# Patient Record
Sex: Male | Born: 1960 | Race: White | Hispanic: No | Marital: Married | Smoking: Never smoker
Health system: Southern US, Community
[De-identification: ages and names within clinical notes are randomized; demographics above are authoritative.]

## PROBLEM LIST (undated history)

## (undated) DIAGNOSIS — I5022 Chronic systolic (congestive) heart failure: Secondary | ICD-10-CM

## (undated) DIAGNOSIS — D649 Anemia, unspecified: Secondary | ICD-10-CM

## (undated) DIAGNOSIS — I2510 Atherosclerotic heart disease of native coronary artery without angina pectoris: Secondary | ICD-10-CM

## (undated) DIAGNOSIS — I872 Venous insufficiency (chronic) (peripheral): Secondary | ICD-10-CM

## (undated) DIAGNOSIS — I255 Ischemic cardiomyopathy: Secondary | ICD-10-CM

## (undated) DIAGNOSIS — A498 Other bacterial infections of unspecified site: Secondary | ICD-10-CM

## (undated) DIAGNOSIS — R51 Headache: Secondary | ICD-10-CM

## (undated) DIAGNOSIS — M86661 Other chronic osteomyelitis, right tibia and fibula: Secondary | ICD-10-CM

## (undated) DIAGNOSIS — IMO0001 Reserved for inherently not codable concepts without codable children: Secondary | ICD-10-CM

## (undated) DIAGNOSIS — H3520 Other non-diabetic proliferative retinopathy, unspecified eye: Secondary | ICD-10-CM

## (undated) DIAGNOSIS — K2970 Gastritis, unspecified, without bleeding: Secondary | ICD-10-CM

## (undated) DIAGNOSIS — B029 Zoster without complications: Secondary | ICD-10-CM

## (undated) DIAGNOSIS — I219 Acute myocardial infarction, unspecified: Secondary | ICD-10-CM

## (undated) DIAGNOSIS — E785 Hyperlipidemia, unspecified: Secondary | ICD-10-CM

## (undated) DIAGNOSIS — A491 Streptococcal infection, unspecified site: Secondary | ICD-10-CM

## (undated) DIAGNOSIS — R519 Headache, unspecified: Secondary | ICD-10-CM

## (undated) DIAGNOSIS — N182 Chronic kidney disease, stage 2 (mild): Secondary | ICD-10-CM

## (undated) DIAGNOSIS — I10 Essential (primary) hypertension: Secondary | ICD-10-CM

## (undated) HISTORY — PX: FRACTURE SURGERY: SHX138

## (undated) HISTORY — DX: Anemia, unspecified: D64.9

## (undated) HISTORY — DX: Other bacterial infections of unspecified site: A49.8

## (undated) HISTORY — DX: Gastritis, unspecified, without bleeding: K29.70

## (undated) HISTORY — DX: Chronic kidney disease, stage 2 (mild): N18.2

## (undated) HISTORY — DX: Essential (primary) hypertension: I10

## (undated) HISTORY — DX: Hyperlipidemia, unspecified: E78.5

## (undated) HISTORY — DX: Chronic systolic (congestive) heart failure: I50.22

## (undated) HISTORY — DX: Other chronic osteomyelitis, right tibia and fibula: M86.661

## (undated) HISTORY — DX: Ischemic cardiomyopathy: I25.5

## (undated) HISTORY — DX: Zoster without complications: B02.9

## (undated) HISTORY — DX: Venous insufficiency (chronic) (peripheral): I87.2

## (undated) HISTORY — DX: Streptococcal infection, unspecified site: A49.1

## (undated) HISTORY — PX: REFRACTIVE SURGERY: SHX103

## (undated) HISTORY — DX: Atherosclerotic heart disease of native coronary artery without angina pectoris: I25.10

---
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
## 1993-11-01 HISTORY — PX: VARICOSE VEIN SURGERY: SHX832

## 2001-11-01 DIAGNOSIS — B029 Zoster without complications: Secondary | ICD-10-CM

## 2001-11-01 HISTORY — DX: Zoster without complications: B02.9

## 2008-09-20 ENCOUNTER — Ambulatory Visit: Payer: Self-pay | Admitting: Orthopedic Surgery

## 2010-10-31 ENCOUNTER — Inpatient Hospital Stay: Payer: Self-pay | Admitting: Internal Medicine

## 2010-11-01 DIAGNOSIS — I2510 Atherosclerotic heart disease of native coronary artery without angina pectoris: Secondary | ICD-10-CM

## 2010-11-01 HISTORY — DX: Atherosclerotic heart disease of native coronary artery without angina pectoris: I25.10

## 2010-11-05 HISTORY — PX: CORONARY ARTERY BYPASS GRAFT: SHX141

## 2011-04-21 ENCOUNTER — Other Ambulatory Visit: Payer: Self-pay | Admitting: Emergency Medicine

## 2011-04-21 ENCOUNTER — Ambulatory Visit (INDEPENDENT_AMBULATORY_CARE_PROVIDER_SITE_OTHER): Payer: Private Health Insurance - Indemnity | Admitting: Internal Medicine

## 2011-04-21 ENCOUNTER — Encounter: Payer: Self-pay | Admitting: Internal Medicine

## 2011-04-21 VITALS — BP 94/58 | HR 90 | Ht 71.0 in | Wt 188.8 lb

## 2011-04-21 DIAGNOSIS — I5022 Chronic systolic (congestive) heart failure: Secondary | ICD-10-CM | POA: Insufficient documentation

## 2011-04-21 DIAGNOSIS — E785 Hyperlipidemia, unspecified: Secondary | ICD-10-CM | POA: Insufficient documentation

## 2011-04-21 DIAGNOSIS — I2510 Atherosclerotic heart disease of native coronary artery without angina pectoris: Secondary | ICD-10-CM | POA: Insufficient documentation

## 2011-04-21 DIAGNOSIS — E781 Pure hyperglyceridemia: Secondary | ICD-10-CM | POA: Insufficient documentation

## 2011-04-21 MED ORDER — SPIRONOLACTONE 25 MG PO TABS: 12.5000 mg | ORAL_TABLET | Freq: Every day | ORAL | Status: DC

## 2011-04-21 MED ORDER — SIMVASTATIN 40 MG PO TABS: 40.0000 mg | ORAL_TABLET | Freq: Every day | ORAL | Status: DC

## 2011-04-21 MED ORDER — DIGOXIN 125 MCG PO TABS: 125.0000 ug | ORAL_TABLET | Freq: Every day | ORAL | Status: DC

## 2011-04-21 NOTE — Assessment & Plan Note (Signed)
Goal LDL < 70. Continue simva.

## 2011-04-21 NOTE — Assessment & Plan Note (Addendum)
Doing well. NYHA I. Minimal volume overload. I did bedside echo myself in clinic today and EF 25-30% with mild MR. I suggested ICD but he wants to defer for now. Understands the risk of SCD. Medication titration limited by hypotension. Will add digoxin 0.125qd and also try to start spiro 12.5daily. Check BMET 1 week. See back frequently to try and titrate doses as tolerated.

## 2011-04-21 NOTE — Progress Notes (Signed)
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  Had a NSTEMI in January 2012 after presenting with bad cough. Had cath at [facility]. LAD 100% LCX 70% RCA p70%, d100%. EF 25%. Cardiac MRI: EF 15% anterior wall and inferior wall infarct with significant viability.   Had 3-v CABG on 11/05/10 with LIMA - LAD, SVG-OM2, SVG-PDA. 1/12  Since discharge doing well. 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 Did not go to cardiac rehab. Walks back and forth between 2 buildings 300 yards apart all day without problem. Can go up 32 steps to see boss without problem. Chest sore but no angina. No orthopnea or PND. No palpitations, orthopnea, PND or edema. Could not tolerate lisinopril due to symptomatic hypotension. SBP now stays in 100 range.  Takes lasix about once a week for swelling.    ROS: All other systems normal except as mentioned in HPI, past medical history and problem list.    Past Medical History  Diagnosis Date  . Coronary artery disease   . Diabetes mellitus   . Hyperlipidemia   . Ischemic cardiomyopathy   . Chronic renal insufficiency     Current Outpatient Prescriptions  Medication Sig Dispense Refill  . Acetaminophen 500 MG coapsule Take 500 mg by mouth every 4 (four) hours as needed.        Marland Kitchen aspirin 81 MG tablet Take 81 mg by mouth daily.        . furosemide (LASIX) 20 MG tablet Take 20 mg tablet as needed for increased weight gain.       Marland Kitchen glimepiride (AMARYL) 4 MG tablet 4 mg. Take two tablets by mouth daily.       . metoprolol succinate (TOPROL-XL) 25 MG 24 hr tablet Take 25 mg by mouth daily.        . pseudoephedrine (SUDAFED) 30 MG tablet 30 mg. Take one tablet by mouth twice a day as needed.       . simvastatin (ZOCOR) 40 MG tablet Take 40 mg by mouth at bedtime.          No Known Allergies  History   Social History  . Marital Status: Married   Spouse Name: N/A    Number of Children: N/A  . Years of Education: N/A   Occupational History  . Not on file.   Social History Main Topics  . Smoking status: Never Smoker   . Smokeless tobacco: Never Used  . Alcohol Use: No  . Drug Use: No  . Sexually Active: Not on file   Other Topics Concern  . Not on file   Social History Narrative  . No narrative on file    Family History  Problem Relation Age of Onset  . Hypertension Father     PHYSICAL EXAM: Filed Vitals:   04/21/11 1624  BP: 102/67  Pulse: 90   General:  Well appearing. No respiratory difficulty HEENT: normal Neck: supple. no JVD. Carotids 2+ bilat; no bruits. No lymphadenopathy or thryomegaly appreciated. Cor: PMI nondisplaced. Regular rate & rhythm. No rubs, gallops or murmurs. Lungs: clear Abdomen: soft, nontender, nondistended. No hepatosplenomegaly. No bruits or masses. Good bowel sounds. Extremities: no cyanosis, clubbing, rash, 1-2+ edema + scars from healed ulcers on right Neuro: alert & oriented x 3, cranial nerves grossly intact. moves all 4 extremities w/o difficulty. Odd affect  ECG: NSR  90. TWI I,L mild ST scooping laterally  ASSESSMENT & PLAN

## 2011-04-21 NOTE — Patient Instructions (Addendum)
Start new medications: Spironolactone 25mg  1/2 tablet once daily, Digoxin 0.125mg  1 tablet once daily. Follow-up in 1 month

## 2011-04-21 NOTE — Assessment & Plan Note (Signed)
No evidence of ischemia. Continue current regimen.   

## 2011-05-03 ENCOUNTER — Encounter: Payer: Self-pay | Admitting: Internal Medicine

## 2011-05-24 ENCOUNTER — Ambulatory Visit: Payer: Private Health Insurance - Indemnity | Admitting: Physician Assistant

## 2011-06-09 ENCOUNTER — Ambulatory Visit (INDEPENDENT_AMBULATORY_CARE_PROVIDER_SITE_OTHER): Payer: Private Health Insurance - Indemnity | Admitting: Internal Medicine

## 2011-06-09 ENCOUNTER — Encounter: Payer: Self-pay | Admitting: Internal Medicine

## 2011-06-09 DIAGNOSIS — I2510 Atherosclerotic heart disease of native coronary artery without angina pectoris: Secondary | ICD-10-CM

## 2011-06-09 DIAGNOSIS — I5022 Chronic systolic (congestive) heart failure: Secondary | ICD-10-CM

## 2011-06-09 MED ORDER — METOPROLOL SUCCINATE ER 50 MG PO TB24: 50.0000 mg | ORAL_TABLET | Freq: Every day | ORAL | Status: DC

## 2011-06-09 NOTE — Assessment & Plan Note (Signed)
No evidence of ischemia. Continue current regimen.   

## 2011-06-09 NOTE — Assessment & Plan Note (Addendum)
Doing well. NYHA I.   EF 25-30% with mild MR. I suggested ICD but hecontinues to defer for now. Understands the risk of SCD.Increase Metoprolol XL 25mg  BID. Increase See back frequently to try and titrate doses as tolerated.

## 2011-06-09 NOTE — Patient Instructions (Signed)
Increase Toprol to 50 mg every evening  Your physician has requested that you have an echocardiogram. Echocardiography is a painless test that uses sound waves to create images of your heart. It provides your doctor with information about the size and shape of your heart and how well your heart's chambers and valves are working. This procedure takes approximately one hour. There are no restrictions for this procedure.  In 6 weeks  Your physician recommends that you schedule a follow-up appointment in: 6 weeks in Heart Failure clinic

## 2011-06-09 NOTE — Progress Notes (Signed)
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  Had a NSTEMI in January 2012 after presenting with bad cough. Had cath at [facility]. LAD 100% LCX 70% RCA p70%, d100%. EF 25%. Cardiac MRI: EF 15% anterior wall and inferior wall infarct with significant viability.   Had 3-v CABG on 11/05/10 with LIMA - LAD, SVG-OM2, SVG-PDA. 1/12  I saw him in [place] office in June 2012. Quick-look echo with persistent LV dysfunction EF 25%.  He is doing well no complaints. 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 Goes back and forth between 2 buildings 300 yards apart all day without problem. Can go up 32 steps to see boss without problem. No has mild soreness from CABG. No orthopnea or PND. No palpitations, orthopnea, PND or edema. Could not tolerate lisinopril due to symptomatic hypotension. He has not required extra Lasix because his weight has been stable and he has not had any swelling. Follows low salt diet 05-03-2011  K 4.8 BUN 31Creat1.09   ROS: All other systems normal except as mentioned in HPI, past medical history and problem list.    Past Medical History  Diagnosis Date  . Coronary artery disease   . Diabetes mellitus   . Hyperlipidemia   . Ischemic cardiomyopathy   . Chronic renal insufficiency     Current Outpatient Prescriptions  Medication Sig Dispense Refill  . Acetaminophen 500 MG coapsule Take 500 mg by mouth every 4 (four) hours as needed.        Marland Kitchen aspirin 81 MG tablet Take 81 mg by mouth daily.        . digoxin (LANOXIN) 0.125 MG tablet Take 1 tablet (125 mcg total) by mouth daily.  30 tablet  6  . furosemide (LASIX) 20 MG tablet Take 20 mg tablet as needed for increased weight gain.       Marland Kitchen glimepiride (AMARYL) 4 MG tablet 4 mg. Take two tablets by mouth daily.       . metoprolol succinate (TOPROL-XL) 25 MG 24  hr tablet Take 25 mg by mouth daily.        Marland Kitchen NAPROXEN PO Take by mouth as needed.        . pseudoephedrine (SUDAFED) 30 MG tablet 30 mg. Take one tablet by mouth twice a day as needed.       . simvastatin (ZOCOR) 40 MG tablet Take 1 tablet (40 mg total) by mouth at bedtime.  30 tablet  6  . spironolactone (ALDACTONE) 25 MG tablet Take 0.5 tablets (12.5 mg total) by mouth daily.  30 tablet  6    No Known Allergies  History   Social History  . Marital Status: Married    Spouse Name: N/A    Number of Children: N/A  . Years of Education: N/A   Occupational History  . Not on file.   Social History Main Topics  . Smoking status: Never Smoker   . Smokeless tobacco: Never Used  . Alcohol Use: No  . Drug Use: No  . Sexually Active: Not on file   Other Topics Concern  . Not on file   Social History Narrative  . No narrative on file    Family History  Problem Relation Age of Onset  . Hypertension Father     PHYSICAL EXAM:  Filed Vitals:   06/09/11 1603  BP: 142/68  Pulse: 80   General:  Well appearing. No respiratory difficulty HEENT: normal Neck: supple. JVP 5. Carotids 2+ bilat; no bruits. No lymphadenopathy or thryomegaly appreciated. Cor: PMI nondisplaced. Regular rate & rhythm. No rubs, gallops or murmurs. Lungs: clear Abdomen: soft, nontender, nondistended. No hepatosplenomegaly. No bruits or masses. Good bowel sounds. Extremities: no cyanosis, clubbing, rash,RLE  LLE1-2+ edema + scars from healed ulcers on right Neuro: alert & oriented x 3, cranial nerves grossly intact. moves all 4 extremities w/o difficulty. Odd affect  ECG:   ASSESSMENT & PLAN

## 2011-06-21 ENCOUNTER — Other Ambulatory Visit (HOSPITAL_COMMUNITY): Payer: Private Health Insurance - Indemnity

## 2011-07-22 ENCOUNTER — Ambulatory Visit (HOSPITAL_COMMUNITY)
Admission: RE | Admit: 2011-07-22 | Discharge: 2011-07-22 | Disposition: A | Discharge status: Home / Self Care | Payer: Private Health Insurance - Indemnity | Source: Ambulatory Visit | Attending: Internal Medicine | Admitting: Internal Medicine

## 2011-07-22 VITALS — BP 112/66 | HR 65 | Wt 186.5 lb

## 2011-07-22 DIAGNOSIS — I079 Rheumatic tricuspid valve disease, unspecified: Secondary | ICD-10-CM | POA: Insufficient documentation

## 2011-07-22 DIAGNOSIS — I252 Old myocardial infarction: Secondary | ICD-10-CM | POA: Insufficient documentation

## 2011-07-22 DIAGNOSIS — E119 Type 2 diabetes mellitus without complications: Secondary | ICD-10-CM | POA: Insufficient documentation

## 2011-07-22 DIAGNOSIS — I509 Heart failure, unspecified: Secondary | ICD-10-CM

## 2011-07-22 DIAGNOSIS — E785 Hyperlipidemia, unspecified: Secondary | ICD-10-CM | POA: Insufficient documentation

## 2011-07-22 DIAGNOSIS — I5022 Chronic systolic (congestive) heart failure: Secondary | ICD-10-CM | POA: Insufficient documentation

## 2011-07-22 MED ORDER — LISINOPRIL 2.5 MG PO TABS: 2.5000 mg | ORAL_TABLET | Freq: Every day | ORAL | Status: DC

## 2011-07-22 NOTE — Assessment & Plan Note (Addendum)
Doing very well. NYHA II. Volume status looks good. Will try to reinitiate low-dose lisinopril at 2.5 daily. Reviewed use of sliding scale lasix.

## 2011-07-22 NOTE — Progress Notes (Signed)
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  Had a NSTEMI in January 2012 after presenting with bad cough. Had cath at [facility]. LAD 100% LCX 70% RCA p70%, d100%. EF 25%. Cardiac MRI: EF 15% anterior wall and inferior wall infarct with significant viability.   Had 3-v CABG on 11/05/10 with LIMA - LAD, SVG-OM2, SVG-PDA. 1/12  I saw him in [place] office in June 2012. Quick-look echo with persistent LV dysfunction EF 25%.  At last visit increased Toprol 50. Tolerating well.   Returns today for routine f/u. Echo today which I reviewed shows EF 25%.   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 Goes back and forth between 2 buildings 300 yards apart all day without problem. Can go up 32 steps to see boss without problem. No orthopnea or PND. No palpitations, orthopnea, PND or edema. Could not tolerate lisinopril due to symptomatic hypotension. He has not required extra Lasix because his weight has been stable and he has not had any swelling. Follows low salt diet. No dizziness at all.    ROS: All other systems normal except as mentioned in HPI, past medical history and problem list.    Past Medical History  Diagnosis Date  . Coronary artery disease   . Diabetes mellitus   . Hyperlipidemia   . Ischemic cardiomyopathy   . Chronic renal insufficiency     Current Outpatient Prescriptions  Medication Sig Dispense Refill  . Acetaminophen 500 MG coapsule Take 500 mg by mouth every 4 (four) hours as needed.        Marland Kitchen aspirin 81 MG tablet Take 81 mg by mouth daily.        . digoxin (LANOXIN) 0.125 MG tablet Take 1 tablet (125 mcg total) by mouth daily.  30 tablet  6  . furosemide (LASIX) 20 MG tablet Take 20 mg tablet as needed for increased weight gain.       Marland Kitchen glimepiride (AMARYL) 4 MG tablet 4 mg. Take two  tablets by mouth daily.       . metoprolol succinate (TOPROL-XL) 50 MG 24 hr tablet Take 1 tablet (50 mg total) by mouth daily.  30 tablet  6  . NAPROXEN PO Take by mouth as needed.        . pseudoephedrine (SUDAFED) 30 MG tablet 30 mg. Take one tablet by mouth twice a day as needed.       . simvastatin (ZOCOR) 40 MG tablet Take 1 tablet (40 mg total) by mouth at bedtime.  30 tablet  6  . spironolactone (ALDACTONE) 25 MG tablet Take 0.5 tablets (12.5 mg total) by mouth daily.  30 tablet  6    No Known Allergies  History   Social History  . Marital Status: Married    Spouse Name: N/A    Number of Children: N/A  . Years of Education: N/A   Occupational History  . Not on file.   Social History Main Topics  . Smoking status: Never Smoker   . Smokeless tobacco: Never Used  . Alcohol Use: No  . Drug Use: No  . Sexually Active: Not on file   Other Topics Concern  . Not on file   Social History Narrative  . No narrative on file    Family History  Problem Relation Age of Onset  . Hypertension Father     PHYSICAL EXAM: Filed Vitals:   07/22/11 1112  BP: 112/66  Pulse: 65   General:  Well appearing. No respiratory difficulty HEENT: normal Neck: supple. JVP 5. Carotids 2+ bilat; no bruits. No lymphadenopathy or thryomegaly appreciated. Cor: PMI nondisplaced. Regular rate & rhythm. No rubs, gallops or murmurs. Lungs: clear Abdomen: soft, nontender, nondistended. No hepatosplenomegaly. No bruits or masses. Good bowel sounds. Extremities: no cyanosis, clubbing, rash,RLE  LLE1-2+ edema + scars from healed ulcers on right Neuro: alert & oriented x 3, cranial nerves grossly intact. moves all 4 extremities w/o difficulty. Odd affect    ASSESSMENT & PLAN

## 2011-07-22 NOTE — Patient Instructions (Signed)
Start Lisinopril 2.5 mg daily  Your physician recommends that you return for lab work in: 2 weeks (bmet)  Your physician recommends that you schedule a follow-up appointment in: 2 months.

## 2011-09-06 ENCOUNTER — Encounter: Payer: Self-pay | Admitting: Internal Medicine

## 2011-09-30 ENCOUNTER — Ambulatory Visit (HOSPITAL_COMMUNITY)
Admission: RE | Admit: 2011-09-30 | Discharge: 2011-09-30 | Disposition: A | Discharge status: Home / Self Care | Payer: Private Health Insurance - Indemnity | Source: Ambulatory Visit | Attending: Internal Medicine | Admitting: Internal Medicine

## 2011-09-30 DIAGNOSIS — I5022 Chronic systolic (congestive) heart failure: Secondary | ICD-10-CM | POA: Insufficient documentation

## 2011-09-30 NOTE — Patient Instructions (Signed)
Follow in 3 months for ECHO and appointment  Weigh yourself EVERY morning after you go to the bathroom but before you eat or drink anything. Write this number down in a weight log/diary. If you gain 3 pounds overnight or 5 pounds in a week, call the heart failure clinic

## 2011-09-30 NOTE — Assessment & Plan Note (Addendum)
NYHA I. Volume status stable. He declines defibrillator.Intolerant ACE inhibitor.  Will need formal ECHO in 3 months.  Follow up in 3 months.

## 2011-09-30 NOTE — Progress Notes (Signed)
Patient ID: Vincent Brown, male   DOB: June 09, 1961, 50 y.o.   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  Had a NSTEMI in January 2012 after presenting with bad cough. Had cath at [facility]. LAD 100% LCX 70% RCA p70%, d100%. EF 25%. Cardiac MRI: EF 15% anterior wall and inferior wall infarct with significant viability.   Had 3-v CABG on 11/05/10 with LIMA - LAD, SVG-OM2, SVG-PDA. 1/12  I saw him in [place] office in June 2012. Quick-look echo with persistent LV dysfunction EF 25%.  He is here for follow up . Denies SOB/PND/Orthpnea. Last visit started Lisinopril but stopped after a week and because he felt like he staggered. Tried to take Lisinopril at night.   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 Goes back and forth between 2 buildings 300 yards apart all day without problem. Can go up 32 steps to see boss without problem. He takes Lasix about once a month.  Follows low salt diet. Chronic lower extremity edema R> L. Declines defibrillator.     ROS: All other systems normal except as mentioned in HPI, past medical history and problem list.    Past Medical History  Diagnosis Date  . Coronary artery disease   . Diabetes mellitus   . Hyperlipidemia   . Ischemic cardiomyopathy   . Chronic renal insufficiency     Current Outpatient Prescriptions  Medication Sig Dispense Refill  . Acetaminophen 500 MG coapsule Take 500 mg by mouth every 4 (four) hours as needed.        Marland Kitchen aspirin 81 MG tablet Take 81 mg by mouth daily.        . digoxin (LANOXIN) 0.125 MG tablet Take 1 tablet (125 mcg total) by mouth daily.  30 tablet  6  . furosemide (LASIX) 20 MG tablet Take 20 mg tablet as needed for increased weight gain.       Marland Kitchen glimepiride (AMARYL) 4 MG tablet Take two tablets by mouth daily.      . metoprolol succinate (TOPROL-XL) 50 MG 24  hr tablet Take 1 tablet (50 mg total) by mouth daily.  30 tablet  6  . NAPROXEN PO Take by mouth as needed.        . pseudoephedrine (SUDAFED) 30 MG tablet 30 mg. Take one tablet by mouth twice a day as needed.       . simvastatin (ZOCOR) 40 MG tablet Take 1 tablet (40 mg total) by mouth at bedtime.  30 tablet  6  . spironolactone (ALDACTONE) 25 MG tablet Take 0.5 tablets (12.5 mg total) by mouth daily.  30 tablet  6    No Known Allergies  History   Social History  . Marital Status: Married    Spouse Name: N/A    Number of Children: N/A  . Years of Education: N/A   Occupational History  . Not on file.   Social History Main Topics  . Smoking status: Never Smoker   . Smokeless tobacco: Never Used  . Alcohol Use: No  . Drug Use: No  . Sexually Active: Not on file   Other Topics Concern  . Not on file   Social History Narrative  . No narrative on file    Family History  Problem Relation Age of Onset  . Hypertension Father  PHYSICAL EXAM: Filed Vitals:   09/30/11 1544  BP: 106/58  Pulse: 70   General:  Well appearing. No respiratory difficulty HEENT: normal Neck: supple. JVP 6-7. Carotids 2+ bilat; no bruits. No lymphadenopathy or thryomegaly appreciated. Cor: PMI nondisplaced. Regular rate & rhythm. No rubs, gallops or murmurs. Lungs: clear Abdomen: soft, nontender, nondistended. No hepatosplenomegaly. No bruits or masses. Good bowel sounds. Extremities: no cyanosis, clubbing, rash,RLE  LLE1 edema + scars from healed ulcers on right Neuro: alert & oriented x 3, cranial nerves grossly intact. moves all 4 extremities w/o difficulty. Odd affect    ASSESSMENT & PLAN

## 2011-10-01 NOTE — Progress Notes (Signed)
Patient seen and examined with Amy Clegg, NP. We discussed all aspects of the encounter. I agree with the assessment and plan as stated above.   

## 2011-11-25 ENCOUNTER — Other Ambulatory Visit: Payer: Self-pay | Admitting: Internal Medicine

## 2012-01-20 ENCOUNTER — Ambulatory Visit (HOSPITAL_COMMUNITY)
Admission: RE | Admit: 2012-01-20 | Discharge: 2012-01-20 | Disposition: A | Discharge status: Home / Self Care | Payer: Private Health Insurance - Indemnity | Source: Ambulatory Visit | Attending: Internal Medicine | Admitting: Internal Medicine

## 2012-01-20 VITALS — BP 116/56 | HR 80 | Wt 181.0 lb

## 2012-01-20 DIAGNOSIS — I509 Heart failure, unspecified: Secondary | ICD-10-CM | POA: Insufficient documentation

## 2012-01-20 DIAGNOSIS — E119 Type 2 diabetes mellitus without complications: Secondary | ICD-10-CM | POA: Insufficient documentation

## 2012-01-20 DIAGNOSIS — I5022 Chronic systolic (congestive) heart failure: Secondary | ICD-10-CM

## 2012-01-20 DIAGNOSIS — I369 Nonrheumatic tricuspid valve disorder, unspecified: Secondary | ICD-10-CM

## 2012-01-20 DIAGNOSIS — E785 Hyperlipidemia, unspecified: Secondary | ICD-10-CM | POA: Insufficient documentation

## 2012-01-20 MED ORDER — SPIRONOLACTONE 25 MG PO TABS: 12.5000 mg | ORAL_TABLET | Freq: Every day | ORAL | Status: DC

## 2012-01-20 MED ORDER — METOPROLOL SUCCINATE ER 50 MG PO TB24: ORAL_TABLET | ORAL | Status: DC

## 2012-01-20 MED ORDER — DIGOXIN 125 MCG PO TABS: 125.0000 ug | ORAL_TABLET | Freq: Every day | ORAL | Status: DC

## 2012-01-20 MED ORDER — SIMVASTATIN 40 MG PO TABS: 40.0000 mg | ORAL_TABLET | Freq: Every day | ORAL | Status: DC

## 2012-01-20 MED ORDER — FUROSEMIDE 20 MG PO TABS: ORAL_TABLET | ORAL | Status: DC

## 2012-01-20 NOTE — Assessment & Plan Note (Signed)
No evidence of ischemia. Continue current regimen.   

## 2012-01-20 NOTE — Progress Notes (Signed)
  Echocardiogram 2D Echocardiogram has been performed.  Cathie Beams Deneen 01/20/2012, 3:21 PM

## 2012-01-20 NOTE — Patient Instructions (Signed)
Increase metoprolol to 75 mg daily (1.5 tabs)  Follow up 6 months.  Do the following things EVERYDAY: 1) Weigh yourself in the morning before breakfast. Write it down and keep it in a log. 2) Take your medicines as prescribed 3) Eat low salt foods--Limit salt (sodium) to 2000mg  per day.  4) Stay as active as you can everyday

## 2012-01-20 NOTE — Progress Notes (Signed)
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  Had a NSTEMI in January 2012 after presenting with bad cough. Had cath at [facility]. LAD 100% LCX 70% RCA p70%, d100%. EF 25%. Cardiac MRI: EF 15% anterior wall and inferior wall infarct with significant viability.   Had 3-V CABG on 11/05/10 with LIMA - LAD, SVG-OM2, SVG-PDA.   I saw him in [place] office in June 2012. Quick-look echo with persistent LV dysfunction EF 25%.  Unable to tolerate lisinopril due to dizziness.  He is here for follow up. He has been doing well.  Denies SOB/orthopnea/PND.  Takes lasix about once every week or so.  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  Follows a low salt diet, doing well with it right now.  Continues to decline defib.  Reviewed Echo today, preliminary report EF 30-35%   ROS: All other systems normal except as mentioned in HPI, past medical history and problem list.    Past Medical History  Diagnosis Date  . Coronary artery disease   . Diabetes mellitus   . Hyperlipidemia   . Ischemic cardiomyopathy   . Chronic renal insufficiency     Current Outpatient Prescriptions  Medication Sig Dispense Refill  . Acetaminophen 500 MG coapsule Take 500 mg by mouth every 4 (four) hours as needed.        Marland Kitchen aspirin 81 MG tablet Take 81 mg by mouth daily.        . digoxin (LANOXIN) 0.125 MG tablet take 1 tablet by mouth once daily  30 tablet  6  . furosemide (LASIX) 20 MG tablet Take 20 mg tablet as needed for increased weight gain.       Marland Kitchen glimepiride (AMARYL) 4 MG tablet Take two tablets by mouth daily.      . metoprolol succinate (TOPROL-XL) 50 MG 24 hr tablet Take 1 tablet (50 mg total) by mouth daily.  30 tablet  6  . NAPROXEN PO Take by mouth as needed.        . simvastatin (ZOCOR) 40 MG tablet Take 1 tablet (40 mg total) by mouth at bedtime.  30 tablet  6  . spironolactone  (ALDACTONE) 25 MG tablet Take 0.5 tablets (12.5 mg total) by mouth daily.  30 tablet  6    No Known Allergies   PHYSICAL EXAM: Filed Vitals:   01/20/12 1534  BP: 116/56  Pulse: 80  Weight: 181 lb (82.101 kg)  SpO2: 97%   General:  Well appearing. No respiratory difficulty HEENT: normal Neck: supple. JVP 6-7. Carotids 2+ bilat; no bruits. No lymphadenopathy or thryomegaly appreciated. Cor: PMI nondisplaced. Regular rate & rhythm. No rubs, gallops or murmurs. Lungs: clear Abdomen: soft, nontender, nondistended. No hepatosplenomegaly. No bruits or masses. Good bowel sounds. Extremities: no cyanosis, clubbing, rash, trace bilateral LE edema.   Neuro: alert & oriented x 3, cranial nerves grossly intact. moves all 4 extremities w/o difficulty. Odd affect    ASSESSMENT & PLAN

## 2012-01-20 NOTE — Assessment & Plan Note (Addendum)
NYHA I. Volume status looks good today.  Reviewed echo results during the office visit today.  His LVEF remains low 30-35%.  Discussed risk/benefits and indications for ICD placement but he again declines.  Will not add ACE-I due to intolerance.  Will increase Toprol to 75 mg daily.  Follow up 6 months.   Patient seen and examined with Ulyess Blossom PA-C. We discussed all aspects of the encounter. I agree with the assessment and plan as stated above.  Remains fairly active. Looks good on exam. Refuses ICD, re-attempt at ACE-I or f/u closer than 6 months despite our requests to the contrary. Will see back in 6 months. Knows to call us if he is having a problem.

## 2012-09-26 ENCOUNTER — Ambulatory Visit (HOSPITAL_COMMUNITY): Payer: Private Health Insurance - Indemnity

## 2012-10-01 HISTORY — PX: COLONOSCOPY: SHX174

## 2012-10-09 ENCOUNTER — Encounter (HOSPITAL_COMMUNITY): Payer: Self-pay

## 2012-10-09 ENCOUNTER — Ambulatory Visit (HOSPITAL_COMMUNITY)
Admission: RE | Admit: 2012-10-09 | Discharge: 2012-10-09 | Disposition: A | Discharge status: Home / Self Care | Payer: Private Health Insurance - Indemnity | Source: Ambulatory Visit | Attending: Internal Medicine | Admitting: Internal Medicine

## 2012-10-09 VITALS — BP 112/58 | HR 67 | Ht 71.0 in | Wt 170.8 lb

## 2012-10-09 DIAGNOSIS — I5022 Chronic systolic (congestive) heart failure: Secondary | ICD-10-CM | POA: Insufficient documentation

## 2012-10-09 DIAGNOSIS — I2510 Atherosclerotic heart disease of native coronary artery without angina pectoris: Secondary | ICD-10-CM

## 2012-10-09 DIAGNOSIS — Z01810 Encounter for preprocedural cardiovascular examination: Secondary | ICD-10-CM

## 2012-10-09 LAB — BASIC METABOLIC PANEL
BUN: 24 mg/dL — ABNORMAL HIGH (ref 6–23)
GFR calc Af Amer: >90 mL/min (ref >90)
GFR calc non Af Amer: >90 mL/min (ref >90)
Potassium: 4.8 mEq/L (ref 3.5–5.1)
Sodium: 135 mEq/L (ref 135–145)

## 2012-10-09 MED ORDER — SPIRONOLACTONE 25 MG PO TABS: 25.0000 mg | ORAL_TABLET | Freq: Every day | ORAL | Status: DC

## 2012-10-09 MED ORDER — FUROSEMIDE 20 MG PO TABS: ORAL_TABLET | ORAL | Status: DC

## 2012-10-09 MED ORDER — SIMVASTATIN 40 MG PO TABS: 40.0000 mg | ORAL_TABLET | Freq: Every day | ORAL | Status: DC

## 2012-10-09 MED ORDER — METOPROLOL SUCCINATE ER 50 MG PO TB24: ORAL_TABLET | ORAL | Status: DC

## 2012-10-09 NOTE — Patient Instructions (Addendum)
Continue taking medications as prescribed.  Follow up April 2014, with ECHO  Will call with lab results.  Stop taking Digoxin and increase Spironolactone to 25 mg daily.  Call any issues

## 2012-10-09 NOTE — Assessment & Plan Note (Signed)
Given well preserved functional capacity, colonoscopy likely low risk. OK to proceed without further cardiac work-up. Watch volume status peri-procedure.

## 2012-10-09 NOTE — Progress Notes (Signed)
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  Had a NSTEMI in January 2012 after presenting with bad cough. Had cath at [facility]. LAD 100% LCX 70% RCA p70%, d100%. EF 25%. Cardiac MRI: EF 15% anterior wall and inferior wall infarct with significant viability.   Had 3-V CABG on 11/05/10 with LIMA - LAD, SVG-OM2, SVG-PDA.   Unable to tolerate lisinopril due to dizziness.  ECHO 12/2011: EF 25-30%  Follow up: Last visit increased his Toprol to 75 mg daily and has tolerated well. Denies SOB/orthopnea/PND. Reports LE edema every once in awhile. 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 Reports he gets SOB and fatigued after moving 100 lb bales of hay, but after resting a few minutes he is ok. Eats low salt, low fat, and low sugar diet. Taking medications as prescribed and weight at home 170-172 lbs. Having colonoscopy 12/30 and needs cardiac clearance.     ROS: All other systems normal except as mentioned in HPI, past medical history and problem list.    Past Medical History  Diagnosis Date  . Coronary artery disease   . Diabetes mellitus   . Hyperlipidemia   . Ischemic cardiomyopathy   . Chronic renal insufficiency     Current Outpatient Prescriptions  Medication Sig Dispense Refill  . Acetaminophen 500 MG coapsule Take 500 mg by mouth every 4 (four) hours as needed.        Marland Kitchen aspirin 81 MG tablet Take 81 mg by mouth daily.        . digoxin (LANOXIN) 0.125 MG tablet Take 1 tablet (125 mcg total) by mouth daily.  30 tablet  6  . furosemide (LASIX) 20 MG tablet Take 20 mg tablet as needed for increased weight gain.  20 tablet  6  . glimepiride (AMARYL) 4 MG tablet Take two tablets by mouth daily.      . metoprolol succinate (TOPROL-XL) 50 MG 24 hr tablet Take 75 mg (1.5 tabs) daily.  45 tablet  6  . NAPROXEN PO Take by mouth  as needed.        . simvastatin (ZOCOR) 40 MG tablet Take 1 tablet (40 mg total) by mouth at bedtime.  30 tablet  6  . spironolactone (ALDACTONE) 25 MG tablet Take 0.5 tablets (12.5 mg total) by mouth daily.  30 tablet  6    No Known Allergies   PHYSICAL EXAM: Filed Vitals:   10/09/12 0856  BP: 112/58  Pulse: 67  Height: 5\' 11"  (1.803 m)  Weight: 170 lb 12.8 oz (77.474 kg)  SpO2: 97%   General:  Well appearing. No respiratory difficulty HEENT: normal Neck: supple. JVP 7-8. Carotids 2+ bilat; no bruits. No lymphadenopathy or thryomegaly appreciated. Cor: PMI nondisplaced. Regular rate & rhythm., No rubs, gallops or murmurs Lungs: CTA all lung fields Abdomen: soft, nontender, nondistended. No hepatosplenomegaly. No bruits or masses. Good bowel sounds. Extremities: no cyanosis, clubbing, rash, trace bilateral LE edema.   Neuro: alert & oriented x 3, cranial nerves grossly intact. moves all 4 extremities w/o difficulty.    ASSESSMENT & PLAN

## 2012-10-09 NOTE — Assessment & Plan Note (Signed)
[name], NP. We discussed all aspects of the encounter. I agree with the assessment and plan as stated above.   He is doing well. NYHA I. Volume status looks good. Unable to tolerate ACE-I in past. Refuses ICD or closer f/u. Will increase spiro to 25 daily. Check labs today. Can stop digoxin.

## 2012-10-09 NOTE — Assessment & Plan Note (Signed)
No evidence of ischemia. Continue current regimen.   

## 2012-10-30 ENCOUNTER — Ambulatory Visit: Payer: Self-pay | Admitting: Unknown Physician Specialty

## 2013-02-02 ENCOUNTER — Telehealth (HOSPITAL_COMMUNITY): Payer: Self-pay | Admitting: Cardiology

## 2013-02-02 DIAGNOSIS — I5022 Chronic systolic (congestive) heart failure: Secondary | ICD-10-CM

## 2013-02-02 NOTE — Telephone Encounter (Signed)
Orders placed for upcoming echo..cmj

## 2013-03-14 ENCOUNTER — Encounter (HOSPITAL_COMMUNITY): Payer: Self-pay

## 2013-03-14 ENCOUNTER — Ambulatory Visit (HOSPITAL_COMMUNITY)
Admission: RE | Admit: 2013-03-14 | Discharge: 2013-03-14 | Disposition: A | Discharge status: Home / Self Care | Payer: Managed Care, Other (non HMO) | Source: Ambulatory Visit | Attending: Family Medicine | Admitting: Family Medicine

## 2013-03-14 ENCOUNTER — Ambulatory Visit (HOSPITAL_BASED_OUTPATIENT_CLINIC_OR_DEPARTMENT_OTHER)
Admission: RE | Admit: 2013-03-14 | Discharge: 2013-03-14 | Disposition: A | Discharge status: Home / Self Care | Payer: Managed Care, Other (non HMO) | Source: Ambulatory Visit | Attending: Internal Medicine | Admitting: Internal Medicine

## 2013-03-14 VITALS — BP 102/64 | HR 64 | Ht 71.0 in | Wt 167.8 lb

## 2013-03-14 DIAGNOSIS — I369 Nonrheumatic tricuspid valve disorder, unspecified: Secondary | ICD-10-CM

## 2013-03-14 DIAGNOSIS — I5022 Chronic systolic (congestive) heart failure: Secondary | ICD-10-CM

## 2013-03-14 DIAGNOSIS — I509 Heart failure, unspecified: Secondary | ICD-10-CM | POA: Insufficient documentation

## 2013-03-14 DIAGNOSIS — I379 Nonrheumatic pulmonary valve disorder, unspecified: Secondary | ICD-10-CM | POA: Insufficient documentation

## 2013-03-14 DIAGNOSIS — I079 Rheumatic tricuspid valve disease, unspecified: Secondary | ICD-10-CM | POA: Insufficient documentation

## 2013-03-14 DIAGNOSIS — I059 Rheumatic mitral valve disease, unspecified: Secondary | ICD-10-CM | POA: Insufficient documentation

## 2013-03-14 NOTE — Patient Instructions (Addendum)
Follow up in 6 months  Do the following things EVERYDAY: 1) Weigh yourself in the morning before breakfast. Write it down and keep it in a log. 2) Take your medicines as prescribed 3) Eat low salt foods-Limit salt (sodium) to 2000 mg per day.  4) Stay as active as you can everyday 5) Limit all fluids for the day to less than 2 liters 

## 2013-03-14 NOTE — Progress Notes (Signed)
Patient ID: Vincent Brown, male   DOB: [birth year], 52 y.o.   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 Had cath at [facility]. LAD 100% LCX 70% RCA p70%, d100%. EF 25%. Cardiac MRI: EF 15% anterior wall and inferior wall infarct with significant viability.   Had 3-V CABG on 11/05/10 with LIMA - LAD, SVG-OM2, SVG-PDA.   Unable to tolerate lisinopril due to dizziness.  ECHO 12/2011: EF 25-30% ECHO 03/14/13 EF 25%  He returns for follow up. Denies SOB/PND/Orthopnea/CP. Continues to work full time at [employer]. Compliant with medications. Weight at home 170 pounds. He only takes lasix as needed. Usually takes lasix once a week. Unable to tolerate lisinopril due to dizziness  03/10/13 Creatinine 1.13 Potassium 4.0  Cholesterol 105 HDL 38 Triglyceride 59 LDL 55    ROS: All other systems normal except as mentioned in HPI, past medical history and problem list.    Past Medical History  Diagnosis Date  . Coronary artery disease   . Diabetes mellitus   . Hyperlipidemia   . Ischemic cardiomyopathy   . Chronic renal insufficiency     Current Outpatient Prescriptions  Medication Sig Dispense Refill  . Acetaminophen 500 MG coapsule Take 500 mg by mouth every 4 (four) hours as needed.        Marland Kitchen aspirin 81 MG tablet Take 81 mg by mouth daily.        . furosemide (LASIX) 20 MG tablet Take 20 mg tablet as needed for increased weight gain.  20 tablet  6  . glimepiride (AMARYL) 4 MG tablet Take two tablets by mouth daily.      . metoprolol succinate (TOPROL-XL) 50 MG 24 hr tablet Take 75 mg (1.5 tabs) daily.  45 tablet  6  . NAPROXEN PO Take by mouth as needed.        . simvastatin (ZOCOR) 40 MG tablet Take 1 tablet (40 mg total) by mouth at bedtime.  30 tablet  6  . spironolactone  (ALDACTONE) 25 MG tablet Take 1 tablet (25 mg total) by mouth daily.  30 tablet  6   No current facility-administered medications for this encounter.    No Known Allergies   PHYSICAL EXAM: Filed Vitals:   03/14/13 0925  BP: 102/64  Pulse: 64  Height: 5\' 11"  (1.803 m)  Weight: 167 lb 12.8 oz (76.114 kg)   General:  Well appearing. No respiratory difficulty HEENT: normal Neck: supple. JVP 5-6. Carotids 2+ bilat; no bruits. No lymphadenopathy or thryomegaly appreciated. Cor: PMI nondisplaced. Regular rate & rhythm., No rubs, gallops. TR  Lungs: CTA all lung fields Abdomen: soft, nontender, nondistended. No hepatosplenomegaly. No bruits or masses. Good bowel sounds. Extremities: no cyanosis, clubbing, rash, trace bilateral LE edema.   Neuro: alert & oriented x 3, cranial nerves grossly intact. moves all 4 extremities w/o difficulty.    ASSESSMENT & PLAN

## 2013-03-14 NOTE — Progress Notes (Signed)
  Echocardiogram 2D Echocardiogram has been performed.  Georgian Co 03/14/2013, 8:47 AM

## 2013-03-14 NOTE — Assessment & Plan Note (Addendum)
NYHA II. Volume status stable. Continue current regimen. Reviewed lab work from 03/10/13. Dr Leory Plowman discussed and reviewed ECHO. EF remains 25%. Again suggested referral to EP for ICD however he declines. He understands he is a risk for SCD. Follow up in 6 months.   Patient seen and examined with Tonye Becket, NP. We discussed all aspects of the encounter. I agree with the assessment and plan as stated above. He continues to do well despite severe LV function. Echo images reviewed personally and discussed with him in clinic. Encouraged him to consider ICD but he continues to refuse. Unable to titrated b-blocker due to bradycardia. He had hypotension with ACE in past and refuses repeat trial. Wants f/u only every 6 months.

## 2013-03-29 ENCOUNTER — Encounter: Payer: Self-pay | Admitting: Internal Medicine

## 2013-07-05 ENCOUNTER — Telehealth: Payer: Self-pay

## 2013-07-05 MED ORDER — METOPROLOL SUCCINATE ER 50 MG PO TB24: ORAL_TABLET | ORAL | Status: DC

## 2013-07-05 MED ORDER — SPIRONOLACTONE 25 MG PO TABS: 25.0000 mg | ORAL_TABLET | Freq: Every day | ORAL | Status: DC

## 2013-07-05 NOTE — Telephone Encounter (Signed)
Refills sent in

## 2013-09-17 ENCOUNTER — Encounter: Payer: Self-pay | Admitting: Internal Medicine

## 2013-09-20 ENCOUNTER — Encounter (HOSPITAL_COMMUNITY): Payer: Self-pay

## 2013-09-20 ENCOUNTER — Ambulatory Visit (HOSPITAL_COMMUNITY)
Admission: RE | Admit: 2013-09-20 | Discharge: 2013-09-20 | Disposition: A | Discharge status: Home / Self Care | Payer: Private Health Insurance - Indemnity | Source: Ambulatory Visit | Attending: Internal Medicine | Admitting: Internal Medicine

## 2013-09-20 VITALS — BP 108/62 | HR 88 | Ht 71.0 in | Wt 175.4 lb

## 2013-09-20 DIAGNOSIS — I5022 Chronic systolic (congestive) heart failure: Secondary | ICD-10-CM

## 2013-09-20 DIAGNOSIS — E119 Type 2 diabetes mellitus without complications: Secondary | ICD-10-CM | POA: Insufficient documentation

## 2013-09-20 DIAGNOSIS — N189 Chronic kidney disease, unspecified: Secondary | ICD-10-CM | POA: Insufficient documentation

## 2013-09-20 DIAGNOSIS — E785 Hyperlipidemia, unspecified: Secondary | ICD-10-CM | POA: Insufficient documentation

## 2013-09-20 DIAGNOSIS — I2510 Atherosclerotic heart disease of native coronary artery without angina pectoris: Secondary | ICD-10-CM | POA: Insufficient documentation

## 2013-09-20 DIAGNOSIS — I509 Heart failure, unspecified: Secondary | ICD-10-CM | POA: Insufficient documentation

## 2013-09-20 DIAGNOSIS — E781 Pure hyperglyceridemia: Secondary | ICD-10-CM

## 2013-09-20 DIAGNOSIS — Z951 Presence of aortocoronary bypass graft: Secondary | ICD-10-CM | POA: Insufficient documentation

## 2013-09-20 DIAGNOSIS — I252 Old myocardial infarction: Secondary | ICD-10-CM | POA: Insufficient documentation

## 2013-09-20 NOTE — Progress Notes (Signed)
Patient ID: Vincent Brown, male   DOB: [birth year], 52 y.o.   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 Had cath at [facility]. LAD 100% LCX 70% RCA p70%, d100%. EF 25%. Cardiac MRI: EF 15% anterior wall and inferior wall infarct with significant viability.   Had 3-V CABG on 11/05/2010 with LIMA - LAD, SVG-OM2, SVG-PDA.    ECHO 12/2011: EF 25-30% ECHO 03/14/13 EF 25%  Follow up:  Feeling pretty good. Very active and is able to throw 15 bails of hay a day. Denies SOB, orthopnea, CP or edema. +DOE with moderate exertion. Able to go all the way aroung the grocery store without stopping and can go up stairs with no SOB. Weight stable 170-175 lbs. Trying to follow a low salt/low sugar diet. Taking medications as prescribed. Takes a lasix every couple of weeks.    03/10/13 Creatinine 1.13 Potassium 4.0,  Cholesterol 105 HDL 38 Triglyceride 59 LDL 55 09/17/13 Creatinine 1.12, K+ 5.1, cholesterol 106, HDL 38, LDL 52, TG 78, AST 18, ALT 13, TSH 2.5    ROS: All other systems normal except as mentioned in HPI, past medical history and problem list.    Past Medical History  Diagnosis Date  . Coronary artery disease   . Diabetes mellitus   . Hyperlipidemia   . Ischemic cardiomyopathy   . Chronic renal insufficiency     Current Outpatient Prescriptions  Medication Sig Dispense Refill  . Acetaminophen 500 MG coapsule Take 500 mg by mouth every 4 (four) hours as needed.        Marland Kitchen aspirin 81 MG tablet Take 81 mg by mouth daily.        . celecoxib (CELEBREX) 200 MG capsule Take 200 mg by mouth 2 (two) times daily as needed.      . cyclobenzaprine (FLEXERIL) 10 MG tablet Take 10 mg by mouth at bedtime.      . furosemide (LASIX) 20 MG tablet Take 20 mg tablet as needed for increased  weight gain.  20 tablet  6  . glimepiride (AMARYL) 4 MG tablet Take two tablets by mouth daily.      . metoprolol succinate (TOPROL-XL) 50 MG 24 hr tablet Take 75 mg (1.5 tabs) daily.  45 tablet  6  . NAPROXEN PO Take by mouth as needed.        . simvastatin (ZOCOR) 40 MG tablet Take 1 tablet (40 mg total) by mouth at bedtime.  30 tablet  6  . spironolactone (ALDACTONE) 25 MG tablet Take 1 tablet (25 mg total) by mouth daily.  30 tablet  6   No current facility-administered medications for this encounter.   No Known Allergies  Filed Vitals:   09/20/13 1405  BP: 108/62  Pulse: 88  Height: 5\' 11"  (1.803 m)  Weight: 175 lb 6.4 oz (79.561 kg)  SpO2: 99%    PHYSICAL EXAM: General:  Well appearing. No respiratory difficulty HEENT: normal Neck: supple. JVP 5-6. Carotids 2+ bilat; no bruits. No lymphadenopathy or thryomegaly appreciated. Cor: PMI nondisplaced. Regular rate & rhythm., No rubs, gallops. 2/6 TR  Lungs: CTA all lung fields Abdomen: soft, nontender, nondistended. No hepatosplenomegaly. No bruits or masses. Good bowel sounds. Extremities: no cyanosis, clubbing, rash, trace bilateral LE edema.   Neuro:  alert & oriented x 3, cranial nerves grossly intact. moves all 4 extremities w/o difficulty.   ASSESSMENT & PLAN  1) Chronic systolic HF: ICM, EF 25%  - NYHA II symptoms and volume status stable. Will continue lasix PRN. - Continue Toprol XL 75 mg daily and Spironolactone 25 mg daily. He is not on an ACE-I d/t dizziness.  - BMET 09/17/13 K+ 5.1 will not add low dose ARB at this time. Will recheck 2 weeks and if remains elevated will cut Spiro back to 12.5 mg daily. - Patient does not have an ICD and would like to continue to hold off at this time. He knows that he is at risk of SCD if his EF remains less than 35%. Will recheck ECHO in 6 months and then re-evaluate.  - Reinforced the need and importance of daily weights, a low sodium diet, and fluid restriction (less than 2 L a  day). Instructed to call the HF clinic if weight increases more than 3 lbs overnight or 5 lbs in a week.  2) CAD: s/p CABG x 3 2012 - no s/s of ischemia - lipid profile from 09/17/13 stable will continue current dose of statin. - continue ASA and BB 3) HLD - lipid profile 09/2013 stable.   F/U 6 months with ECHO [name] B NP-C 2:41 PM

## 2013-09-20 NOTE — Patient Instructions (Signed)
Doing great.  Will get labs in 2-4 weeks at [facility] to recheck your potassium.  Call any issues [phone].  Have a wonderful Holiday Season.  Follow up 6 months ECHO.  Do the following things EVERYDAY: 1) Weigh yourself in the morning before breakfast. Write it down and keep it in a log. 2) Take your medicines as prescribed 3) Eat low salt foods-Limit salt (sodium) to 2000 mg per day.  4) Stay as active as you can everyday Limit all fluids for the day to less than 2 liters

## 2013-11-01 DIAGNOSIS — H3520 Other non-diabetic proliferative retinopathy, unspecified eye: Secondary | ICD-10-CM

## 2013-11-01 HISTORY — DX: Other non-diabetic proliferative retinopathy, unspecified eye: H35.20

## 2014-02-28 ENCOUNTER — Other Ambulatory Visit: Payer: Self-pay | Admitting: Internal Medicine

## 2014-03-21 ENCOUNTER — Encounter: Payer: Self-pay | Admitting: Internal Medicine

## 2014-03-28 ENCOUNTER — Ambulatory Visit (HOSPITAL_COMMUNITY)
Admission: RE | Admit: 2014-03-28 | Discharge: 2014-03-28 | Disposition: A | Discharge status: Home / Self Care | Payer: Private Health Insurance - Indemnity | Source: Ambulatory Visit | Attending: Internal Medicine | Admitting: Internal Medicine

## 2014-03-28 VITALS — BP 94/52 | HR 78 | Wt 169.2 lb

## 2014-03-28 DIAGNOSIS — E785 Hyperlipidemia, unspecified: Secondary | ICD-10-CM | POA: Insufficient documentation

## 2014-03-28 DIAGNOSIS — Z951 Presence of aortocoronary bypass graft: Secondary | ICD-10-CM | POA: Insufficient documentation

## 2014-03-28 DIAGNOSIS — E119 Type 2 diabetes mellitus without complications: Secondary | ICD-10-CM | POA: Insufficient documentation

## 2014-03-28 DIAGNOSIS — I2510 Atherosclerotic heart disease of native coronary artery without angina pectoris: Secondary | ICD-10-CM

## 2014-03-28 DIAGNOSIS — N189 Chronic kidney disease, unspecified: Secondary | ICD-10-CM | POA: Insufficient documentation

## 2014-03-28 DIAGNOSIS — Z79899 Other long term (current) drug therapy: Secondary | ICD-10-CM | POA: Insufficient documentation

## 2014-03-28 DIAGNOSIS — I252 Old myocardial infarction: Secondary | ICD-10-CM | POA: Insufficient documentation

## 2014-03-28 DIAGNOSIS — I2589 Other forms of chronic ischemic heart disease: Secondary | ICD-10-CM | POA: Insufficient documentation

## 2014-03-28 DIAGNOSIS — I509 Heart failure, unspecified: Secondary | ICD-10-CM | POA: Insufficient documentation

## 2014-03-28 DIAGNOSIS — I5022 Chronic systolic (congestive) heart failure: Secondary | ICD-10-CM

## 2014-03-28 MED ORDER — METOPROLOL SUCCINATE ER 50 MG PO TB24: ORAL_TABLET | ORAL | Status: DC

## 2014-03-28 MED ORDER — FUROSEMIDE 20 MG PO TABS: ORAL_TABLET | ORAL | Status: DC

## 2014-03-28 MED ORDER — SPIRONOLACTONE 25 MG PO TABS
25.0000 mg | ORAL_TABLET | Freq: Every day | ORAL | Status: DC
Start: 2014-03-28 — End: 2014-10-14

## 2014-03-28 MED ORDER — SIMVASTATIN 40 MG PO TABS
40.0000 mg | ORAL_TABLET | Freq: Every day | ORAL | Status: DC
Start: 2014-03-28 — End: 2016-02-11

## 2014-03-28 NOTE — Progress Notes (Signed)
Patient ID: Vincent Brown, male   DOB: 31-Mar-1961, 53 y.o.   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 [facility]. LAD 100% LCX 70% RCA p70%, d100%. EF 25%. Cardiac MRI: EF 15% anterior wall and inferior wall infarct with significant viability.   ECHO 12/2011: EF 25-30% ECHO 03/14/13 EF 25%  Follow up for Heart Failure: Doing well. 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 Denies SOB, CP, orthopnea or edema. Gets minimal SOB if he lifts a bunch of bails of hay quick.  Able to go all the way aroung the grocery store without stopping and can go up stairs with no SOB. Weight stable 168-172 lbs. Trying to follow a low salt/low sugar diet and drinking less than 2L a day. Taking medications as prescribed. Has not needed any lasix in a couple of months. Mild dizziness with getting up to fast or squatting.    03/10/13 Creatinine 1.13 Potassium 4.0,  Cholesterol 105 HDL 38 Triglyceride 59 LDL 55 09/17/13 Creatinine 1.12, K+ 5.1, cholesterol 106, HDL 38, LDL 52, TG 78, AST 18, ALT 13, TSH 2.5  03/21/14: K 4.3, creatinine 0.99, AST 20, ALT 16, cholosterol 101, TG 57, HDL 38, LDL 52, TSH 1.5  SH: Works at [employer]. Lives [place]. No ETOH or smoking FH: Father deceased: HTN, pacemaker        Mother deceased: no health issues  ROS: All other systems normal except as mentioned in HPI, past medical history and problem list.    Past Medical History  Diagnosis Date  . Coronary artery disease   . Diabetes mellitus   . Hyperlipidemia   . Ischemic cardiomyopathy   . Chronic renal insufficiency     Current Outpatient Prescriptions  Medication Sig Dispense Refill  . Acetaminophen 500 MG  coapsule Take 500 mg by mouth every 4 (four) hours as needed.        . celecoxib (CELEBREX) 200 MG capsule Take 200 mg by mouth 2 (two) times daily as needed.      . cyclobenzaprine (FLEXERIL) 10 MG tablet Take 10 mg by mouth at bedtime.      . furosemide (LASIX) 20 MG tablet Take 20 mg tablet as needed for increased weight gain.  20 tablet  6  . glimepiride (AMARYL) 4 MG tablet Take two tablets by mouth daily.      . metoprolol succinate (TOPROL-XL) 50 MG 24 hr tablet Take 75 mg (1.5 tabs) daily.  45 tablet  6  . simvastatin (ZOCOR) 40 MG tablet Take 1 tablet (40 mg total) by mouth at bedtime.  30 tablet  6  . spironolactone (ALDACTONE) 25 MG tablet take 1 tablet by mouth once daily  30 tablet  6   No current facility-administered medications for this encounter.   No Known Allergies  Filed Vitals:   03/28/14 1347  BP: 94/52  Pulse: 78  Weight: 169 lb 4 oz (76.771 kg)  SpO2: 97%    PHYSICAL EXAM: General:  Well appearing. No respiratory difficulty HEENT: normal Neck: supple. JVP 5-6. Carotids 2+ bilat; no bruits. No lymphadenopathy or thryomegaly appreciated. Cor: PMI nondisplaced. Regular rate & rhythm., No  rubs, gallops. 2/6 TR  Lungs: CTA all lung fields Abdomen: soft, nontender, nondistended. No hepatosplenomegaly. No bruits or masses. Good bowel sounds. Extremities: no cyanosis, clubbing, rash, trace bilateral LE edema.   Neuro: alert & oriented x 3, cranial nerves grossly intact. moves all 4 extremities w/o difficulty.   ASSESSMENT & PLAN  1) Chronic systolic HF: ICM, EF 25% (03/2013) - NYHA II symptoms and volume status stable. Will continue lasix PRN. Will send him a new prescription.  - Continue Toprol XL 75 mg daily and Spironolactone 25 mg daily. He is not on an ACE-I d/t dizziness. Will not start ARB now with low BP.  - Patient does not have an ICD and would like to continue to hold off at this time. He knows that he is at risk of SCD if his EF remains less than 35%.  He was supposed to have ECHO today but issue with scheduling. Would like to bring him back next week, however he would like to wait 6 months.  - Reinforced the need and importance of daily weights, a low sodium diet, and fluid restriction (less than 2 L a day). Instructed to call the HF clinic if weight increases more than 3 lbs overnight or 5 lbs in a week.  2) CAD: s/p CABG x 3 2012 - no s/s of ischemia - lipid profile from 03/2014 stable will continue current dose of statin. - continue ASA and BB 3) HLD - lipid profile 03/2014 stable.   F/U 6 months with ECHO Aundria Rud NP-C 2:03 PM  [name], NP. We discussed all aspects of the encounter. I agree with the assessment and plan as stated above. Overall doing well. NYHA I-II. Unable to titrate meds due to low BP. Does not want ICD and refuses to get echo in near future or see [place] on a more frequent basis. Will schedule echo for next office visit.   Bevelyn Buckles Bensimhon,MD 6:29 PM

## 2014-03-28 NOTE — Patient Instructions (Signed)
We will contact you in 6 months to schedule your next appointment and echocardiogram  

## 2014-10-14 ENCOUNTER — Ambulatory Visit (HOSPITAL_COMMUNITY)
Admission: RE | Admit: 2014-10-14 | Discharge: 2014-10-14 | Disposition: A | Discharge status: Home / Self Care | Payer: Private Health Insurance - Indemnity | Source: Ambulatory Visit | Attending: Internal Medicine | Admitting: Internal Medicine

## 2014-10-14 VITALS — BP 104/64 | HR 68 | Wt 176.2 lb

## 2014-10-14 DIAGNOSIS — Z79899 Other long term (current) drug therapy: Secondary | ICD-10-CM | POA: Diagnosis not present

## 2014-10-14 DIAGNOSIS — I5022 Chronic systolic (congestive) heart failure: Secondary | ICD-10-CM

## 2014-10-14 DIAGNOSIS — I255 Ischemic cardiomyopathy: Secondary | ICD-10-CM | POA: Insufficient documentation

## 2014-10-14 DIAGNOSIS — E785 Hyperlipidemia, unspecified: Secondary | ICD-10-CM | POA: Diagnosis not present

## 2014-10-14 DIAGNOSIS — Z951 Presence of aortocoronary bypass graft: Secondary | ICD-10-CM | POA: Diagnosis not present

## 2014-10-14 DIAGNOSIS — E119 Type 2 diabetes mellitus without complications: Secondary | ICD-10-CM | POA: Insufficient documentation

## 2014-10-14 DIAGNOSIS — I509 Heart failure, unspecified: Secondary | ICD-10-CM | POA: Diagnosis present

## 2014-10-14 DIAGNOSIS — I059 Rheumatic mitral valve disease, unspecified: Secondary | ICD-10-CM

## 2014-10-14 DIAGNOSIS — I2510 Atherosclerotic heart disease of native coronary artery without angina pectoris: Secondary | ICD-10-CM | POA: Diagnosis not present

## 2014-10-14 NOTE — Progress Notes (Signed)
Patient ID: Vincent Brown, male   DOB: [birth year], 53 y.o.   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 [facility]. LAD 100% LCX 70% RCA p70%, d100%. EF 25%. Cardiac MRI: EF 15% anterior wall and inferior wall infarct with significant viability.   ECHO 12/2011: EF 25-30% ECHO 03/14/13 EF 25%  Follow up for Heart Failure: Doing well. Tells me that his spironolactone cut in half due to high K+. Very active with work - working 13/14 days in a row. 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 Denies SOB, CP, orthopnea or edema. Has gained a couple of pounds due to eating too much. Only weights a couple of times per month. Takes lasix as needed. 1-2x per month. Trying to follow a low salt/low sugar diet and drinking less than 2L a day. Taking medications as prescribed. Has not needed any lasix in a couple of months. Mild dizziness with getting up to fast or squatting.    03/10/13 Creatinine 1.13 Potassium 4.0,  Cholesterol 105 HDL 38 Triglyceride 59 LDL 55 09/17/13 Creatinine 1.12, K+ 5.1, cholesterol 106, HDL 38, LDL 52, TG 78, AST 18, ALT 13, TSH 2.5  03/21/14: K 4.3, creatinine 0.99, AST 20, ALT 16, cholosterol 101, TG 57, HDL 38, LDL 52, TSH 1.5 11/15: K 4.7 creatinine 1.09 TC 114, TG 51, HDL 44 LDL 60   SH: Works at [employer]. [place]. No ETOH or smoking FH: Father deceased: HTN, pacemaker        Mother deceased: no health issues  ROS: All other systems normal except as mentioned in HPI, past medical history and problem list.    Past Medical History  Diagnosis Date  . Coronary artery disease   . Diabetes mellitus   . Hyperlipidemia   . Ischemic cardiomyopathy   . Chronic renal  insufficiency     Current Outpatient Prescriptions  Medication Sig Dispense Refill  . Acetaminophen 500 MG coapsule Take 500 mg by mouth every 4 (four) hours as needed.      . celecoxib (CELEBREX) 200 MG capsule Take 200 mg by mouth 2 (two) times daily as needed.    . cyclobenzaprine (FLEXERIL) 10 MG tablet Take 10 mg by mouth at bedtime.    . furosemide (LASIX) 20 MG tablet Take 20 mg tablet as needed for increased weight gain. 20 tablet 6  . glimepiride (AMARYL) 4 MG tablet Take two tablets by mouth daily.    . metoprolol succinate (TOPROL-XL) 50 MG 24 hr tablet Take 75 mg (1.5 tabs) daily. 45 tablet 6  . simvastatin (ZOCOR) 40 MG tablet Take 1 tablet (40 mg total) by mouth at bedtime. 30 tablet 6  . spironolactone (ALDACTONE) 25 MG tablet Take 12.5 mg by mouth daily.     No current facility-administered medications for this encounter.   No Known Allergies  Filed Vitals:   10/14/14 1426  BP: 104/64  Pulse: 68  Weight: 176 lb 4 oz (79.946 kg)  SpO2: 99%    PHYSICAL EXAM: General:  Well appearing. No respiratory difficulty HEENT: normal Neck: supple. JVP 5-6. Carotids 2+ bilat; no bruits. No lymphadenopathy or thryomegaly appreciated. Cor: PMI nondisplaced. Regular rate & rhythm.,  No rubs, gallops. 2/6 TR  Lungs: CTA all lung fields Abdomen: soft, nontender, nondistended. No hepatosplenomegaly. No bruits or masses. Good bowel sounds. Extremities: no cyanosis, clubbing, rash, trace bilateral LE edema.   Neuro: alert & oriented x 3, cranial nerves grossly intact. moves all 4 extremities w/o difficulty.   ASSESSMENT & PLAN  1) Chronic systolic HF: ICM, EF 25% (03/2013) - NYHA II symptoms and volume status stable. Will continue lasix PRN. Will send him a new prescription.  - Continue Toprol XL 75 mg daily and Spironolactone 12.5 mg daily. He is not on an ACE-I/ARB d/t dizziness.   - Patient does not have an ICD and would like to continue to hold off at this time. He knows that he  is at risk of SCD if his EF remains less than 35%. Will check echo today - Reinforced the need and importance of daily weights, a low sodium diet, and fluid restriction (less than 2 L a day). Instructed to call the HF clinic if weight increases more than 3 lbs overnight or 5 lbs in a week.  2) CAD: s/p CABG x 3 2012 - no s/s of ischemia - lipid profile from11/2015 stable will continue current dose of statin. - continue ASA and BB 3) HLD - lipid profile 09/2014 stable. LFTs ok    Truman Hayward 2:47 PM

## 2014-10-14 NOTE — Progress Notes (Signed)
  Echocardiogram 2D Echocardiogram has been performed.  Cathie Beams 10/14/2014, 3:57 PM

## 2015-03-10 ENCOUNTER — Ambulatory Visit (HOSPITAL_COMMUNITY)
Admission: RE | Admit: 2015-03-10 | Discharge: 2015-03-10 | Disposition: A | Discharge status: Home / Self Care | Payer: Private Health Insurance - Indemnity | Source: Ambulatory Visit | Attending: Cardiology | Admitting: Cardiology

## 2015-03-10 ENCOUNTER — Encounter (HOSPITAL_COMMUNITY): Payer: Self-pay

## 2015-03-10 VITALS — BP 106/66 | HR 78 | Wt 168.0 lb

## 2015-03-10 DIAGNOSIS — E785 Hyperlipidemia, unspecified: Secondary | ICD-10-CM | POA: Diagnosis not present

## 2015-03-10 DIAGNOSIS — Z951 Presence of aortocoronary bypass graft: Secondary | ICD-10-CM | POA: Diagnosis not present

## 2015-03-10 DIAGNOSIS — N189 Chronic kidney disease, unspecified: Secondary | ICD-10-CM | POA: Insufficient documentation

## 2015-03-10 DIAGNOSIS — E119 Type 2 diabetes mellitus without complications: Secondary | ICD-10-CM | POA: Insufficient documentation

## 2015-03-10 DIAGNOSIS — I2510 Atherosclerotic heart disease of native coronary artery without angina pectoris: Secondary | ICD-10-CM

## 2015-03-10 DIAGNOSIS — Z7982 Long term (current) use of aspirin: Secondary | ICD-10-CM | POA: Insufficient documentation

## 2015-03-10 DIAGNOSIS — I255 Ischemic cardiomyopathy: Secondary | ICD-10-CM | POA: Diagnosis not present

## 2015-03-10 DIAGNOSIS — Z79899 Other long term (current) drug therapy: Secondary | ICD-10-CM | POA: Diagnosis not present

## 2015-03-10 DIAGNOSIS — I5022 Chronic systolic (congestive) heart failure: Secondary | ICD-10-CM | POA: Insufficient documentation

## 2015-03-10 MED ORDER — ASPIRIN EC 81 MG PO TBEC: 81.0000 mg | DELAYED_RELEASE_TABLET | Freq: Every day | ORAL | Status: DC

## 2015-03-10 NOTE — Patient Instructions (Signed)
Start Aspirin 81 mg daily  Your physician has requested that you have en exercise stress myoview. For further information please visit [email or web address] Please follow instruction sheet, as given.  [place]  We will contact you in 6 months to schedule your next appointment.

## 2015-03-10 NOTE — Progress Notes (Signed)
Patient ID: Vincent Brown, male   DOB: November 01, 1961, 54 y.o.   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  Unable to tolerate lisinopril due to dizziness.  Had a NSTEMI in January 2012 after presenting with bad cough. Had cath at [facility]. LAD 100% LCX 70% RCA p70%, d100%. EF 25%. Cardiac MRI: EF 15% anterior wall and inferior wall infarct with significant viability.  ECHO 12/2011: EF 25-30% ECHO 03/14/13 EF 25% ECHO 12/15: EF 25%, diffuse hypokinesis, normal RV size, mildly decreased RV systolic function, PA systolic pressure 40 mmHg  Doing well overall.  No chest pain.  No dyspnea with mild to moderate activity, notices some shortness of breath loading hay into a truck.  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  Very active.  Main complaint is fatigue.  He thinks that this is somewhat worsened but is nonspecific.    Labs: 03/10/13: Creatinine 1.13 Potassium 4.0,  Cholesterol 105 HDL 38 Triglyceride 59 LDL 55 09/17/13: Creatinine 1.12, K+ 5.1, cholesterol 106, HDL 38, LDL 52, TG 78, AST 18, ALT 13, TSH 2.5  03/21/14: K 4.3, creatinine 0.99, AST 20, ALT 16, cholosterol 101, TG 57, HDL 38, LDL 52, TSH 1.5 11/15: K 4.7 creatinine 1.09 TC 114, TG 51, HDL 44 LDL 60 5/16: K 4.2, creatinine 1.13, HDL 35, LDL 56, HCT 35.9  SH: Works at [employer] Lives with wife Carmi. No ETOH or smoking  FH: Father deceased: HTN, pacemaker        Mother deceased: no health issues  ROS: All other systems normal except as mentioned in HPI, past medical history and problem list.   Past Medical History  Diagnosis Date  . Coronary artery disease   . Diabetes mellitus   . Hyperlipidemia   . Ischemic cardiomyopathy   . Chronic renal insufficiency     Current Outpatient Prescriptions  Medication Sig Dispense Refill  . Acetaminophen  500 MG coapsule Take 500 mg by mouth every 4 (four) hours as needed.      . celecoxib (CELEBREX) 200 MG capsule Take 200 mg by mouth 2 (two) times daily as needed.    . cyclobenzaprine (FLEXERIL) 10 MG tablet Take 10 mg by mouth at bedtime.    . furosemide (LASIX) 20 MG tablet Take 20 mg tablet as needed for increased weight gain. 20 tablet 6  . glimepiride (AMARYL) 4 MG tablet Take two tablets by mouth daily.    . metoprolol succinate (TOPROL-XL) 50 MG 24 hr tablet Take 75 mg (1.5 tabs) daily. 45 tablet 6  . simvastatin (ZOCOR) 40 MG tablet Take 1 tablet (40 mg total) by mouth at bedtime. 30 tablet 6  . spironolactone (ALDACTONE) 25 MG tablet Take 12.5 mg by mouth daily.    Marland Kitchen aspirin EC 81 MG tablet Take 1 tablet (81 mg total) by mouth daily. 90 tablet 3   No current facility-administered medications for this encounter.   No Known Allergies  Filed Vitals:   03/10/15 1004  BP: 106/66  Pulse: 78  Weight: 168 lb (76.204 kg)  SpO2: 99%    PHYSICAL EXAM: General:  Well appearing. No respiratory difficulty HEENT: normal Neck: supple. JVP 5-6. Carotids 2+ bilat; no bruits. No lymphadenopathy or thryomegaly appreciated. Cor: PMI nondisplaced. Regular rate & rhythm., No rubs, gallops. No murmur.  Lungs: CTA all lung fields Abdomen: soft, nontender, nondistended. No hepatosplenomegaly. No bruits or masses. Good bowel sounds. Extremities: no cyanosis, clubbing, rash, trace bilateral LE edema.   Neuro: alert & oriented x 3, cranial nerves grossly intact. moves all 4 extremities w/o difficulty.  ASSESSMENT & PLAN 1) Chronic systolic HF: Ischemic cardiomyopathy, EF 25% on 12/15 echo.  NYHA class II symptoms.  He is not volume overloaded on exam.  BP is on the lower side today.  - Continue Toprol XL 75 mg daily and Spironolactone 12.5 mg daily. He is not on an ACE-I/ARB due to dizziness.   - Patient does not have an ICD and would like to continue to hold off at this time. He knows that he is at  risk of SCD with EF persistently low. - Reinforced the need and importance of daily weights, a low sodium diet, and fluid restriction (less than 2 L a day). Instructed to call the HF clinic if weight increases more than 3 lbs overnight or 5 lbs in a week.  2) CAD: s/p CABG x 3 2012.  He needs to restart ASA 81 daily.  Good lipids on simvastatin.  No chest pain but reports more fatigue.  We talked about getting an ETT-Cardiolite today.  He wants to hold off for now unless he feels markedly worse.   3) HLD: Good lipids in 5/16.   Jack Mineau,MD 03/10/2015

## 2015-04-07 ENCOUNTER — Other Ambulatory Visit (HOSPITAL_COMMUNITY): Payer: Self-pay | Admitting: *Deleted

## 2015-04-08 ENCOUNTER — Other Ambulatory Visit (HOSPITAL_COMMUNITY): Payer: Self-pay | Admitting: *Deleted

## 2015-04-08 MED ORDER — METOPROLOL SUCCINATE ER 50 MG PO TB24
ORAL_TABLET | ORAL | Status: DC
Start: 2015-04-08 — End: 2015-12-02

## 2015-04-08 NOTE — Addendum Note (Signed)
Encounter addended by: Noralee Space, RN on: 04/08/2015 10:45 AM<BR>     Documentation filed: [employer], Orders

## 2015-07-28 ENCOUNTER — Encounter: Payer: Self-pay | Admitting: *Deleted

## 2015-08-05 ENCOUNTER — Encounter: Payer: Self-pay | Admitting: General Surgery

## 2015-08-05 ENCOUNTER — Ambulatory Visit (INDEPENDENT_AMBULATORY_CARE_PROVIDER_SITE_OTHER): Payer: Managed Care, Other (non HMO) | Admitting: General Surgery

## 2015-08-05 VITALS — BP 98/58 | HR 88 | Resp 12 | Ht 71.0 in | Wt 155.0 lb

## 2015-08-05 DIAGNOSIS — L089 Local infection of the skin and subcutaneous tissue, unspecified: Secondary | ICD-10-CM | POA: Diagnosis not present

## 2015-08-05 DIAGNOSIS — T148XXA Other injury of unspecified body region, initial encounter: Principal | ICD-10-CM

## 2015-08-05 DIAGNOSIS — T148 Other injury of unspecified body region: Secondary | ICD-10-CM

## 2015-08-05 MED ORDER — SILVER SULFADIAZINE 1 % EX CREA
TOPICAL_CREAM | Freq: Two times a day (BID) | CUTANEOUS | Status: DC
Start: 2015-08-05 — End: 2015-10-16

## 2015-08-05 NOTE — Patient Instructions (Addendum)
Keep area clean Avoid peroxide Twice a day dressing changes

## 2015-08-05 NOTE — Progress Notes (Signed)
Patient ID: Vincent Brown, male   DOB: October 01, 1961, 54 y.o.   MRN: [id number]  Chief Complaint  Patient presents with  . Other    non healing wound    HPI Vincent Brown is a 54 y.o. male.  Here today for evaluation of a sore that want heal. It is his lower right leg. He states last Thanksgiving he was doing wood and a log chain hit it and took skin off. He had been making use of 56 year old Silvadene cream intermittently to the area. It has waxed and waned in size, 2 inches initially, now significantly larger in the last few weeks. He had been cleaning with peroxide.  2012 myocardial event resulting in triple bypass at [facility]. Now follow through the cardiology clinic in [place]. Diabetic for 12-13 years on oral agents. Reports his twice a year hemoglobin A1c values are below 7.  His wife, Neoma Laming, Therapist, sports, reports that he has been getting more and more strict on his diet and his weight has continued to drift down, now down 60 pounds from 1995.Steadily losing weight. Per his wife his food choices aren't that great.   He was on Avastin eye injectionis last year as well. He does not recall it becoming infected.     HPI  Past Medical History  Diagnosis Date  . Coronary artery disease 2012  . Diabetes mellitus 2003  . Hyperlipidemia   . Ischemic cardiomyopathy   . Chronic renal insufficiency   . Shingles 2003    Past Surgical History  Procedure Laterality Date  . Varicose vein surgery  1995    right lower extremity  . Coronary artery bypass graft  11/05/2010  . Appendectomy  1971  . Refractive surgery  2009, 2015  . Colonoscopy  2015    Family History  Problem Relation Age of Onset  . Hypertension Father     Social History Social History  Substance Use Topics  . Smoking status: Never Smoker   . Smokeless tobacco: Never Used  . Alcohol Use: No    No Known Allergies  Current Outpatient Prescriptions  Medication Sig Dispense Refill  . Acetaminophen 500 MG coapsule Take  500 mg by mouth every 4 (four) hours as needed.      Marland Kitchen aspirin EC 81 MG tablet Take 1 tablet (81 mg total) by mouth daily. 90 tablet 3  . celecoxib (CELEBREX) 200 MG capsule Take 200 mg by mouth 2 (two) times daily as needed.    . cyclobenzaprine (FLEXERIL) 10 MG tablet Take 10 mg by mouth at bedtime.    Marland Kitchen glimepiride (AMARYL) 4 MG tablet Take two tablets by mouth daily.    . metoprolol succinate (TOPROL-XL) 50 MG 24 hr tablet Take 75 mg (1.5 tabs) daily. 45 tablet 6  . simvastatin (ZOCOR) 40 MG tablet Take 1 tablet (40 mg total) by mouth at bedtime. 30 tablet 6  . spironolactone (ALDACTONE) 25 MG tablet Take 12.5 mg by mouth daily.    . traMADol (ULTRAM) 50 MG tablet take 1 tablet by mouth every 8 hours if needed for pain  0  . furosemide (LASIX) 20 MG tablet Take 20 mg tablet as needed for increased weight gain. 20 tablet 6  . silver sulfADIAZINE (SILVADENE) 1 % cream Apply topically 2 (two) times daily. Apply to affected area twice a day 400 g 3   No current facility-administered medications for this visit.    Review of Systems Review of Systems  Constitutional: Positive for unexpected weight  change (60 pound weight loss since 1995.).  Eyes: Negative.   Respiratory: Negative.   Cardiovascular: Negative.   Gastrointestinal: Negative.   Genitourinary: Negative.   Allergic/Immunologic: Negative.   Neurological: Negative.   Hematological: Negative.   Psychiatric/Behavioral: Negative.     Blood pressure 98/58, pulse 88, resp. rate 12, height '5\' 11"'  (1.803 m), weight 155 lb (70.308 kg).  Physical Exam Physical Exam  Constitutional: He is oriented to person, place, and time. He appears well-developed and well-nourished.  HENT:  Mouth/Throat: Oropharynx is clear and moist.  Eyes: Conjunctivae are normal.  Neck: Neck supple.  Cardiovascular: Normal rate, regular rhythm and normal heart sounds.   Pulses:      Femoral pulses are 2+ on the right side, and 2+ on the left side.       Dorsalis pedis pulses are 2+ on the right side, and 2+ on the left side.       Posterior tibial pulses are 0 on the right side, and 2+ on the left side.  Aorta and normal to palpation. No abdominal bruits.  Abdominal: There is no tenderness.  Musculoskeletal:       Legs: Lymphadenopathy:    He has no cervical adenopathy.       Right: No inguinal adenopathy present.       Left: No inguinal adenopathy present.  Neurological: He is alert and oriented to person, place, and time.  Skin: Skin is warm and dry.  7.5 x 13 cm right lower leg wound, 1 cm deep in areas, 4 cm undermining superiorly. 1cm Satellite ulcer inferior.  Psychiatric: His behavior is normal.    Data Reviewed Cardiology evaluation: May 2016 ASSESSMENT & PLAN 1) Chronic systolic HF: Ischemic cardiomyopathy, EF 25% on 12/15 echo. NYHA class II symptoms. He is not volume overloaded on exam. BP is on the lower side today.  - Continue Toprol XL 75 mg daily and Spironolactone 12.5 mg daily. He is not on an ACE-I/ARB due to dizziness.  - Patient does not have an ICD and would like to continue to hold off at this time. He knows that he is at risk of SCD with EF persistently low. - Reinforced the need and importance of daily weights, a low sodium diet, and fluid restriction (less than 2 L a day). Instructed to call the HF clinic if weight increases more than 3 lbs overnight or 5 lbs in a week.  2) CAD: s/p CABG x 3 2012. He needs to restart ASA 81 daily. Good lipids on simvastatin. No chest pain but reports more fatigue. We talked about getting an ETT-Cardiolite today. He wants to hold off for now unless he feels markedly worse.  3) HLD: Good lipids in 5/16.   Dalton McLean,MD 03/10/2015  Comprehensive metabolic panel from May 0272 showed an EGFR of 85%.  Hemoglobin 12.2 with an MCV of 85.  Hemoglobin A1c: 6.5. Assessment    Massive wound of the right lower extremity secondary to combined crush/abrasive injury.      Plan    The area of obviously necrotic tissue is sharply debrided. Pinpoint bleeding noted. The area was coated with a layer of Silvadene cream followed by gauze, Kerlix wrap.  With the area of undermining he will likely benefit from formal debridement and exposure of this area. While he has a palpable pulse on the posterior tibial vessel, he may have significant small vessel disease making healing more difficult.  The importance of vascular evaluation prior to debridement was reviewed. After  debridement, he may be a candidate for a wound VAC followed by a skin graft. We will have vascular investigate first, then debridement, and then referral to the wound clinic.      Refer to vascular. Refer to wound clinic. Culture pending. Possible benefit of wound vac.  Follow up after vascular surgery evaluation.Marland Kitchen   PCP:  Timoteo Gaul 08/05/2015, 11:27 AM

## 2015-08-09 LAB — ANAEROBIC AND AEROBIC CULTURE

## 2015-08-13 ENCOUNTER — Ambulatory Visit: Payer: Self-pay | Admitting: General Surgery

## 2015-08-25 ENCOUNTER — Encounter: Payer: Self-pay | Admitting: General Surgery

## 2015-08-25 ENCOUNTER — Ambulatory Visit (INDEPENDENT_AMBULATORY_CARE_PROVIDER_SITE_OTHER): Payer: Managed Care, Other (non HMO) | Admitting: General Surgery

## 2015-08-25 VITALS — BP 126/68 | HR 76 | Resp 14 | Ht 71.0 in | Wt 158.0 lb

## 2015-08-25 DIAGNOSIS — L97912 Non-pressure 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 lower leg with fat layer exposed: Secondary | ICD-10-CM

## 2015-08-25 DIAGNOSIS — IMO0001 Reserved for inherently not codable concepts without codable children: Secondary | ICD-10-CM

## 2015-08-25 DIAGNOSIS — I83029 Varicose veins of left lower extremity with ulcer of unspecified site: Secondary | ICD-10-CM | POA: Diagnosis not present

## 2015-08-25 MED ORDER — TRAMADOL HCL 50 MG PO TABS
50.0000 mg | ORAL_TABLET | Freq: Four times a day (QID) | ORAL | Status: DC | PRN
Start: 2015-08-25 — End: 2015-12-02

## 2015-08-25 NOTE — Progress Notes (Signed)
Patient ID: ACESON LABELL, male   DOB: 1960-11-18, 54 y.o.   MRN: [id number]  Chief Complaint  Patient presents with  . Follow-up    Leg Ulcer    HPI Vincent Brown is a 54 y.o. male here today to follow up for right leg ulcer. Patient states he saw Dr. Gilda Brown today he had an Radio broadcast assistant on for 5 days. He states his leg is improving.  HPI  Past Medical History  Diagnosis Date  . Coronary artery disease 2012  . Diabetes mellitus 2003  . Hyperlipidemia   . Ischemic cardiomyopathy   . Chronic renal insufficiency   . Shingles 2003    Past Surgical History  Procedure Laterality Date  . Varicose vein surgery  1995    right lower extremity  . Coronary artery bypass graft  11/05/2010  . Appendectomy  1971  . Refractive surgery  2009, 2015  . Colonoscopy  2015    Family History  Problem Relation Age of Onset  . Hypertension Father     Social History Social History  Substance Use Topics  . Smoking status: Never Smoker   . Smokeless tobacco: Never Used  . Alcohol Use: No    No Known Allergies  Current Outpatient Prescriptions  Medication Sig Dispense Refill  . Acetaminophen 500 MG coapsule Take 500 mg by mouth every 4 (four) hours as needed.      Marland Kitchen aspirin EC 81 MG tablet Take 1 tablet (81 mg total) by mouth daily. 90 tablet 3  . celecoxib (CELEBREX) 200 MG capsule Take 200 mg by mouth 2 (two) times daily as needed.    . cyclobenzaprine (FLEXERIL) 10 MG tablet Take 10 mg by mouth at bedtime.    . furosemide (LASIX) 20 MG tablet Take 20 mg tablet as needed for increased weight gain. 20 tablet 6  . glimepiride (AMARYL) 4 MG tablet Take two tablets by mouth daily.    Marland Kitchen levofloxacin (LEVAQUIN) 500 MG tablet take 1 tablet by mouth once daily for 14 days  0  . metoprolol succinate (TOPROL-XL) 50 MG 24 hr tablet Take 75 mg (1.5 tabs) daily. 45 tablet 6  . silver sulfADIAZINE (SILVADENE) 1 % cream Apply topically 2 (two) times daily. Apply to affected area twice a day 400 g 3  .  simvastatin (ZOCOR) 40 MG tablet Take 1 tablet (40 mg total) by mouth at bedtime. 30 tablet 6  . spironolactone (ALDACTONE) 25 MG tablet Take 12.5 mg by mouth daily.    . traMADol (ULTRAM) 50 MG tablet take 1 tablet by mouth every 8 hours if needed for pain  0  . traMADol (ULTRAM) 50 MG tablet Take 1 tablet (50 mg total) by mouth every 6 (six) hours as needed. 30 tablet 0   No current facility-administered medications for this visit.    Review of Systems Review of Systems  Constitutional: Negative.   Respiratory: Negative.   Cardiovascular: Positive for leg swelling (right leg).    Blood pressure 126/68, pulse 76, resp. rate 14, height  (1.803 m), weight 158 lb (71.668 kg).  Physical Exam Physical Exam  Constitutional: He is oriented to person, place, and time. He appears well-developed and well-nourished.  Cardiovascular:  Pulses:      Dorsalis pedis pulses are 2+ on the right side.       Posterior tibial pulses are 2+ on the right side.  Mild lower extremity edema. Prominent varicosity superior and lateral to the main ulcer.  Musculoskeletal:  Legs: Neurological: He is alert and oriented to person, place, and time.  Skin: Skin is warm and dry.    Data Reviewed Vincent Brown, M.D. notes reviewed. Impression is that of a venous stasis ulcer.  Assessment    Clean wound status post debridement.    Plan    Further management is planned by vascular. The patient has an appointment at vascular for Unna boot change in one week and duplex imaging/sequential pressure testing in 2 weeks. The patient's wife, a Designer, jewellery, was provided with an additional Unna boot to apply if this one become saturated, as the original boot applied last week had become very dampened with drainage. She was instructed on proper application.     IFB:[id number]  Vincent Brown 08/26/2015, 11:49 AM

## 2015-08-26 DIAGNOSIS — L97909 Non-pressure chronic ulcer of unspecified part of unspecified lower leg with unspecified severity: Secondary | ICD-10-CM

## 2015-08-26 DIAGNOSIS — I83009 Varicose veins of unspecified lower extremity with ulcer of unspecified site: Secondary | ICD-10-CM | POA: Insufficient documentation

## 2015-09-15 ENCOUNTER — Encounter: Payer: Self-pay | Admitting: General Surgery

## 2015-09-17 ENCOUNTER — Telehealth (HOSPITAL_COMMUNITY): Payer: Self-pay | Admitting: *Deleted

## 2015-09-17 NOTE — Telephone Encounter (Signed)
Patient given detailed instructions per Myocardial Perfusion Study Information Sheet for the test on 09/22/15 at 730. Patient notified to arrive 15 minutes early and that it is imperative to arrive on time for appointment to keep from having the test rescheduled.  If you need to cancel or reschedule your appointment, please call the office within 24 hours of your appointment. Failure to do so may result in a cancellation of your appointment, and a $50 no show fee. Patient verbalized understanding.Luanna Cole Nalanie Winiecki,RN

## 2015-09-17 NOTE — Telephone Encounter (Signed)
Left message on voicemail in reference to upcoming appointment scheduled for 09/22/15. Phone number given for a call back so details instructions can be given. Madalee Altmann J Eliyanna Ault, RN 

## 2015-09-22 ENCOUNTER — Ambulatory Visit (INDEPENDENT_AMBULATORY_CARE_PROVIDER_SITE_OTHER): Payer: Managed Care, Other (non HMO) | Admitting: Internal Medicine

## 2015-09-22 ENCOUNTER — Ambulatory Visit (HOSPITAL_COMMUNITY): Payer: Managed Care, Other (non HMO) | Attending: Cardiology

## 2015-09-22 DIAGNOSIS — I2510 Atherosclerotic heart disease of native coronary artery without angina pectoris: Secondary | ICD-10-CM | POA: Insufficient documentation

## 2015-09-22 DIAGNOSIS — R0609 Other forms of dyspnea: Secondary | ICD-10-CM | POA: Diagnosis not present

## 2015-09-22 DIAGNOSIS — R9439 Abnormal result of other cardiovascular function study: Secondary | ICD-10-CM | POA: Diagnosis not present

## 2015-09-22 DIAGNOSIS — E119 Type 2 diabetes mellitus without complications: Secondary | ICD-10-CM | POA: Diagnosis not present

## 2015-09-22 DIAGNOSIS — I517 Cardiomegaly: Secondary | ICD-10-CM | POA: Insufficient documentation

## 2015-09-22 DIAGNOSIS — R079 Chest pain, unspecified: Secondary | ICD-10-CM | POA: Insufficient documentation

## 2015-09-22 DIAGNOSIS — R5383 Other fatigue: Secondary | ICD-10-CM | POA: Diagnosis not present

## 2015-09-22 LAB — MYOCARDIAL PERFUSION IMAGING
CHL CUP NUCLEAR SRS: 7
CHL CUP NUCLEAR SSS: 12
CSEPED: 3 min
CSEPHR: 78 %
Estimated workload: 4.9 METS
Exercise duration (sec): 0 s
LHR: 0.52
LV dias vol: 266 mL
LV sys vol: 209 mL
MPHR: 164 {beats}/min
Peak HR: 129 {beats}/min
RPE: 19
Rest HR: 88 {beats}/min
SDS: 5
TID: 1

## 2015-09-22 MED ORDER — TECHNETIUM TC 99M SESTAMIBI GENERIC - CARDIOLITE
10.2000 | Freq: Once | INTRAVENOUS | Status: AC | PRN
  Administered 2015-09-22: 10 via INTRAVENOUS

## 2015-09-22 MED ORDER — REGADENOSON 0.4 MG/5ML IV SOLN
0.4000 mg | Freq: Once | INTRAVENOUS | Status: AC
  Administered 2015-09-22: 0.4 mg via INTRAVENOUS

## 2015-09-22 MED ORDER — TECHNETIUM TC 99M SESTAMIBI GENERIC - CARDIOLITE
32.8000 | Freq: Once | INTRAVENOUS | Status: AC | PRN
  Administered 2015-09-22: 32.8 via INTRAVENOUS

## 2015-09-22 NOTE — Progress Notes (Signed)
Patient Care Team: Dorothey Baseman, MD as PCP - General (Family Medicine) [name], MD as Consulting Physician (General Surgery)   HPI  Vincent Brown is a 54 y.o. male Seen as an add-on today because of exercise associated hypotension or Myoview scanning.  CAD s/p CABG LIMA - LAD, SVG-OM2, SVG-PDA (11/2010) and CHF.    Had a NSTEMI in January 2012 after presenting with bad cough. Had cath at [facility]. LAD 100% LCX 70% RCA p70%, d100%. EF 25%. Cardiac MRI: EF 15% anterior wall and inferior wall infarct with significant viability.  ECHO 12/2011: EF 25-30% ECHO 03/14/13 EF 25% ECHO 12/15: EF 25%, diffuse   Records and Results Reviewed   Past Medical History  Diagnosis Date  . Coronary artery disease 2012  . Diabetes mellitus 2003  . Hyperlipidemia   . Ischemic cardiomyopathy   . Chronic renal insufficiency   . Shingles 2003    Past Surgical History  Procedure Laterality Date  . Varicose vein surgery  1995    right lower extremity  . Coronary artery bypass graft  11/05/2010  . Appendectomy  1971  . Refractive surgery  2009, 2015  . Colonoscopy  2015    Current Outpatient Prescriptions  Medication Sig Dispense Refill  . Acetaminophen 500 MG coapsule Take 500 mg by mouth every 4 (four) hours as needed.      Marland Kitchen aspirin EC 81 MG tablet Take 1 tablet (81 mg total) by mouth daily. 90 tablet 3  . celecoxib (CELEBREX) 200 MG capsule Take 200 mg by mouth 2 (two) times daily as needed.    . cyclobenzaprine (FLEXERIL) 10 MG tablet Take 10 mg by mouth at bedtime.    . furosemide (LASIX) 20 MG tablet Take 20 mg tablet as needed for increased weight gain. 20 tablet 6  . glimepiride (AMARYL) 4 MG tablet Take two tablets by mouth daily.    Marland Kitchen levofloxacin (LEVAQUIN) 500 MG tablet take 1 tablet by mouth once daily for 14 days  0  . metoprolol succinate (TOPROL-XL) 50 MG 24 hr tablet Take 75 mg (1.5 tabs) daily. 45 tablet 6  . silver sulfADIAZINE (SILVADENE) 1 % cream Apply  topically 2 (two) times daily. Apply to affected area twice a day 400 g 3  . simvastatin (ZOCOR) 40 MG tablet Take 1 tablet (40 mg total) by mouth at bedtime. 30 tablet 6  . spironolactone (ALDACTONE) 25 MG tablet Take 12.5 mg by mouth daily.    . traMADol (ULTRAM) 50 MG tablet take 1 tablet by mouth every 8 hours if needed for pain  0  . traMADol (ULTRAM) 50 MG tablet Take 1 tablet (50 mg total) by mouth every 6 (six) hours as needed. 30 tablet 0   No current facility-administered medications for this visit.    No Known Allergies    Review of Systems negative except from HPI and PMH  Physical Exam Vital signs are recorded on the stress test Well developed and well nourished in no acute distress HENT normal E scleral and icterus clear Neck Supple JVP flat; carotids brisk and full Clear to ausculation  *Regular rate and rhythm, no murmurs gallops or rub Soft with active bowel sounds No clubbing cyanosis  Edema Alert and oriented, grossly normal motor and sensory function Skin Warm and Dry    Assessment and  Plan  Abnormal Myoview  The patient has ischemic heart disease and had significant perfusion abnormalities on scanning; however, most notable was a  blood pressure drop 110--68 with exercise.  I spoke with Dr. DM. The patient is agreeable to having Dr. DM talked to him about a catheterization next week. Reviewing the chart, the patient has been reluctant to follow through with recommendations percent past

## 2015-09-29 ENCOUNTER — Encounter (HOSPITAL_COMMUNITY): Payer: Self-pay | Admitting: *Deleted

## 2015-10-03 ENCOUNTER — Other Ambulatory Visit (HOSPITAL_COMMUNITY): Payer: Self-pay | Admitting: *Deleted

## 2015-10-03 DIAGNOSIS — I509 Heart failure, unspecified: Secondary | ICD-10-CM

## 2015-10-08 ENCOUNTER — Telehealth (HOSPITAL_COMMUNITY): Payer: Self-pay | Admitting: *Deleted

## 2015-10-08 NOTE — Telephone Encounter (Signed)
Pt wanted to let us know he is being treated for an ulcer on his right leg, he states he is going to the MD every week for a special dressing change and we wanted to make sure it was ok to still have his heart cath on 12/19, advised that was fine

## 2015-10-17 ENCOUNTER — Telehealth (HOSPITAL_COMMUNITY): Payer: Self-pay | Admitting: *Deleted

## 2015-10-17 NOTE — Telephone Encounter (Signed)
Vincent Brown does not require pre cert for left heart cath scheduled 12/19  Ref #[id number]

## 2015-10-20 ENCOUNTER — Encounter (HOSPITAL_COMMUNITY)
Admission: AD | Disposition: A | Discharge status: Home Health | Payer: Self-pay | Source: Ambulatory Visit | Attending: Internal Medicine

## 2015-10-20 ENCOUNTER — Ambulatory Visit (HOSPITAL_COMMUNITY): Payer: Managed Care, Other (non HMO)

## 2015-10-20 ENCOUNTER — Inpatient Hospital Stay (HOSPITAL_COMMUNITY)
Admission: AD | Admit: 2015-10-20 | Discharge: 2015-10-25 | DRG: 982 | Disposition: A | Discharge status: Home Health | Payer: Managed Care, Other (non HMO) | Source: Ambulatory Visit | Attending: Internal Medicine | Admitting: Internal Medicine

## 2015-10-20 ENCOUNTER — Encounter (HOSPITAL_COMMUNITY): Payer: Self-pay | Admitting: Physician Assistant

## 2015-10-20 DIAGNOSIS — I255 Ischemic cardiomyopathy: Secondary | ICD-10-CM | POA: Diagnosis present

## 2015-10-20 DIAGNOSIS — E1169 Type 2 diabetes mellitus with other specified complication: Secondary | ICD-10-CM | POA: Diagnosis present

## 2015-10-20 DIAGNOSIS — K648 Other hemorrhoids: Secondary | ICD-10-CM | POA: Diagnosis present

## 2015-10-20 DIAGNOSIS — K5730 Diverticulosis of large intestine without perforation or abscess without bleeding: Secondary | ICD-10-CM | POA: Diagnosis present

## 2015-10-20 DIAGNOSIS — D509 Iron deficiency anemia, unspecified: Principal | ICD-10-CM | POA: Diagnosis present

## 2015-10-20 DIAGNOSIS — D649 Anemia, unspecified: Secondary | ICD-10-CM | POA: Diagnosis not present

## 2015-10-20 DIAGNOSIS — M869 Osteomyelitis, unspecified: Secondary | ICD-10-CM | POA: Diagnosis present

## 2015-10-20 DIAGNOSIS — E11622 Type 2 diabetes mellitus with other skin ulcer: Secondary | ICD-10-CM | POA: Diagnosis present

## 2015-10-20 DIAGNOSIS — L97919 Non-pressure 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 lower leg with unspecified severity: Secondary | ICD-10-CM | POA: Diagnosis present

## 2015-10-20 DIAGNOSIS — E1122 Type 2 diabetes mellitus with diabetic chronic kidney disease: Secondary | ICD-10-CM | POA: Diagnosis present

## 2015-10-20 DIAGNOSIS — R0609 Other forms of dyspnea: Secondary | ICD-10-CM | POA: Insufficient documentation

## 2015-10-20 DIAGNOSIS — S81801A Unspecified open wound, right lower leg, initial encounter: Secondary | ICD-10-CM | POA: Diagnosis not present

## 2015-10-20 DIAGNOSIS — I2510 Atherosclerotic heart disease of native coronary artery without angina pectoris: Secondary | ICD-10-CM | POA: Diagnosis present

## 2015-10-20 DIAGNOSIS — I509 Heart failure, unspecified: Secondary | ICD-10-CM | POA: Diagnosis not present

## 2015-10-20 DIAGNOSIS — L97913 Non-pressure 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 lower leg with necrosis of muscle: Secondary | ICD-10-CM

## 2015-10-20 DIAGNOSIS — R9439 Abnormal result of other cardiovascular function study: Secondary | ICD-10-CM | POA: Insufficient documentation

## 2015-10-20 DIAGNOSIS — M86161 Other acute osteomyelitis, right tibia and fibula: Secondary | ICD-10-CM | POA: Diagnosis not present

## 2015-10-20 DIAGNOSIS — R931 Abnormal findings on diagnostic imaging of heart and coronary circulation: Secondary | ICD-10-CM | POA: Diagnosis not present

## 2015-10-20 DIAGNOSIS — T148XXA Other injury of unspecified body region, initial encounter: Secondary | ICD-10-CM

## 2015-10-20 DIAGNOSIS — Z794 Long term (current) use of insulin: Secondary | ICD-10-CM | POA: Diagnosis not present

## 2015-10-20 DIAGNOSIS — L97912 Non-pressure 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 lower leg with fat layer exposed: Secondary | ICD-10-CM | POA: Diagnosis not present

## 2015-10-20 DIAGNOSIS — D500 Iron deficiency anemia secondary to blood loss (chronic): Secondary | ICD-10-CM | POA: Diagnosis not present

## 2015-10-20 DIAGNOSIS — E785 Hyperlipidemia, unspecified: Secondary | ICD-10-CM | POA: Diagnosis present

## 2015-10-20 DIAGNOSIS — Z7982 Long term (current) use of aspirin: Secondary | ICD-10-CM | POA: Diagnosis not present

## 2015-10-20 DIAGNOSIS — T148 Other injury of unspecified body region: Secondary | ICD-10-CM | POA: Diagnosis not present

## 2015-10-20 DIAGNOSIS — I5022 Chronic systolic (congestive) heart failure: Secondary | ICD-10-CM | POA: Diagnosis present

## 2015-10-20 DIAGNOSIS — L089 Local infection of the skin and subcutaneous tissue, unspecified: Secondary | ICD-10-CM | POA: Insufficient documentation

## 2015-10-20 DIAGNOSIS — I25810 Atherosclerosis of coronary artery bypass graft(s) without angina pectoris: Secondary | ICD-10-CM | POA: Diagnosis present

## 2015-10-20 DIAGNOSIS — K2960 Other gastritis without bleeding: Secondary | ICD-10-CM | POA: Diagnosis present

## 2015-10-20 HISTORY — DX: Other non-diabetic proliferative retinopathy, unspecified eye: H35.20

## 2015-10-20 LAB — PROTIME-INR
INR: 1.29 (ref 0.00–1.49)
PROTHROMBIN TIME: 16.2 s — AB (ref 11.6–15.2)

## 2015-10-20 LAB — BASIC METABOLIC PANEL
Anion gap: 7 (ref 5–15)
BUN: 20 mg/dL (ref 6–20)
CHLORIDE: 103 mmol/L (ref 101–111)
CO2: 27 mmol/L (ref 22–32)
Calcium: 8.4 mg/dL — ABNORMAL LOW (ref 8.9–10.3)
Creatinine, Ser: 0.99 mg/dL (ref 0.61–1.24)
GFR calc Af Amer: >60 mL/min (ref >60)
GFR calc non Af Amer: >60 mL/min (ref >60)
GLUCOSE: 78 mg/dL (ref 65–99)
POTASSIUM: 4.2 mmol/L (ref 3.5–5.1)
Sodium: 137 mmol/L (ref 135–145)

## 2015-10-20 LAB — GLUCOSE, CAPILLARY
GLUCOSE-CAPILLARY: 67 mg/dL (ref 65–99)
GLUCOSE-CAPILLARY: 121 mg/dL — AB (ref 65–99)
GLUCOSE-CAPILLARY: 137 mg/dL — AB (ref 65–99)
GLUCOSE-CAPILLARY: 140 mg/dL — AB (ref 65–99)
GLUCOSE-CAPILLARY: 143 mg/dL — AB (ref 65–99)

## 2015-10-20 LAB — CBC
HEMATOCRIT: 28.5 % — AB (ref 39.0–52.0)
Hemoglobin: 7.5 g/dL — ABNORMAL LOW (ref 13.0–17.0)
MCH: 20.7 pg — ABNORMAL LOW (ref 26.0–34.0)
MCHC: 26.3 g/dL — AB (ref 30.0–36.0)
MCV: 78.7 fL (ref 78.0–100.0)
Platelets: 232 10*3/uL (ref 150–400)
RBC: 3.62 MIL/uL — ABNORMAL LOW (ref 4.22–5.81)
RDW: 19.9 % — AB (ref 11.5–15.5)
WBC: 6.7 10*3/uL (ref 4.0–10.5)

## 2015-10-20 LAB — IRON AND TIBC
Iron: 159 ug/dL (ref 45–182)
SATURATION RATIOS: 42 % — AB (ref 17.9–39.5)
TIBC: 375 ug/dL (ref 250–450)
UIBC: 216 ug/dL

## 2015-10-20 LAB — RETICULOCYTES
RBC.: 4.08 MIL/uL — ABNORMAL LOW (ref 4.22–5.81)
RETIC CT PCT: 2.4 % (ref 0.4–3.1)
Retic Count, Absolute: 97.9 10*3/uL (ref 19.0–186.0)

## 2015-10-20 LAB — VITAMIN B12: Vitamin B-12: 417 pg/mL (ref 180–914)

## 2015-10-20 LAB — FERRITIN: FERRITIN: 42 ng/mL (ref 24–336)

## 2015-10-20 LAB — PREPARE RBC (CROSSMATCH)

## 2015-10-20 LAB — OCCULT BLOOD X 1 CARD TO LAB, STOOL: Fecal Occult Bld: NEGATIVE

## 2015-10-20 LAB — FOLATE: Folate: 14.6 ng/mL (ref >5.9)

## 2015-10-20 LAB — ABO/RH: ABO/RH(D): O POS

## 2015-10-20 SURGERY — LEFT HEART CATH AND CORS/GRAFTS ANGIOGRAPHY
Anesthesia: LOCAL

## 2015-10-20 MED ORDER — METOPROLOL SUCCINATE ER 50 MG PO TB24
75.0000 mg | ORAL_TABLET | Freq: Every day | ORAL | Status: DC
  Administered 2015-10-20 – 2015-10-25 (×5): 75 mg via ORAL
  Filled 2015-10-20 (×6): qty 1

## 2015-10-20 MED ORDER — TRAMADOL HCL 50 MG PO TABS
50.0000 mg | ORAL_TABLET | Freq: Four times a day (QID) | ORAL | Status: DC | PRN
  Administered 2015-10-25 (×2): 50 mg via ORAL
  Filled 2015-10-20 (×2): qty 1

## 2015-10-20 MED ORDER — CYCLOBENZAPRINE HCL 10 MG PO TABS
10.0000 mg | ORAL_TABLET | Freq: Every day | ORAL | Status: DC
  Administered 2015-10-21 – 2015-10-24 (×4): 10 mg via ORAL
  Filled 2015-10-20 (×5): qty 1

## 2015-10-20 MED ORDER — SODIUM CHLORIDE 0.9 % IJ SOLN
3.0000 mL | Freq: Two times a day (BID) | INTRAMUSCULAR | Status: DC
  Administered 2015-10-20 – 2015-10-25 (×5): 3 mL via INTRAVENOUS

## 2015-10-20 MED ORDER — SODIUM CHLORIDE 0.9 % IJ SOLN: 3.0000 mL | INTRAMUSCULAR | Status: DC | PRN

## 2015-10-20 MED ORDER — SODIUM CHLORIDE 0.9 % IV SOLN
250.0000 mL | INTRAVENOUS | Status: DC | PRN
  Administered 2015-10-21: 10:00:00 via INTRAVENOUS

## 2015-10-20 MED ORDER — SODIUM CHLORIDE 0.9 % IV SOLN
INTRAVENOUS | Status: DC
  Administered 2015-10-20: 06:00:00 via INTRAVENOUS

## 2015-10-20 MED ORDER — ACETAMINOPHEN 500 MG PO CAPS: 500.0000 mg | ORAL_CAPSULE | ORAL | Status: DC | PRN

## 2015-10-20 MED ORDER — SIMVASTATIN 40 MG PO TABS
40.0000 mg | ORAL_TABLET | Freq: Every day | ORAL | Status: DC
  Administered 2015-10-20 – 2015-10-24 (×5): 40 mg via ORAL
  Filled 2015-10-20 (×5): qty 1

## 2015-10-20 MED ORDER — SPIRONOLACTONE 25 MG PO TABS
12.5000 mg | ORAL_TABLET | Freq: Every day | ORAL | Status: DC
  Administered 2015-10-20 – 2015-10-21 (×2): 12.5 mg via ORAL
  Filled 2015-10-20 (×2): qty 1

## 2015-10-20 MED ORDER — DEXTROSE 50 % IV SOLN
INTRAVENOUS | Status: AC
  Administered 2015-10-20: 25 mL via INTRAVENOUS
  Filled 2015-10-20: qty 50

## 2015-10-20 MED ORDER — GLIMEPIRIDE 4 MG PO TABS
8.0000 mg | ORAL_TABLET | Freq: Every day | ORAL | Status: DC
  Administered 2015-10-23 – 2015-10-25 (×2): 8 mg via ORAL
  Filled 2015-10-20 (×6): qty 2

## 2015-10-20 MED ORDER — DEXTROSE 50 % IV SOLN
25.0000 mL | Freq: Once | INTRAVENOUS | Status: AC
  Administered 2015-10-20: 25 mL via INTRAVENOUS

## 2015-10-20 MED ORDER — ASPIRIN EC 81 MG PO TBEC
81.0000 mg | DELAYED_RELEASE_TABLET | Freq: Every day | ORAL | Status: DC
Start: 2015-10-21 — End: 2015-10-25
  Administered 2015-10-21 – 2015-10-25 (×5): 81 mg via ORAL
  Filled 2015-10-20 (×7): qty 1

## 2015-10-20 MED ORDER — ASPIRIN 81 MG PO CHEW: 81.0000 mg | CHEWABLE_TABLET | ORAL | Status: AC

## 2015-10-20 MED ORDER — SODIUM CHLORIDE 0.9 % IV SOLN: 250.0000 mL | INTRAVENOUS | Status: DC | PRN

## 2015-10-20 MED ORDER — SODIUM CHLORIDE 0.9 % IV SOLN
Freq: Once | INTRAVENOUS | Status: AC
  Administered 2015-10-20: 13:00:00 via INTRAVENOUS

## 2015-10-20 MED ORDER — INSULIN ASPART 100 UNIT/ML ~~LOC~~ SOLN
0.0000 [IU] | Freq: Three times a day (TID) | SUBCUTANEOUS | Status: DC
  Administered 2015-10-20 (×2): 2 [IU] via SUBCUTANEOUS
  Administered 2015-10-21: 3 [IU] via SUBCUTANEOUS
  Administered 2015-10-22: 2 [IU] via SUBCUTANEOUS
  Administered 2015-10-23: 3 [IU] via SUBCUTANEOUS
  Administered 2015-10-23 – 2015-10-25 (×3): 2 [IU] via SUBCUTANEOUS

## 2015-10-20 MED ORDER — ACETAMINOPHEN 325 MG PO TABS: 650.0000 mg | ORAL_TABLET | ORAL | Status: DC | PRN

## 2015-10-20 MED ORDER — ONDANSETRON HCL 4 MG/2ML IJ SOLN: 4.0000 mg | Freq: Four times a day (QID) | INTRAMUSCULAR | Status: DC | PRN

## 2015-10-20 MED ORDER — SODIUM CHLORIDE 0.9 % IJ SOLN
3.0000 mL | Freq: Two times a day (BID) | INTRAMUSCULAR | Status: DC
  Administered 2015-10-20 – 2015-10-25 (×6): 3 mL via INTRAVENOUS

## 2015-10-20 MED ORDER — PIPERACILLIN-TAZOBACTAM 3.375 G IVPB
3.3750 g | Freq: Three times a day (TID) | INTRAVENOUS | Status: DC
  Administered 2015-10-20 – 2015-10-23 (×9): 3.375 g via INTRAVENOUS
  Filled 2015-10-20 (×12): qty 50

## 2015-10-20 NOTE — H&P (Addendum)
Advanced Heart Failure Team History and Physical Note   Primary Physician: Dr Terance Hart    Reason for Admission: Symptomatic Anemia    HPI:    [name] is a 54 year old with a history of DM2, HL, CRI (1.4), severe venous insufficiency with previous R leg ulcers requiring vein stripping, CAD s/p CABG LIMA - LAD, SVG-OM2, SVG-PDA (11/2010) and CHF. Unable to tolerate lisinopril /ARB due to dizziness.  Had a NSTEMI in January 2012 after presenting with bad cough. Had cath at [facility]. LAD 100% LCX 70% RCA p70%, d100%. EF 25%. Cardiac MRI: EF 15% anterior wall and inferior wall infarct with significant viability.  Today he presented to [facility] for LHC due to an abnormal myoview. On 09/22/15 he had an abnormal myoview with perfusion abnormalities noted and  SBP dropping 100-->68 with exercise. Hgb was 7.5 so cath was canceled. Doing ok.  Denies CP or bleeding problems.    Review of Systems: [y] = yes, [ ]  = no   General: Weight gain [ ] ; Weight loss [ ] ; Anorexia [ ] ; Fatigue [ Y]; Fever [ ] ; Chills [ ] ; Weakness [ ]   Cardiac: Chest pain/pressure [ ] ; Resting SOB [ ] ; Exertional SOB [ ] ; Orthopnea [ ] ; Pedal Edema [ ] ; Palpitations [ ] ; Syncope [ ] ; Presyncope [ ] ; Paroxysmal nocturnal dyspnea[ ]   Pulmonary: Cough [ ] ; Wheezing[ ] ; Hemoptysis[ ] ; Sputum [ ] ; Snoring [ ]   GI: Vomiting[ ] ; Dysphagia[ ] ; Melena[ ] ; Hematochezia [ ] ; Heartburn[ ] ; Abdominal pain [ ] ; Constipation [ ] ; Diarrhea [ ] ; BRBPR [ ]   GU: Hematuria[ ] ; Dysuria [ ] ; Nocturia[ ]   Vascular: Pain in legs with walking [ ] ; Pain in feet with lying flat [ ] ; Non-healing sores [ ] ; Stroke [ ] ; TIA [ ] ; Slurred speech [ ] ;  Neuro: Headaches[ ] ; Vertigo[ ] ; Seizures[ ] ; Paresthesias[ ] ;Blurred vision [ ] ; Diplopia [ ] ; Vision changes [ ]   Ortho/Skin: Arthritis [ ] ; Joint pain [ ] ; Muscle pain [ ] ; Joint swelling [ ] ; Back Pain [ ] ; Rash [ ]   Psych: Depression[ ] ; Anxiety[ ]   Heme: Bleeding problems [ ] ; Clotting disorders [ ] ; Anemia [ ]    Endocrine: Diabetes [Y ]; Thyroid dysfunction[ ]   Home Medications Prior to Admission medications   Medication Sig Start Date End Date Taking? Authorizing Provider  Acetaminophen 500 MG coapsule Take 500 mg by mouth every 4 (four) hours as needed.     Yes Historical Provider, MD  aspirin EC 81 MG tablet Take 1 tablet (81 mg total) by mouth daily. 03/10/15  Yes Laurey Morale, MD  cyclobenzaprine (FLEXERIL) 10 MG tablet Take 10 mg by mouth at bedtime.   Yes Historical Provider, MD  furosemide (LASIX) 20 MG tablet Take 20 mg tablet as needed for increased weight gain. Patient taking differently: Take 20 mg by mouth daily as needed for fluid or edema. Take 20 mg tablet as needed for increased weight gain. 03/28/14  Yes Aundria Rud, NP  glimepiride (AMARYL) 4 MG tablet Take 8 mg by mouth daily with breakfast. Take two tablets by mouth daily.   Yes Historical Provider, MD  metoprolol succinate (TOPROL-XL) 50 MG 24 hr tablet Take 75 mg (1.5 tabs) daily. 04/08/15  Yes Dolores Patty, MD  simvastatin (ZOCOR) 40 MG tablet Take 1 tablet (40 mg total) by mouth at bedtime. 03/28/14  Yes Aundria Rud, NP  spironolactone (ALDACTONE) 25 MG tablet Take 12.5 mg by mouth daily.  Yes Historical Provider, MD  traMADol (ULTRAM) 50 MG tablet Take 1 tablet (50 mg total) by mouth every 6 (six) hours as needed. Patient taking differently: Take 50 mg by mouth every 6 (six) hours as needed (pain).  08/25/15 08/24/16 Yes [name], MD    Past Medical History: Past Medical History  Diagnosis Date  . Coronary artery disease 2012  . Diabetes mellitus 2003  . Hyperlipidemia   . Ischemic cardiomyopathy   . Chronic renal insufficiency   . Shingles 2003    Past Surgical History: Past Surgical History  Procedure Laterality Date  . Varicose vein surgery  1995    right lower extremity  . Coronary artery bypass graft  11/05/2010  . Appendectomy  1971  . Refractive surgery  2009, 2015  . Colonoscopy  2015     Family History: Family History  Problem Relation Age of Onset  . Hypertension Father     Social History: Social History   Social History  . Marital Status: Married    Spouse Name: N/A  . Number of Children: N/A  . Years of Education: N/A   Social History Main Topics  . Smoking status: Never Smoker   . Smokeless tobacco: Never Used  . Alcohol Use: No  . Drug Use: No  . Sexual Activity: Not on file   Other Topics Concern  . Not on file   Social History Narrative    Allergies:  No Known Allergies  Objective:    Vital Signs:   Temp:  [98.1 F (36.7 C)] 98.1 F (36.7 C) (12/19 0538) Pulse Rate:  [93] 93 (12/19 0538) Resp:  [18] 18 (12/19 0538) BP: (104)/(69) 104/69 mmHg (12/19 0538) SpO2:  [100 %] 100 % (12/19 0538) Weight:  [160 lb (72.576 kg)] 160 lb (72.576 kg) (12/19 0538)   Filed Weights   10/20/15 0538  Weight: 160 lb (72.576 kg)    Physical Exam: General:  Well appearing. No resp difficulty HEENT: normal Neck: supple. JVP 6-7  . Carotids 2+ bilat; no bruits. No lymphadenopathy or thryomegaly appreciated. Cor: PMI nondisplaced. Regular rate & rhythm. No rubs, gallops or murmurs. Lungs: clear Abdomen: soft, nontender, nondistended. No hepatosplenomegaly. No bruits or masses. Good bowel sounds. Extremities: no cyanosis, clubbing, rash, RLE unna boot + odor Neuro: alert & orientedx3, cranial nerves grossly intact. moves all 4 extremities w/o difficulty. Affect pleasant  Telemetry:   Labs: Basic Metabolic Panel:  Recent Labs Lab 10/20/15 0638  NA 137  K 4.2  CL 103  CO2 27  GLUCOSE 78  BUN 20  CREATININE 0.99  CALCIUM 8.4*    Liver Function Tests: No results for input(s): AST, ALT, ALKPHOS, BILITOT, PROT, ALBUMIN in the last 168 hours. No results for input(s): LIPASE, AMYLASE in the last 168 hours. No results for input(s): AMMONIA in the last 168 hours.  CBC:  Recent Labs Lab 10/20/15 0638  WBC 6.7  HGB 7.5*  HCT 28.5*  MCV  78.7  PLT 232    Cardiac Enzymes: No results for input(s): CKTOTAL, CKMB, CKMBINDEX, TROPONINI in the last 168 hours.  BNP: BNP (last 3 results) No results for input(s): BNP in the last 8760 hours.  ProBNP (last 3 results) No results for input(s): PROBNP in the last 8760 hours.   CBG:  Recent Labs Lab 10/20/15 0606 10/20/15 0643  GLUCAP 67 121*    Coagulation Studies:  Recent Labs  10/20/15 0638  LABPROT 16.2*  INR 1.29    Other results: EKG:  NSR 90. Nonspecific T wave abnormality  Imaging:  No results found.       Assessment:   1. Symptomatic Microcytic Anemia 2. Abnormal Myoview- 11/21/201/6 CAD s/p CABG LIMA - LAD, SVG-OM2, SVG-PDA (11/2010)    -1. Fixed medium-sized, severe basal to mid inferior and inferoseptal perfusion defect suggestive of infarction. Partially reversible medium-sized, moderate-intensity mid anterior and anteroseptal perfusion defect suggestive of infarction with significant peri-infarct ischemia.  2. EF 21% with diffuse hypokinesis, worse inferiorly.  3. Chronic Systolic Heart Failure - ICM EF 20% 4. DMII 5. Hyperlipidemia  6. RLE wound, nonhealing   Plan/Discussion:     Today he presented for LHC due to abnormal myoview and increased exercise intolerance however hemoglobin was 7.5 so cath was cancelled. Admit today and transfuse  2U PRBCs. May need GI consult. For now continue aspirin  Consider cath in a few days.   Continue home HF meds. He is intolerant ace/arb due to dizziness. Repeat ECHO   Consult WOC for RLE wound present over 6 months.   Admit to telemetry.   Length of Stay:    Vincent Becket NP-C  10/20/2015, 8:29 AM  Advanced Heart Failure Team Pager [phone] (M-F; 7a - 4p)  Please contact [facility] for night-coverage after hours (4p -7a ) and weekends on [email or web address]  Patient seen and examined with Vincent Becket, NP. We discussed all aspects of the encounter. I agree with the assessment and plan as stated  above.   He has had worsening exercise intolerance over past few weeks. Myoview with large scar and some peri-infarct ischemia. However found to have severe microcytic anemia. Will admit. Check iron stores and FOBT. Will need GI to see. Consider R&L cath later in the week.   Has severe non-healing RLE wound followed by [facility] in [place]. Will aske wound care and [facility] to see. Femoral pulses are good. Will likely need ABIs.   Bensimhon, Daniel,MD 12:23 PM

## 2015-10-20 NOTE — Consult Note (Addendum)
WOC wound consult note Reason for Consult: Consult requested for RLE.  Pt is followed by a Vascular physician at [facility] and has been ordered to war Una boots to RLE which are changed Q week.  Pt states they are due to be changed at this time.  Mod amt green drainage has leaked through current compression wrap and there is a strong foul odor. Wound type: 2 full thickness wounds to right calf.  2X1X.2cm and 14X6X.3cm.  Both sites are 10% yellow, 90% red, with large amt green drainage with very strong odor. Few scattered areas of red raised growth of unknown etiology in wound bed. Pt states this is the usual appearance of his wound and the drainage from the site.   Periwound: Generalized hemosiderin staining surrounding wound; appearance consistent with venous stasis changes. Dressing procedure/placement/frequency: Applied Aquacel dressing to absorb drainage and provide antimicrobial benefits.  Paged ortho tech to apply [employer] and coban over the dressing to continue present plan of care with compression therapy. Plan to change next Mon if still in the hospital at that time. Pt states he will follow-up with his physician at [facility] after discharge. Please re-consult if further assistance is needed.  Thank-you,  Cammie Mcgee MSN, RN, CWOCN, [place], CNS [phone]

## 2015-10-20 NOTE — Consult Note (Signed)
Vincent Brown: 12:40 PM 10/20/2015  LOS: 0 days    Referring Provider: Dr Vincent Brown  Primary Care Physician:  Vincent Brown Primary Gastroenterologist:  Vincent Brown.   [facility] clinic, Dr Vincent Brown, screening colonoscopy 2015.     Reason for Consultation:  Anemia.    HPI: Vincent Brown is a 54 y.o. male.  Hx CAD, s/p CABG 2012, Ischemic CM, EF 21%, restrictive CM.  LE venous insufficiency with right leg ulcers.   2015 colonoscopy.  Screening study.  [facility].  Wife and patient recall no abnormalities including no diverticulosis, no colon polyps. Was placed on a 10 year follow-up rescreening schedule. Anemia, baseline ~ 9.0 in 2012 but this may have been around the time that he had his bypass surgery. Low iron/TIBC/iron sats, elevated Ferritin in 2012.   Hgb 7.1, MCV 78 on 09/17/15.  This was the first time he and his wife never heard anything about a diagnosis of anemia.  Plan was to have him come back to the office for repeat labs which the patient did not do, apparently he had other things to do.  His wife did go ahead and purchase some over-the-counter iron which she had him start a few weeks back. 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 Since the summer, the patient has had increase in fatigue with work that previously caused him no issues. Also some dyspnea on exertion. He denies dizziness, chest pain, cardiac palpitations.  Abnormal myoview on 09/22/15.  Presented today for planned cardiac cath. This was postponed due to worsening anemia. Hgb is 7.5.  MCV 78.  2 units PRBCs have been ordered to transfuse today. Renal function normal.  INR 1.2.  Patient takes a single, low-dose aspirin  daily. Doesn't use any NSAIDs. Appetite is great. Weight is stable. No dysphagia. Stools are brown and occur once or twice daily. No nausea, no GI upset or abdominal pain. No nose bleeds. Has some tendency to form purpura on his arms if he scratches. No family history of colorectal cancer or disease. No family history of anemias.  Patient does say that he read up on a medication Avastin which has been injected intraocular for mgt of proliferative retinopathy, and it can cause hemorrhage (when used IV).  He wonders if this drug is causing GI bleeding, anemia.    Past Medical History  Diagnosis Date  . Coronary artery disease 2012  . Diabetes mellitus 2003  . Hyperlipidemia   . Ischemic cardiomyopathy   . Chronic renal insufficiency   . Shingles 2003    Past Surgical History  Procedure Laterality Date  . Varicose vein surgery  1995    right lower extremity  . Coronary artery bypass graft  11/05/2010  . Appendectomy  1971  . Refractive surgery  2009, 2015  . Colonoscopy  2015    Prior to Admission medications   Medication Sig Start Date End Date Taking?  Authorizing Provider  Acetaminophen 500 MG coapsule Take 500 mg by mouth every 4 (four) hours as needed.     Yes Historical Provider, Brown  aspirin EC 81 MG tablet Take 1 tablet (81 mg total) by mouth daily. 03/10/15  Yes Vincent Brown  cyclobenzaprine (FLEXERIL) 10 MG tablet Take 10 mg by mouth at bedtime.   Yes Historical Provider, Brown  furosemide (LASIX) 20 MG tablet Take 20 mg tablet as needed for increased weight gain. Patient taking differently: Take 20 mg by mouth daily as needed for fluid or edema. Take 20 mg tablet as needed for increased weight gain. 03/28/14  Yes Vincent Brown  glimepiride (AMARYL) 4 MG tablet Take 8 mg by mouth daily with breakfast. Take two tablets by mouth daily.   Yes Historical Provider, Brown  metoprolol succinate (TOPROL-XL) 50 MG 24 hr tablet Take 75 mg (1.5 tabs) daily. 04/08/15  Yes Vincent Patty,  Brown  simvastatin (ZOCOR) 40 MG tablet Take 1 tablet (40 mg total) by mouth at bedtime. 03/28/14  Yes Vincent Brown  spironolactone (ALDACTONE) 25 MG tablet Take 12.5 mg by mouth daily.   Yes Historical Provider, Brown  traMADol (ULTRAM) 50 MG tablet Take 1 tablet (50 mg total) by mouth every 6 (six) hours as needed. Patient taking differently: Take 50 mg by mouth every 6 (six) hours as needed (pain).  08/25/15 08/24/16 Yes Vincent Brown    Scheduled Meds: . aspirin  81 mg Oral Pre-Cath  . [START ON 10/21/2015] aspirin EC  81 mg Oral Daily  . cyclobenzaprine  10 mg Oral QHS  . [START ON 10/21/2015] glimepiride  8 mg Oral Q breakfast  . insulin aspart  0-15 Units Subcutaneous TID WC  . metoprolol succinate  75 mg Oral Daily  . simvastatin  40 mg Oral QHS  . sodium chloride  3 mL Intravenous Q12H  . sodium chloride  3 mL Intravenous Q12H  . spironolactone  12.5 mg Oral Daily   Infusions: . sodium chloride 10 mL/hr at 10/20/15 0627   PRN Meds: sodium chloride, sodium chloride, acetaminophen, ondansetron (ZOFRAN) IV, sodium chloride, sodium chloride, traMADol   Allergies as of 09/29/2015  . (No Known Allergies)    Family History  Problem Relation Age of Onset  . Hypertension Father     Social History   Social History  . Marital Status: Married    Spouse Name: N/A  . Number of Children: N/A  . Years of Education: N/A   Occupational History  . Not on file.   Social History Main Topics  . Smoking status: Never Smoker   . Smokeless tobacco: Never Used  . Alcohol Use: No  . Drug Use: No  . Sexual Activity: Not on file   Other Topics Concern  . Not on file   Social History Narrative    REVIEW OF SYSTEMS: Constitutional:  Stable weight. Gen. fatigue. ENT:  No nose bleeds Pulm:  Dyspnea on exertion. Dry cough. No PND CV:  No palpitations, no LE edema.  GU:  No hematuria, no frequency GI:  Per HPI.  No dysphagia Heme:  Per HPI   Transfusions:  None ever  before, unless during bypass.  Neuro:  No headaches, no peripheral tingling or numbness Derm:   Endocrine:  No sweats or chills.  No polyuria or dysuria Immunization:  Not queried Travel:  None beyond local counties in last few months.    PHYSICAL EXAM: Vital signs in  last 24 hours: Filed Vitals:   10/20/15 0538 10/20/15 1116  BP: 104/69 107/65  Pulse: 93 96  Temp: 98.1 F (36.7 C) 97.9 F (36.6 C)  Resp: 18 18   Wt Readings from Last 3 Encounters:  10/20/15 73.936 kg (163 lb)  09/22/15 71.668 kg (158 lb)  08/25/15 71.668 kg (158 lb)    General: Pale, looks older than stated age. Comfortable, pleasant. Head:  No facial asymmetry, swelling. No signs of head trauma.  Eyes:  No icterus, + pale conjunctiva Ears:  Not HOH  Nose:  No discharge.  Mouth:  Moist, clear MM.  Poor dentition.  Neck:  No mass, no JVD, no TMG Lungs:  Clear bil  Heart: RRR.  No MRG Abdomen:  Active BS, soft, NT, ND.  No mass or HSM.   Rectal: formed brown stool in vault, FOBT negative.    Musc/Skeltl: no joint erythema, swelling or contractures Extremities:  Bandages cover sores on right LE.    Neurologic:  Oriented x 3.  No tremor. No limb weakness.  Skin:  Pale.  No telangectasia.   Tattoos:  none Nodes:  No cervical or inguinal adenopathy.   Psych:  Pleasant, calm, not obviously depressed.   Intake/Output from previous day:   Intake/Output this shift: Total I/O In: -  Out: 225 [Urine:225]  LAB RESULTS:  Recent Labs  10/20/15 0638  WBC 6.7  HGB 7.5*  HCT 28.5*  PLT 232   BMET Lab Results  Component Value Date   NA 137 10/20/2015   NA 135 10/09/2012   K 4.2 10/20/2015   K 4.8 10/09/2012   CL 103 10/20/2015   CL 100 10/09/2012   CO2 27 10/20/2015   CO2 27 10/09/2012   GLUCOSE 78 10/20/2015   GLUCOSE 165* 10/09/2012   BUN 20 10/20/2015   BUN 24* 10/09/2012   CREATININE 0.99 10/20/2015   CREATININE 0.92 10/09/2012   CALCIUM 8.4* 10/20/2015   CALCIUM 9.8 10/09/2012    LFT No results for input(s): PROT, ALBUMIN, AST, ALT, ALKPHOS, BILITOT, BILIDIR, IBILI in the last 72 hours. PT/INR Lab Results  Component Value Date   INR 1.29 10/20/2015    RADIOLOGY STUDIES: No results found.  ENDOSCOPIC STUDIES: Per HPI  IMPRESSION:   *  Normocytic anemia.  prevous anemia in 2012. FOBT negative today, no melena, BPR etc.  No adverse GI sxs.  2 units ordered transfused.   *  Ischemic CM.  Left heart cath planned for today, postponed due to anemia.    PLAN:     *  Get colon records from [facility].  Likely EGD, but [name] will decide.    *  May need hematology input.   *  Anemia labs ordered.    Jennye Moccasin  10/20/2015, 12:40 PM Pager: [phone]      Attending physician's note   I have taken a history, examined the patient and reviewed the chart. I agree with the Advanced Practitioner's note, impression and recommendations. 54 year old male with history of ischemic cardiomyopathy and anemia. Patient reports taking Celebrex and NSAID on a regular basis 2 months ago. Never had upper endoscopy, last colonoscopy about 5 years ago was normal per patient, try to obtain the records. We'll plan for EGD tomorrow to evaluate for possible etiology for upper GI blood loss.   Iona Beard, Brown [phone] Mon-Fri 8a-5p [phone] after 5p, weekends, holidays

## 2015-10-21 ENCOUNTER — Inpatient Hospital Stay (HOSPITAL_COMMUNITY): Payer: Managed Care, Other (non HMO)

## 2015-10-21 ENCOUNTER — Encounter (HOSPITAL_COMMUNITY)
Admission: AD | Disposition: A | Discharge status: Home Health | Payer: Self-pay | Source: Ambulatory Visit | Attending: Internal Medicine

## 2015-10-21 ENCOUNTER — Inpatient Hospital Stay (HOSPITAL_COMMUNITY): Payer: Managed Care, Other (non HMO) | Admitting: Anesthesiology

## 2015-10-21 ENCOUNTER — Encounter (HOSPITAL_COMMUNITY): Payer: Self-pay | Admitting: Physician Assistant

## 2015-10-21 ENCOUNTER — Other Ambulatory Visit (HOSPITAL_COMMUNITY): Payer: Managed Care, Other (non HMO)

## 2015-10-21 DIAGNOSIS — D500 Iron deficiency anemia secondary to blood loss (chronic): Secondary | ICD-10-CM

## 2015-10-21 DIAGNOSIS — L97912 Non-pressure 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 lower leg with fat layer exposed: Secondary | ICD-10-CM

## 2015-10-21 DIAGNOSIS — S81801A Unspecified open wound, right lower leg, initial encounter: Secondary | ICD-10-CM

## 2015-10-21 HISTORY — PX: ESOPHAGOGASTRODUODENOSCOPY (EGD) WITH PROPOFOL: SHX5813

## 2015-10-21 LAB — CBC WITH DIFFERENTIAL/PLATELET
BASOS PCT: 1 %
Basophils Absolute: 0 10*3/uL (ref 0.0–0.1)
EOS ABS: 0.1 10*3/uL (ref 0.0–0.7)
EOS PCT: 2 %
HEMATOCRIT: 32.4 % — AB (ref 39.0–52.0)
Hemoglobin: 9 g/dL — ABNORMAL LOW (ref 13.0–17.0)
Lymphocytes Relative: 26 %
Lymphs Abs: 1.6 10*3/uL (ref 0.7–4.0)
MCH: 22.3 pg — ABNORMAL LOW (ref 26.0–34.0)
MCHC: 27.8 g/dL — AB (ref 30.0–36.0)
MCV: 80.2 fL (ref 78.0–100.0)
Monocytes Absolute: 0.5 10*3/uL (ref 0.1–1.0)
Monocytes Relative: 8 %
NEUTROS PCT: 63 %
Neutro Abs: 4 10*3/uL (ref 1.7–7.7)
Platelets: 229 10*3/uL (ref 150–400)
RBC: 4.04 MIL/uL — ABNORMAL LOW (ref 4.22–5.81)
RDW: 19.6 % — AB (ref 11.5–15.5)
WBC: 6.2 10*3/uL (ref 4.0–10.5)

## 2015-10-21 LAB — TYPE AND SCREEN
ABO/RH(D): O POS
Antibody Screen: NEGATIVE
UNIT DIVISION: 0
Unit division: 0

## 2015-10-21 LAB — BASIC METABOLIC PANEL
Anion gap: 8 (ref 5–15)
BUN: 19 mg/dL (ref 6–20)
CALCIUM: 8.8 mg/dL — AB (ref 8.9–10.3)
CHLORIDE: 103 mmol/L (ref 101–111)
CO2: 27 mmol/L (ref 22–32)
CREATININE: 1.18 mg/dL (ref 0.61–1.24)
Glucose, Bld: 77 mg/dL (ref 65–99)
Potassium: 4.6 mmol/L (ref 3.5–5.1)
SODIUM: 138 mmol/L (ref 135–145)

## 2015-10-21 LAB — GLUCOSE, CAPILLARY
GLUCOSE-CAPILLARY: 67 mg/dL (ref 65–99)
Glucose-Capillary: 82 mg/dL (ref 65–99)
Glucose-Capillary: 86 mg/dL (ref 65–99)
Glucose-Capillary: 166 mg/dL — ABNORMAL HIGH (ref 65–99)
Glucose-Capillary: 123 mg/dL — ABNORMAL HIGH (ref 65–99)

## 2015-10-21 SURGERY — ESOPHAGOGASTRODUODENOSCOPY (EGD) WITH PROPOFOL
Anesthesia: Monitor Anesthesia Care

## 2015-10-21 MED ORDER — PROMETHAZINE HCL 25 MG/ML IJ SOLN
6.2500 mg | INTRAMUSCULAR | Status: DC | PRN
Start: 2015-10-21 — End: 2015-10-22

## 2015-10-21 MED ORDER — PANTOPRAZOLE SODIUM 40 MG PO TBEC: 40.0000 mg | DELAYED_RELEASE_TABLET | Freq: Every day | ORAL | Status: DC

## 2015-10-21 MED ORDER — MIDAZOLAM HCL 5 MG/5ML IJ SOLN
INTRAMUSCULAR | Status: DC | PRN
  Administered 2015-10-21: 2 mg via INTRAVENOUS

## 2015-10-21 MED ORDER — BUTAMBEN-TETRACAINE-BENZOCAINE 2-2-14 % EX AERO
INHALATION_SPRAY | CUTANEOUS | Status: DC | PRN
  Administered 2015-10-21: 2 via TOPICAL

## 2015-10-21 MED ORDER — PROPOFOL 10 MG/ML IV BOLUS
INTRAVENOUS | Status: DC | PRN
  Administered 2015-10-21: 50 mg via INTRAVENOUS

## 2015-10-21 MED ORDER — ASPIRIN 81 MG PO CHEW
81.0000 mg | CHEWABLE_TABLET | ORAL | Status: AC
  Administered 2015-10-22: 81 mg via ORAL

## 2015-10-21 MED ORDER — PROPOFOL 500 MG/50ML IV EMUL
INTRAVENOUS | Status: DC | PRN
  Administered 2015-10-21: 100 ug/kg/min via INTRAVENOUS

## 2015-10-21 MED ORDER — PANTOPRAZOLE SODIUM 40 MG PO TBEC
40.0000 mg | DELAYED_RELEASE_TABLET | Freq: Every day | ORAL | Status: DC
  Administered 2015-10-21 – 2015-10-25 (×4): 40 mg via ORAL
  Filled 2015-10-21 (×5): qty 1

## 2015-10-21 NOTE — Consult Note (Signed)
Hospital Consult  VASCULAR SURGERY ASSESSMENT AND PLAN:  I agree with the note below. He has an extensive wound (7 cm in width by 17 cm in length) on the anterior aspect of his right leg. He has had this wound for over a year. I do not see any exposed bone. He has a palpable femoral, popliteal, and dorsalis pedis pulse on the right. He has a biphasic dorsalis pedis signal and a biphasic posterior tibial signal on the right. I think he likely has adequate circulation and that this is not the issue here. However I have ordered ABIs and toe pressures to be done today. If these were abnormal and consideration could be given to obtaining arteriogram although currently with palpable pulses I think it is unlikely that this would show significant disease. I have explained that given the extent of the wound which she's had for over a year that it would be best to have plastic surgery involved in his wound care. He may need to be considered for a skin graft or possible negative pressure dressing. He tells me that all these issues have RD been addressed at [facility]. Therefore it may be best for him to continue with management of this wound there as it sounds like they are already involved in these decisions. For now continue dressing changes with mild compression and I'll follow up on his ABIs and toe pressures.  Waverly Ferrari, MD, FACS Beeper [phone] Office: [phone]  Reason for Consult:  Non healing wound right leg Referring Physician:  Bensimhon  MRN #:  [id number]  History of Present Illness: This is a 54 y.o. male who was admitted yesterday for heart catheterization due to an abnormal myoview.   EF on echo a year ago 2015 was 25% with restrictive diastolic function with evidence for elevated LV filling pressure.  Normal RV size with mildly decreased systolic function.     His hgb was 7.5 and his procedure was cancelled.  He did receive 2 units of PRBC's.   His hgb this morning was up to 9.   GI  was consulted and he underwent an EGD this morning with findings of antral gastritis and erosions s/p random gastric bx's.  Recommendations include avoiding NSAIDs, but may continue aspirin.  PPI was started.  He was seen by Dr. Lemar Livings for right leg ulcer in October and also is followed by [facility] [place].  There records are not available for review.  He states that he bumped his leg a year ago Thanksgiving and has been having trouble healing this wound since then.  He did have right leg vein stripping in 1995.    Pt has been wearing an una boot on the RLE and has been changed weekly.  WOC was consulted this morning and wound evaluated.  It was found to have 2 full thickness wounds to the right calf with a large amout of green drainage, which was malodorous.  Pt has been started on Zosyn starting 10/20/15.    He does have a hx of diabetes and is on insulin and amaryl.  He is on a statin for cholesterol management.  He is on a beta blocker, but has not tolerated an ACEI in the past.  He takes a daily baby aspirin.  Past Medical History  Diagnosis Date  . Coronary artery disease 2012  . Diabetes mellitus 2003  . Hyperlipidemia   . Ischemic cardiomyopathy   . Chronic renal insufficiency   . Shingles 2003  . Proliferative  retinopathy 2015    treated with injection of Avastin.     Past Surgical History  Procedure Laterality Date  . Varicose vein surgery  1995    right lower extremity  . Coronary artery bypass graft  11/05/2010  . Appendectomy  1971  . Refractive surgery  2009, 2015  . Colonoscopy  10/2012    Dr Silver Huguenin in [place]. mall sigmoid tics, small internal hemorrhoids.  otherwise normal screening study.  no polyps, telangectasia...    No Known Allergies  Prior to Admission medications   Medication Sig Start Date End Date Taking? Authorizing Provider  Acetaminophen 500 MG coapsule Take 500 mg by mouth every 4 (four) hours as needed.     Yes Historical Provider,  MD  aspirin EC 81 MG tablet Take 1 tablet (81 mg total) by mouth daily. 03/10/15  Yes Laurey Morale, MD  cyclobenzaprine (FLEXERIL) 10 MG tablet Take 10 mg by mouth at bedtime.   Yes Historical Provider, MD  furosemide (LASIX) 20 MG tablet Take 20 mg tablet as needed for increased weight gain. Patient taking differently: Take 20 mg by mouth daily as needed for fluid or edema. Take 20 mg tablet as needed for increased weight gain. 03/28/14  Yes Aundria Rud, NP  glimepiride (AMARYL) 4 MG tablet Take 8 mg by mouth daily with breakfast. Take two tablets by mouth daily.   Yes Historical Provider, MD  metoprolol succinate (TOPROL-XL) 50 MG 24 hr tablet Take 75 mg (1.5 tabs) daily. 04/08/15  Yes Dolores Patty, MD  simvastatin (ZOCOR) 40 MG tablet Take 1 tablet (40 mg total) by mouth at bedtime. 03/28/14  Yes Aundria Rud, NP  spironolactone (ALDACTONE) 25 MG tablet Take 12.5 mg by mouth daily.   Yes Historical Provider, MD  traMADol (ULTRAM) 50 MG tablet Take 1 tablet (50 mg total) by mouth every 6 (six) hours as needed. Patient taking differently: Take 50 mg by mouth every 6 (six) hours as needed (pain).  08/25/15 08/24/16 Yes [name], MD    Social History   Social History  . Marital Status: Married    Spouse Name: N/A  . Number of Children: N/A  . Years of Education: N/A   Occupational History  . Not on file.   Social History Main Topics  . Smoking status: Never Smoker   . Smokeless tobacco: Never Used  . Alcohol Use: No  . Drug Use: No  . Sexual Activity: Not on file   Other Topics Concern  . Not on file   Social History Narrative     Family History  Problem Relation Age of Onset  . Hypertension Father     ROS:  Positive    Negative    All sytems reviewed and are negative  Cardiovascular:  chest pain/pressure  palpitations  CAD with hx of CABG  CHF  SOB lying flat  DOE  pain in legs while walking  pain in legs at rest   pain in legs at night  non-healing right leg  hx of DVT  swelling in legs  Pulmonary:  productive cough  asthma/wheezing  home O2  Neurologic:  weakness in  arms  legs  numbness in  arms  legs  hx of CVA  mini stroke difficulty speaking or slurred speech  temporary loss of vision in one eye  dizziness  Hematologic:  hx of cancer  bleeding problems  problems with blood clotting easily  Endocrine:     diabetes  thyroid disease  GI  vomiting blood  blood in stool  anemic with antral gastritis - received 2 PRBC's  GU:  CKD/renal failure  HD--[]  M/W/F or  T/T/S  burning with urination  blood in urine  Psychiatric:  anxiety  depression  Musculoskeletal:  arthritis  joint pain  Integumentary:  rashes  ulcers  Constitutional:  fever  chills   Physical Examination  Filed Vitals:   10/21/15 1110 10/21/15 1221  BP: 104/69 113/72  Pulse:  89  Temp:    Resp: 16    Body mass index is 22.69 kg/(m^2).  General:  WDWN in NAD Gait: Not observed HENT: WNL, normocephalic Pulmonary: normal non-labored breathing; crackles bilateral bases Cardiac: regular, without  Murmurs, rubs or gallops; without carotid bruits Abdomen:  soft, NT/ND, no masses Skin: without rashes Vascular Exam/Pulses:  Right Left  Radial 2+ (normal) 2+ (normal)  Femoral 2+ (normal) 2+ (normal)  Popliteal 2+ (normal) 2+ (normal)  DP 2+ (normal) biphasic 2+ (normal) biphasic  PT Unable to palpate biphasic Unable to palpate biphasic   Extremities:  Large medial right calf wound with copious purulent drainage measuring 14cm x 6cm x 3cm, which was full thickness; no bone exposed.  There is a smaller full thickness wound distal to large wound measuring ~ 2cmx2cm Musculoskeletal: no muscle wasting or atrophy  Neurologic: A&O X 3; Appropriate Affect ; SENSATION: normal; MOTOR FUNCTION:  moving all extremities equally.  Speech is fluent/normal   CBC    Component Value Date/Time   WBC 6.2 10/21/2015 0625   RBC 4.04* 10/21/2015 0625   RBC 4.08* 10/20/2015 1857   HGB 9.0* 10/21/2015 0625   HCT 32.4* 10/21/2015 0625   PLT 229 10/21/2015 0625   MCV 80.2 10/21/2015 0625   MCH 22.3* 10/21/2015 0625   MCHC 27.8* 10/21/2015 0625   RDW 19.6* 10/21/2015 0625   LYMPHSABS 1.6 10/21/2015 0625   MONOABS 0.5 10/21/2015 0625   EOSABS 0.1 10/21/2015 0625   BASOSABS 0.0 10/21/2015 0625    BMET    Component Value Date/Time   NA 138 10/21/2015 0625   K 4.6 10/21/2015 0625   CL 103 10/21/2015 0625   CO2 27 10/21/2015 0625   GLUCOSE 77 10/21/2015 0625   BUN 19 10/21/2015 0625   CREATININE 1.18 10/21/2015 0625   CALCIUM 8.8* 10/21/2015 0625   GFRNONAA >60 10/21/2015 0625   GFRAA >60 10/21/2015 0625    COAGS: Lab Results  Component Value Date   INR 1.29 10/20/2015     Non-Invasive Vascular Imaging:   ABI's pending-Dr. Edilia Bo has spoken to the vascular lab  Statin:  Yes.   Beta Blocker:  Yes.   Aspirin:  Yes.   ACEI:  No. ARB:  No. Other antiplatelets/anticoagulants:  No.    ASSESSMENT/PLAN: This is a 54 y.o. male with large non healing, full thickness wound on right medial calf with purulent drainage  -pt has had this wound for greater than one year and it has been treated with una boots and followed by [facility].   -he was scheduled to have a heart catheterization, however, he was found to be anemic and received PRBC's and got an EGD and was found to have gastritis.   -according to the pt, he is to have his heart catheterization tomorrow and he would prefer his ABI's be done today so that this does not interfere with the catheterization.  Dr. Edilia Bo has spoken to the vascular lab. -the pt does have biphasic doppler  signals in the DP/PT bilaterally and does have palpable DP pulses bilaterally.  He is most likely going to need a plastic surgery evaluation for this wound.  Wet to  dry dressing placed. -continue Abx and will check ABI/TBI results when done and make further recommendations.  Doreatha Massed, PA-C [facility] [phone]

## 2015-10-21 NOTE — Transfer of Care (Signed)
Immediate Anesthesia Transfer of Care Note  Patient: Vincent Brown  Procedure(s) Performed: Procedure(s): ESOPHAGOGASTRODUODENOSCOPY (EGD) WITH PROPOFOL (N/A)  Patient Location: PACU and Endoscopy Unit  Anesthesia Type:MAC  Level of Consciousness: awake, alert , oriented and patient cooperative  Airway & Oxygen Therapy: Patient Spontanous Breathing and Patient connected to nasal cannula oxygen  Post-op Assessment: Report given to RN, Post -op Vital signs reviewed and stable and Patient moving all extremities  Post vital signs: Reviewed and stable  Last Vitals:  Filed Vitals:   10/21/15 0844 10/21/15 1012  BP: 112/72 123/90  Pulse: 84 90  Temp: 36.8 C 36.9 C  Resp: 18 22    Complications: No apparent anesthesia complications

## 2015-10-21 NOTE — Op Note (Signed)
This is a synopsis of 10/30/12 Colonoscopy  By Dr Silver Huguenin Performed at [facility]  Findings: Small, internal hemorrhoids Sigmoid diverticulosis, few small-mouthed. Otherwise normal screening study.  Plan repeat screening study in 10 years.   Jennye Moccasin PA-C

## 2015-10-21 NOTE — Progress Notes (Signed)
VASCULAR LAB PRELIMINARY  ARTERIAL  ABI completed:  ABIs not accurate due to calcification.  Waveforms normal.  TBI and toe pressures within normal limits.    RIGHT    LEFT    PRESSURE WAVEFORM  PRESSURE WAVEFORM  BRACHIAL 120 Triphasic  BRACHIAL 118 Triphasic   DP >255 Triphasic  DP >255 Triphasic   AT   AT    PT 248 Triphasic  PT 156 Triphasic   PER   PER    GREAT TOE 115 NA GREAT TOE 109  NA    RIGHT LEFT  ABI Calcified Calcified   TBI 0.96 0.91     Braylen Staller, RVT 10/21/2015, 6:54 PM

## 2015-10-21 NOTE — Progress Notes (Signed)
Advanced Heart Failure Rounding Note   Subjective:    Denies complaints. Anxious to go home. Rec'd 2u RBCs yesterday. EGD today with chronic gastritis. PPI started.   RLE evaluated by wound care. Appears infected. Dressing changed. Abx started.    Objective:   Weight Range:  Vital Signs:   Temp:  [97.5 F (36.4 C)-99.3 F (37.4 C)] 97.8 F (36.6 C) (12/20 1051) Pulse Rate:  [84-100] 89 (12/20 1221) Resp:  [14-22] 16 (12/20 1110) BP: (101-123)/(58-90) 113/72 mmHg (12/20 1221) SpO2:  [95 %-100 %] 98 % (12/20 1110) Weight:  [73.755 kg (162 lb 9.6 oz)] 73.755 kg (162 lb 9.6 oz) (12/20 0604) Last BM Date: 10/20/15  Weight change: Filed Weights   10/20/15 1017 10/20/15 1116 10/21/15 0604  Weight: 74.118 kg (163 lb 6.4 oz) 73.936 kg (163 lb) 73.755 kg (162 lb 9.6 oz)    Intake/Output:   Intake/Output Summary (Last 24 hours) at 10/21/15 1224 Last data filed at 10/21/15 0843  Gross per 24 hour  Intake   1550 ml  Output   1600 ml  Net    -50 ml     Physical Exam: General: Well appearing. No resp difficulty HEENT: normal Neck: supple. JVP 6-7 . Carotids 2+ bilat; no bruits. No lymphadenopathy or thryomegaly appreciated. Cor: PMI nondisplaced. Regular rate & rhythm. No rubs, gallops or murmurs. Lungs: clear Abdomen: soft, nontender, nondistended. No hepatosplenomegaly. No bruits or masses. Good bowel sounds. Extremities: no cyanosis, clubbing, rash, RLE unna boot + odor Neuro: alert & orientedx3, cranial nerves grossly intact. moves all 4 extremities w/o difficulty. Affect pleasant  Telemetry: NSR  Labs: Basic Metabolic Panel:  Recent Labs Lab 10/20/15 0638 10/21/15 0625  NA 137 138  K 4.2 4.6  CL 103 103  CO2 27 27  GLUCOSE 78 77  BUN 20 19  CREATININE 0.99 1.18  CALCIUM 8.4* 8.8*    Liver Function Tests: No results for input(s): AST, ALT, ALKPHOS, BILITOT, PROT, ALBUMIN in the last 168 hours. No results for input(s): LIPASE, AMYLASE in the last  168 hours. No results for input(s): AMMONIA in the last 168 hours.  CBC:  Recent Labs Lab 10/20/15 0638 10/21/15 0625  WBC 6.7 6.2  NEUTROABS  --  4.0  HGB 7.5* 9.0*  HCT 28.5* 32.4*  MCV 78.7 80.2  PLT 232 229    Cardiac Enzymes: No results for input(s): CKTOTAL, CKMB, CKMBINDEX, TROPONINI in the last 168 hours.  BNP: BNP (last 3 results) No results for input(s): BNP in the last 8760 hours.  ProBNP (last 3 results) No results for input(s): PROBNP in the last 8760 hours.    Other results:  Imaging:  No results found.   Medications:     Scheduled Medications: . [START ON 10/22/2015] aspirin  81 mg Oral Pre-Cath  . aspirin EC  81 mg Oral Daily  . cyclobenzaprine  10 mg Oral QHS  . glimepiride  8 mg Oral Q breakfast  . insulin aspart  0-15 Units Subcutaneous TID WC  . metoprolol succinate  75 mg Oral Daily  . pantoprazole  40 mg Oral Daily  . piperacillin-tazobactam (ZOSYN)  IV  3.375 g Intravenous Q8H  . simvastatin  40 mg Oral QHS  . sodium chloride  3 mL Intravenous Q12H  . sodium chloride  3 mL Intravenous Q12H  . spironolactone  12.5 mg Oral Daily     Infusions:     PRN Medications:  acetaminophen, ondansetron (ZOFRAN) IV, sodium chloride, sodium chloride,  traMADol   Assessment:   1. Symptomatic Microcytic Anemia     --EGD 12/20 with gastritis  2. CAD with Abnormal Myoview- 11/21/201/6  s/p CABG LIMA - LAD, SVG-OM2, SVG-PDA (11/2010)  -Fixed medium-sized, severe basal to mid inferior and inferoseptal perfusion defect suggestive of infarction. Partially reversible medium-sized, moderate-intensity mid anterior and anteroseptal perfusion defect suggestive of infarction with significant peri-infarct ischemia. EF 21% with diffuse hypokinesis, worse inferiorly.  3. Chronic Systolic Heart Failure - ICM EF 20% 4. DMII 5. Hyperlipidemia  6. RLE wound, nonhealing   Plan/Discussion:    Hgb improved after transfusion. EGD today with gastritis.  PPI started  Will plan cath tomorrow to f/u on dyspnea and abnormal Myoview.   RLE wound appears infected. Continue dressings and abx. Will check MRI and ABIs (reportedly ABIs normal in past). Have asked [facility] to see.   Length of Stay: 1   Mickey Hebel  MD  10/21/2015, 12:24 PM  Advanced Heart Failure Team Pager [phone] (M-F; 7a - 4p)  Please contact [facility] for night-coverage after hours (4p -7a ) and weekends on [email or web address]

## 2015-10-21 NOTE — Op Note (Signed)
[facility] [address] [place], [address]   ENDOSCOPY PROCEDURE REPORT  PATIENT: Vincent Brown, Vincent Brown  MR#: [id number] BIRTHDATE: [birth year] , 54  yrs. old GENDER: male ENDOSCOPIST: Marsa Aris, MD REFERRED BY:  [facility] PROCEDURE DATE:  10/21/2015 PROCEDURE:  EGD, diagnostic and EGD w/ biopsy ASA CLASS:     Class IV INDICATIONS:  anemia and unexplained iron deficiency anemia. MEDICATIONS: Monitored anesthesia care TOPICAL ANESTHETIC: Cetacaine Spray  DESCRIPTION OF PROCEDURE: After the risks benefits and alternatives of the procedure were thoroughly explained, informed consent was obtained.  The Pentax Gastroscope [id number] endoscope was introduced through the mouth and advanced to the second portion of the duodenum , Without limitations.  The instrument was slowly withdrawn as the mucosa was fully examined.   Esophagus appeared normal. Regular Z line at 40 cm Patchy erythema with erosions in the gastric antrum, random biopsies were obtained from antrum and body to rule out H.  pylori associated gastritis. Duodenal bulb and second part of duodenum appeared normal. The scope was then withdrawn from the patient and the procedure completed.  COMPLICATIONS: There were no immediate complications.  ENDOSCOPIC IMPRESSION: Evidence of antral gastritis and erosions status post random gastric biopsies  RECOMMENDATIONS: follow-up pathology results Protonix 40 mg daily, 30 minutes before breakfast Avoid NSAID and can continue aspirin 81 mg oral iron replacement Follow-up in office visit in 2 months and we'll consider small bowel video capsule for further investigation if continues to be anemic     eSigned:  Marsa Aris, MD 10/21/2015 10:58 AM

## 2015-10-21 NOTE — Anesthesia Preprocedure Evaluation (Addendum)
Anesthesia Evaluation  Patient identified by MRN, date of birth, ID band Patient awake    Reviewed: Allergy & Precautions, NPO status , Patient's Chart, lab work & pertinent test results  Airway Mallampati: II  TM Distance: >3 FB Neck ROM: Full    Dental no notable dental hx.    Pulmonary neg pulmonary ROS,    Pulmonary exam normal breath sounds clear to auscultation       Cardiovascular + CABG and +CHF   Rhythm:Regular Rate:Normal + Systolic murmurs Mild to moderately dilated LV with EF 25%, diffuse hypokinesis. Restrictive diastolic function with evidence for elevated LV filling pressure. Normal RV size with mildly decreased systolic function. Mild MR. Mild pulmonary hypertension. Medium-sized, severe basal to mid inferior and inferoseptal perfusion defect. Medium-sized, mild mid anterior and anteroseptal perfusion defect.   Stress Perfusion Medium-sized, severe basal to mid inferior and inferoseptal perfusion defect. Medium-sized, moderate-intensity mid anterior and anteroseptal perfusion defect.  Perfusion Summary Fixed medium-sized, severe basal to mid inferior and inferoseptal perfusion defect. Partially reversible medium-sized, moderate-intensity mid anterior and anteroseptal perfusion defect.  Overall Study Impression Myocardial perfusion is abnormal. This is a high risk study. Overall left ventricular systolic function was abnormal. LV cavity size is moderately enlarged. Nuclear stress EF: 21%. The left ventricular ejection fraction is severely decreased (<30%). There is no prior study for comparison.        Neuro/Psych negative neurological ROS  negative psych ROS   GI/Hepatic negative GI ROS, Neg liver ROS,   Endo/Other  diabetes  Renal/GU Renal InsufficiencyRenal disease  negative genitourinary   Musculoskeletal negative musculoskeletal ROS (+)   Abdominal   Peds negative pediatric ROS (+)   Hematology  (+) anemia ,   Anesthesia Other Findings   Reproductive/Obstetrics negative OB ROS                            Anesthesia Physical Anesthesia Plan  ASA: IV  Anesthesia Plan: MAC   Post-op Pain Management:    Induction: Intravenous  Airway Management Planned: Nasal Cannula  Additional Equipment:   Intra-op Plan:   Post-operative Plan:   Informed Consent: I have reviewed the patients History and Physical, chart, labs and discussed the procedure including the risks, benefits and alternatives for the proposed anesthesia with the patient or authorized representative who has indicated his/her understanding and acceptance.   Dental advisory given  Plan Discussed with: CRNA and Surgeon  Anesthesia Plan Comments:         Anesthesia Quick Evaluation

## 2015-10-22 ENCOUNTER — Inpatient Hospital Stay (HOSPITAL_COMMUNITY): Payer: Managed Care, Other (non HMO)

## 2015-10-22 ENCOUNTER — Encounter (HOSPITAL_COMMUNITY)
Admission: AD | Disposition: A | Discharge status: Home Health | Payer: Managed Care, Other (non HMO) | Source: Ambulatory Visit | Attending: Internal Medicine

## 2015-10-22 ENCOUNTER — Encounter (HOSPITAL_COMMUNITY): Payer: Self-pay | Admitting: Gastroenterology

## 2015-10-22 DIAGNOSIS — I509 Heart failure, unspecified: Secondary | ICD-10-CM | POA: Insufficient documentation

## 2015-10-22 DIAGNOSIS — R9439 Abnormal result of other cardiovascular function study: Secondary | ICD-10-CM | POA: Insufficient documentation

## 2015-10-22 DIAGNOSIS — R0609 Other forms of dyspnea: Secondary | ICD-10-CM | POA: Insufficient documentation

## 2015-10-22 DIAGNOSIS — M86161 Other acute osteomyelitis, right tibia and fibula: Secondary | ICD-10-CM

## 2015-10-22 DIAGNOSIS — I25810 Atherosclerosis of coronary artery bypass graft(s) without angina pectoris: Secondary | ICD-10-CM

## 2015-10-22 HISTORY — PX: CARDIAC CATHETERIZATION: SHX172

## 2015-10-22 LAB — CBC WITH DIFFERENTIAL/PLATELET
BASOS ABS: 0 10*3/uL (ref 0.0–0.1)
BASOS PCT: 1 %
EOS PCT: 3 %
Eosinophils Absolute: 0.2 10*3/uL (ref 0.0–0.7)
HCT: 33 % — ABNORMAL LOW (ref 39.0–52.0)
Hemoglobin: 9.4 g/dL — ABNORMAL LOW (ref 13.0–17.0)
Lymphocytes Relative: 23 %
Lymphs Abs: 1.5 10*3/uL (ref 0.7–4.0)
MCH: 23 pg — ABNORMAL LOW (ref 26.0–34.0)
MCHC: 28.5 g/dL — ABNORMAL LOW (ref 30.0–36.0)
MCV: 80.7 fL (ref 78.0–100.0)
MONO ABS: 0.5 10*3/uL (ref 0.1–1.0)
Monocytes Relative: 7 %
Neutro Abs: 4.4 10*3/uL (ref 1.7–7.7)
Neutrophils Relative %: 66 %
PLATELETS: 210 10*3/uL (ref 150–400)
RBC: 4.09 MIL/uL — ABNORMAL LOW (ref 4.22–5.81)
RDW: 20 % — AB (ref 11.5–15.5)
WBC: 6.6 10*3/uL (ref 4.0–10.5)

## 2015-10-22 LAB — POCT I-STAT 3, VENOUS BLOOD GAS (G3P V)
ACID-BASE EXCESS: 1 mmol/L (ref 0.0–2.0)
ACID-BASE EXCESS: 2 mmol/L (ref 0.0–2.0)
BICARBONATE: 26.1 meq/L — AB (ref 20.0–24.0)
BICARBONATE: 27.1 meq/L — AB (ref 20.0–24.0)
O2 SAT: 57 %
O2 Saturation: 56 %
PH VEN: 7.409 — AB (ref 7.250–7.300)
PH VEN: 7.416 — AB (ref 7.250–7.300)
PO2 VEN: 30 mmHg (ref 30.0–45.0)
TCO2: 28 mmol/L (ref 0–100)
TCO2: 27 mmol/L (ref 0–100)
pCO2, Ven: 42.1 mmHg — ABNORMAL LOW (ref 45.0–50.0)
pCO2, Ven: 41.2 mmHg — ABNORMAL LOW (ref 45.0–50.0)
pO2, Ven: 29 mmHg — CL (ref 30.0–45.0)

## 2015-10-22 LAB — GLUCOSE, CAPILLARY
GLUCOSE-CAPILLARY: 120 mg/dL — AB (ref 65–99)
GLUCOSE-CAPILLARY: 187 mg/dL — AB (ref 65–99)
Glucose-Capillary: 104 mg/dL — ABNORMAL HIGH (ref 65–99)
Glucose-Capillary: 103 mg/dL — ABNORMAL HIGH (ref 65–99)
Glucose-Capillary: 90 mg/dL (ref 65–99)
Glucose-Capillary: 139 mg/dL — ABNORMAL HIGH (ref 65–99)

## 2015-10-22 LAB — POCT I-STAT 3, ART BLOOD GAS (G3+)
BICARBONATE: 24.5 meq/L — AB (ref 20.0–24.0)
O2 Saturation: 87 %
PO2 ART: 51 mmHg — AB (ref 80.0–100.0)
TCO2: 26 mmol/L (ref 0–100)
pCO2 arterial: 37.6 mmHg (ref 35.0–45.0)
pH, Arterial: 7.422 (ref 7.350–7.450)

## 2015-10-22 LAB — BASIC METABOLIC PANEL
ANION GAP: 5 (ref 5–15)
BUN: 19 mg/dL (ref 6–20)
CALCIUM: 8.8 mg/dL — AB (ref 8.9–10.3)
CO2: 28 mmol/L (ref 22–32)
Chloride: 104 mmol/L (ref 101–111)
Creatinine, Ser: 1.27 mg/dL — ABNORMAL HIGH (ref 0.61–1.24)
GFR calc Af Amer: >60 mL/min (ref >60)
GLUCOSE: 126 mg/dL — AB (ref 65–99)
Potassium: 5 mmol/L (ref 3.5–5.1)
Sodium: 137 mmol/L (ref 135–145)

## 2015-10-22 LAB — HEMOGLOBIN A1C
HEMOGLOBIN A1C: 6.3 % — AB (ref 4.8–5.6)
MEAN PLASMA GLUCOSE: 134 mg/dL

## 2015-10-22 SURGERY — RIGHT/LEFT HEART CATH AND CORONARY/GRAFT ANGIOGRAPHY
Anesthesia: LOCAL

## 2015-10-22 MED ORDER — SODIUM CHLORIDE 0.9 % IJ SOLN: 3.0000 mL | INTRAMUSCULAR | Status: DC | PRN

## 2015-10-22 MED ORDER — ONDANSETRON HCL 4 MG/2ML IJ SOLN: 4.0000 mg | Freq: Four times a day (QID) | INTRAMUSCULAR | Status: DC | PRN

## 2015-10-22 MED ORDER — HEPARIN (PORCINE) IN NACL 2-0.9 UNIT/ML-% IJ SOLN
INTRAMUSCULAR | Status: AC
  Filled 2015-10-22: qty 1500

## 2015-10-22 MED ORDER — SODIUM CHLORIDE 0.9 % IV SOLN: 250.0000 mL | INTRAVENOUS | Status: DC | PRN

## 2015-10-22 MED ORDER — SODIUM CHLORIDE 0.9 % IV SOLN: INTRAVENOUS | Status: DC

## 2015-10-22 MED ORDER — LIDOCAINE HCL (PF) 1 % IJ SOLN
INTRAMUSCULAR | Status: AC
Start: 2015-10-22 — End: 2015-10-22
  Filled 2015-10-22: qty 30

## 2015-10-22 MED ORDER — IOHEXOL 350 MG/ML SOLN
INTRAVENOUS | Status: DC | PRN
  Administered 2015-10-22: 80 mL via INTRA_ARTERIAL

## 2015-10-22 MED ORDER — LIDOCAINE HCL (PF) 1 % IJ SOLN
INTRAMUSCULAR | Status: DC | PRN
  Administered 2015-10-22: 09:00:00

## 2015-10-22 MED ORDER — ACETAMINOPHEN 325 MG PO TABS: 650.0000 mg | ORAL_TABLET | ORAL | Status: DC | PRN

## 2015-10-22 MED ORDER — SODIUM CHLORIDE 0.9 % IJ SOLN
3.0000 mL | Freq: Two times a day (BID) | INTRAMUSCULAR | Status: DC
  Administered 2015-10-22 – 2015-10-25 (×2): 3 mL via INTRAVENOUS

## 2015-10-22 MED ORDER — NITROGLYCERIN 1 MG/10 ML FOR IR/CATH LAB
INTRA_ARTERIAL | Status: AC
  Filled 2015-10-22: qty 10

## 2015-10-22 MED ORDER — FENTANYL CITRATE (PF) 100 MCG/2ML IJ SOLN
INTRAMUSCULAR | Status: AC
  Filled 2015-10-22: qty 2

## 2015-10-22 MED ORDER — MIDAZOLAM HCL 2 MG/2ML IJ SOLN
INTRAMUSCULAR | Status: AC
  Filled 2015-10-22: qty 2

## 2015-10-22 MED ORDER — SODIUM CHLORIDE 0.9 % IJ SOLN
3.0000 mL | Freq: Two times a day (BID) | INTRAMUSCULAR | Status: DC
  Administered 2015-10-22: 3 mL via INTRAVENOUS

## 2015-10-22 MED ORDER — MIDAZOLAM HCL 2 MG/2ML IJ SOLN
INTRAMUSCULAR | Status: DC | PRN
  Administered 2015-10-22 (×2): 1 mg via INTRAVENOUS

## 2015-10-22 MED ORDER — ENOXAPARIN SODIUM 40 MG/0.4ML ~~LOC~~ SOLN
40.0000 mg | SUBCUTANEOUS | Status: DC
  Administered 2015-10-25: 40 mg via SUBCUTANEOUS

## 2015-10-22 MED ORDER — FENTANYL CITRATE (PF) 100 MCG/2ML IJ SOLN
INTRAMUSCULAR | Status: DC | PRN
  Administered 2015-10-22 (×2): 25 ug via INTRAVENOUS

## 2015-10-22 MED ORDER — SODIUM CHLORIDE 0.9 % WEIGHT BASED INFUSION
1.0000 mL/kg/h | INTRAVENOUS | Status: DC
  Administered 2015-10-22: 1 mL/kg/h via INTRAVENOUS

## 2015-10-22 SURGICAL SUPPLY — 16 items
CATH EXPO 5F MPA-1 (CATHETERS) ×2 IMPLANT
CATH INFINITI 5 FR 3DRC (CATHETERS) ×2 IMPLANT
CATH INFINITI 5 FR AL2 (CATHETERS) ×2 IMPLANT
CATH INFINITI 5 FR IM (CATHETERS) ×2 IMPLANT
CATH INFINITI 5 FR LCB (CATHETERS) ×2 IMPLANT
CATH INFINITI 5FR AL1 (CATHETERS) ×2 IMPLANT
CATH INFINITI 5FR MULTPACK ANG (CATHETERS) ×2 IMPLANT
CATH SWAN GANZ 7F STRAIGHT (CATHETERS) ×2 IMPLANT
KIT HEART LEFT (KITS) ×2 IMPLANT
KIT HEART RIGHT NAMIC (KITS) ×2 IMPLANT
PACK CARDIAC CATHETERIZATION (CUSTOM PROCEDURE TRAY) ×2 IMPLANT
SHEATH PINNACLE 5F 10CM (SHEATH) ×2 IMPLANT
SHEATH PINNACLE 7F 10CM (SHEATH) ×2 IMPLANT
SYR MEDRAD MARK V 150ML (SYRINGE) ×2 IMPLANT
TRANSDUCER W/STOPCOCK (MISCELLANEOUS) ×2 IMPLANT
WIRE EMERALD 3MM-J .035X150CM (WIRE) ×2 IMPLANT

## 2015-10-22 NOTE — H&P (View-Only) (Signed)
  Advanced Heart Failure Rounding Note   Subjective:    Denies complaints. Anxious to go home. Rec'd 2u RBCs yesterday. EGD today with chronic gastritis. PPI started.   RLE evaluated by wound care. Appears infected. Dressing changed. Abx started.    Objective:   Weight Range:  Vital Signs:   Temp:  [97.5 F (36.4 C)-99.3 F (37.4 C)] 97.8 F (36.6 C) (12/20 1051) Pulse Rate:  [84-100] 89 (12/20 1221) Resp:  [14-22] 16 (12/20 1110) BP: (101-123)/(58-90) 113/72 mmHg (12/20 1221) SpO2:  [95 %-100 %] 98 % (12/20 1110) Weight:  [73.755 kg (162 lb 9.6 oz)] 73.755 kg (162 lb 9.6 oz) (12/20 0604) Last BM Date: 10/20/15  Weight change: Filed Weights   10/20/15 1017 10/20/15 1116 10/21/15 0604  Weight: 74.118 kg (163 lb 6.4 oz) 73.936 kg (163 lb) 73.755 kg (162 lb 9.6 oz)    Intake/Output:   Intake/Output Summary (Last 24 hours) at 10/21/15 1224 Last data filed at 10/21/15 0843  Gross per 24 hour  Intake   1550 ml  Output   1600 ml  Net    -50 ml     Physical Exam: General: Well appearing. No resp difficulty HEENT: normal Neck: supple. JVP 6-7 . Carotids 2+ bilat; no bruits. No lymphadenopathy or thryomegaly appreciated. Cor: PMI nondisplaced. Regular rate & rhythm. No rubs, gallops or murmurs. Lungs: clear Abdomen: soft, nontender, nondistended. No hepatosplenomegaly. No bruits or masses. Good bowel sounds. Extremities: no cyanosis, clubbing, rash, RLE unna boot + odor Neuro: alert & orientedx3, cranial nerves grossly intact. moves all 4 extremities w/o difficulty. Affect pleasant  Telemetry: NSR  Labs: Basic Metabolic Panel:  Recent Labs Lab 10/20/15 0638 10/21/15 0625  NA 137 138  K 4.2 4.6  CL 103 103  CO2 27 27  GLUCOSE 78 77  BUN 20 19  CREATININE 0.99 1.18  CALCIUM 8.4* 8.8*    Liver Function Tests: No results for input(s): AST, ALT, ALKPHOS, BILITOT, PROT, ALBUMIN in the last 168 hours. No results for input(s): LIPASE, AMYLASE in the last  168 hours. No results for input(s): AMMONIA in the last 168 hours.  CBC:  Recent Labs Lab 10/20/15 0638 10/21/15 0625  WBC 6.7 6.2  NEUTROABS  --  4.0  HGB 7.5* 9.0*  HCT 28.5* 32.4*  MCV 78.7 80.2  PLT 232 229    Cardiac Enzymes: No results for input(s): CKTOTAL, CKMB, CKMBINDEX, TROPONINI in the last 168 hours.  BNP: BNP (last 3 results) No results for input(s): BNP in the last 8760 hours.  ProBNP (last 3 results) No results for input(s): PROBNP in the last 8760 hours.    Other results:  Imaging:  No results found.   Medications:     Scheduled Medications: . [START ON 10/22/2015] aspirin  81 mg Oral Pre-Cath  . aspirin EC  81 mg Oral Daily  . cyclobenzaprine  10 mg Oral QHS  . glimepiride  8 mg Oral Q breakfast  . insulin aspart  0-15 Units Subcutaneous TID WC  . metoprolol succinate  75 mg Oral Daily  . pantoprazole  40 mg Oral Daily  . piperacillin-tazobactam (ZOSYN)  IV  3.375 g Intravenous Q8H  . simvastatin  40 mg Oral QHS  . sodium chloride  3 mL Intravenous Q12H  . sodium chloride  3 mL Intravenous Q12H  . spironolactone  12.5 mg Oral Daily     Infusions:     PRN Medications:  acetaminophen, ondansetron (ZOFRAN) IV, sodium chloride, sodium chloride,   traMADol   Assessment:   1. Symptomatic Microcytic Anemia     --EGD 12/20 with gastritis  2. CAD with Abnormal Myoview- 11/21/201/6  s/p CABG LIMA - LAD, SVG-OM2, SVG-PDA (11/2010)  -Fixed medium-sized, severe basal to mid inferior and inferoseptal perfusion defect suggestive of infarction. Partially reversible medium-sized, moderate-intensity mid anterior and anteroseptal perfusion defect suggestive of infarction with significant peri-infarct ischemia. EF 21% with diffuse hypokinesis, worse inferiorly.  3. Chronic Systolic Heart Failure - ICM EF 20% 4. DMII 5. Hyperlipidemia  6. RLE wound, nonhealing   Plan/Discussion:    Hgb improved after transfusion. EGD today with gastritis.  PPI started  Will plan cath tomorrow to f/u on dyspnea and abnormal Myoview.   RLE wound appears infected. Continue dressings and abx. Will check MRI and ABIs (reportedly ABIs normal in past). Have asked [facility] to see.   Length of Stay: 1   Bensimhon, Daniel  MD  10/21/2015, 12:24 PM  Advanced Heart Failure Team Pager [phone] (M-F; 7a - 4p)  Please contact [facility] for night-coverage after hours (4p -7a ) and weekends on [email or web address]

## 2015-10-22 NOTE — Consult Note (Addendum)
WOC wound follow-up consult note Reason for Consult: Initial consult performed on 12/19; refer to previous progress notes.  VVS team assessed wound yesterday and MRI indicates probable osteomyelitis. Requested to provide topical treatment orders for RLE. Pt is followed by a Vascular physician at [facility] and was previously wearing Una boots.  Wound type: 2 full thickness wounds to right calf. 2X1X.2cm and 14X6X.3cm which have greatly improved since  antibiotics were started. Both sites are 10% yellow, 90% red, without further green drainage or odor.  Currently, there is a mod amt yellow drainage and no odor. Few scattered areas of red raised growth of unknown etiology in wound bed. Periwound: Generalized hemosiderin staining surrounding wound; appearance consistent with venous stasis changes. Dressing procedure/placement/frequency: Applied Aquacel dressing to absorb drainage and provide antimicrobial benefits, then applied ace wrap for light compression.  Change dressings 3 times a week.  Recommend follow-up with home health assistance for dressing changes; please order if desired. Pt states he will follow-up with his physician at [facility] after discharge. Discussed plan of care with patient and his daughter at the bedside and they verbalize understanding. Please re-consult if further assistance is needed. Thank-you,  Cammie Mcgee MSN, RN, CWOCN, Joyce, CNS [phone]

## 2015-10-22 NOTE — Progress Notes (Signed)
   VASCULAR SURGERY ASSESSMENT & PLAN:  * I have reviewed his arterial Doppler study from yesterday. He has triphasic Doppler signals in the dorsalis pedis and posterior tibial positions bilaterally. The arteries are calcified and ABIs cannot be obtained. However toe pressure on the right is 115 mmHg and toe pressure on the left is 109 mmHg. Based on these findings, and a palpable dorsalis pedis pulse on the right, he should have adequate circulation to heal the wound on his right leg. However this wound is quite extensive and he has had this for over a year. We have discussed plastic surgery consultation and this is already in progress at [facility]. He would prefer to continue his follow up of the wound there.  *  Vascular surgery will be available as needed.  SUBJECTIVE: No complaints.  PHYSICAL EXAM: Filed Vitals:   10/21/15 1110 10/21/15 1221 10/21/15 2218 10/22/15 0421  BP: 104/69 113/72 117/72 100/62  Pulse:  89 94 89  Temp:   98.1 F (36.7 C) 98.2 F (36.8 C)  TempSrc:   Oral Oral  Resp: 16  16 14   Height:      Weight:    161 lb 11.2 oz (73.347 kg)  SpO2: 98%  100% 95%   Moderate drainage on dressing. Palpable dorsalis pedis pulse.  LABS: Lab Results  Component Value Date   WBC 6.2 10/21/2015   HGB 9.0* 10/21/2015   HCT 32.4* 10/21/2015   MCV 80.2 10/21/2015   PLT 229 10/21/2015   Lab Results  Component Value Date   CREATININE 1.18 10/21/2015   Lab Results  Component Value Date   INR 1.29 10/20/2015   CBG (last 3)   Recent Labs  10/21/15 1239 10/21/15 1716 10/21/15 2222  GLUCAP 67 166* 123*    Active Problems:   Anemia  Cari Caraway Beeper: [phone] 10/22/2015

## 2015-10-22 NOTE — Progress Notes (Signed)
  Echocardiogram 2D Echocardiogram has been performed.  Janalyn Harder 10/22/2015, 3:48 PM

## 2015-10-22 NOTE — Interval H&P Note (Signed)
History and Physical Interval Note:  10/22/2015 9:02 AM  Vincent Brown  has presented today for surgery, with the diagnosis of chf  The various methods of treatment have been discussed with the patient and family. After consideration of risks, benefits and other options for treatment, the patient has consented to  Procedure(s): Right/Left Heart Cath and Coronary/Graft Angiography (N/A) and possible angioplasty as a surgical intervention .  The patient's history has been reviewed, patient examined, no change in status, stable for surgery.  I have reviewed the patient's chart and labs.  Questions were answered to the patient's satisfaction.     Lujuana Kapler, Reuel Boom

## 2015-10-22 NOTE — Progress Notes (Signed)
Pt arrived to the unit  R groin Level 1

## 2015-10-22 NOTE — Progress Notes (Signed)
Advanced Heart Failure Rounding Note   Subjective:    S/p R/LHC this AM with intact revascularization.  1 of 2 SVG patent, LIMA to LAD patent.   MRI of right leg performed yesterday suggestive of osteo.   Feels ok. No CP or SOB. Wants to go home.    Objective:   Weight Range:  Vital Signs:   Temp:  [97.8 F (36.6 C)-98.5 F (36.9 C)] 98.2 F (36.8 C) (12/21 0421) Pulse Rate:  [84-94] 89 (12/21 0421) Resp:  [14-22] 14 (12/21 0421) BP: (100-123)/(58-90) 100/62 mmHg (12/21 0421) SpO2:  [95 %-100 %] 95 % (12/21 0421) Weight:  [161 lb 11.2 oz (73.347 kg)] 161 lb 11.2 oz (73.347 kg) (12/21 0421) Last BM Date: 10/20/15  Weight change: Filed Weights   10/20/15 1116 10/21/15 0604 10/22/15 0421  Weight: 163 lb (73.936 kg) 162 lb 9.6 oz (73.755 kg) 161 lb 11.2 oz (73.347 kg)    Intake/Output:   Intake/Output Summary (Last 24 hours) at 10/22/15 0713 Last data filed at 10/22/15 3570  Gross per 24 hour  Intake   1032 ml  Output   1800 ml  Net   -768 ml     Physical Exam: General: Well appearing. No resp difficulty HEENT: normal Neck: supple. JVP 8-9 . Carotids 2+ bilat; no bruits. No lymphadenopathy or thryomegaly appreciated. Cor: PMI nondisplaced. Regular rate & rhythm. No rubs, gallops or murmurs. Lungs: clear Abdomen: soft, nontender, nondistended. No hepatosplenomegaly. No bruits or masses. Good bowel sounds. Extremities: no cyanosis, clubbing, rash, RLE unna boot + odor Neuro: alert & oriented x3, cranial nerves grossly intact. moves all 4 extremities w/o difficulty. Affect pleasant  Telemetry: NSR  Labs: Basic Metabolic Panel:  Recent Labs Lab 10/20/15 0638 10/21/15 0625  NA 137 138  K 4.2 4.6  CL 103 103  CO2 27 27  GLUCOSE 78 77  BUN 20 19  CREATININE 0.99 1.18  CALCIUM 8.4* 8.8*   CBC:  Recent Labs Lab 10/20/15 0638 10/21/15 0625  WBC 6.7 6.2  NEUTROABS  --  4.0  HGB 7.5* 9.0*  HCT 28.5* 32.4*  MCV 78.7 80.2  PLT 232 229      Other results:  Imaging: Mr Tibia Fibula Right Wo Contrast  10/21/2015  CLINICAL DATA:  Chronic open wound on the anterior aspect of the right lower extremity. EXAM: MRI OF LOWER RIGHT EXTREMITY WITHOUT CONTRAST TECHNIQUE: Multiplanar, multisequence MR imaging of the right lower extremity was performed. No intravenous contrast was administered. COMPARISON:  None. FINDINGS: There is a long enlarge area of soft tissue defect involving the anterior aspect of the right she and along the anterior and medial aspect of the right tibia. Increased T2 signal intensity could be due to cellulitis. No discrete drainable abscess is identified. Mild myositis surrounding the tibia but no findings for pyomyositis. Signal abnormality in the mid tibial shaft is worrisome for osteomyelitis. IMPRESSION: Chronic open wound involving the anterior aspect of the right lower extremity with probable granulation tissue and cellulitis. No discrete drainable abscess. Edema like signal abnormality in the right mid tibia is worrisome for osteomyelitis. Electronically Signed   By: Rudie Meyer M.D.   On: 10/21/2015 22:26     Medications:     Scheduled Medications: . aspirin EC  81 mg Oral Daily  . cyclobenzaprine  10 mg Oral QHS  . glimepiride  8 mg Oral Q breakfast  . insulin aspart  0-15 Units Subcutaneous TID WC  . metoprolol succinate  75  mg Oral Daily  . pantoprazole  40 mg Oral Daily  . piperacillin-tazobactam (ZOSYN)  IV  3.375 g Intravenous Q8H  . simvastatin  40 mg Oral QHS  . sodium chloride  3 mL Intravenous Q12H  . sodium chloride  3 mL Intravenous Q12H  . sodium chloride  3 mL Intravenous Q12H  . spironolactone  12.5 mg Oral Daily    Infusions: . [START ON 10/23/2015] sodium chloride      PRN Medications: sodium chloride, acetaminophen, ondansetron (ZOFRAN) IV, promethazine, sodium chloride, sodium chloride, sodium chloride, traMADol   Assessment:   1. Symptomatic Microcytic Anemia      --EGD 12/20 with gastritis  2. CAD with Abnormal Myoview- 11/21/201/6  s/p CABG LIMA - LAD, SVG-OM2, SVG-PDA (11/2010)     --Cardiac  3. Chronic Systolic Heart Failure - ICM EF 20% 4. DMII 5. Hyperlipidemia  6. RLE wound, nonhealing   Plan/Discussion:    Hgb improved after transfusion. EGD 12/20 with gastritis. PPI started  Cath with intact revascularization. Mildly elevated filling pressures. Can diurese gently. Cardiac output ok.   RLE wound appears to have osteo. Continue dressings and abx. Continue Zosyn for now. VVS feels AKA the best option but Mr. Robarts ahs been hesitant about this. Will await formal recommendations.   Length of Stay: 2   Bensimhon, Daniel,MD 3:23 PM   Advanced Heart Failure Team Pager [phone] (M-F; 7a - 4p)  Please contact [facility] for night-coverage after hours (4p -7a ) and weekends on [email or web address]

## 2015-10-22 NOTE — Consult Note (Signed)
Asked by Dr Edilia Bo to review findings with pt of osteo on MRI.  Pt with chronic wound.  We have some concerns about healing potential but pt states wound has been improving.   Dr Edilia Bo will see pt and give options again tomorrow morning  Fabienne Bruns, MD [facility] Office: [phone] Pager: [phone]

## 2015-10-23 ENCOUNTER — Encounter (HOSPITAL_COMMUNITY): Payer: Self-pay | Admitting: Plastic Surgery

## 2015-10-23 ENCOUNTER — Other Ambulatory Visit: Payer: Self-pay | Admitting: Plastic Surgery

## 2015-10-23 DIAGNOSIS — L089 Local infection of the skin and subcutaneous tissue, unspecified: Secondary | ICD-10-CM | POA: Insufficient documentation

## 2015-10-23 DIAGNOSIS — L97913 Non-pressure 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 lower leg with necrosis of muscle: Secondary | ICD-10-CM

## 2015-10-23 DIAGNOSIS — T148XXA Other injury of unspecified body region, initial encounter: Secondary | ICD-10-CM

## 2015-10-23 DIAGNOSIS — T148 Other injury of unspecified body region: Secondary | ICD-10-CM

## 2015-10-23 LAB — GLUCOSE, CAPILLARY
GLUCOSE-CAPILLARY: 140 mg/dL — AB (ref 65–99)
Glucose-Capillary: 174 mg/dL — ABNORMAL HIGH (ref 65–99)
Glucose-Capillary: 68 mg/dL (ref 65–99)
Glucose-Capillary: 97 mg/dL (ref 65–99)

## 2015-10-23 LAB — CBC
HEMATOCRIT: 33.1 % — AB (ref 39.0–52.0)
Hemoglobin: 9.4 g/dL — ABNORMAL LOW (ref 13.0–17.0)
MCH: 22.8 pg — AB (ref 26.0–34.0)
MCHC: 28.4 g/dL — AB (ref 30.0–36.0)
MCV: 80.3 fL (ref 78.0–100.0)
Platelets: 205 10*3/uL (ref 150–400)
RBC: 4.12 MIL/uL — ABNORMAL LOW (ref 4.22–5.81)
RDW: 20.3 % — AB (ref 11.5–15.5)
WBC: 6.3 10*3/uL (ref 4.0–10.5)

## 2015-10-23 LAB — BASIC METABOLIC PANEL
ANION GAP: 7 (ref 5–15)
BUN: 18 mg/dL (ref 6–20)
CHLORIDE: 104 mmol/L (ref 101–111)
CO2: 25 mmol/L (ref 22–32)
Calcium: 8.7 mg/dL — ABNORMAL LOW (ref 8.9–10.3)
Creatinine, Ser: 1.19 mg/dL (ref 0.61–1.24)
GFR calc non Af Amer: >60 mL/min (ref >60)
GLUCOSE: 146 mg/dL — AB (ref 65–99)
POTASSIUM: 4.7 mmol/L (ref 3.5–5.1)
SODIUM: 136 mmol/L (ref 135–145)

## 2015-10-23 MED ORDER — PANTOPRAZOLE SODIUM 40 MG PO TBEC: 40.0000 mg | DELAYED_RELEASE_TABLET | Freq: Every day | ORAL | Status: DC

## 2015-10-23 MED ORDER — CIPROFLOXACIN HCL 750 MG PO TABS: 750.0000 mg | ORAL_TABLET | Freq: Two times a day (BID) | ORAL | Status: DC

## 2015-10-23 MED ORDER — PIPERACILLIN-TAZOBACTAM 3.375 G IVPB
3.3750 g | Freq: Three times a day (TID) | INTRAVENOUS | Status: DC
  Administered 2015-10-23 – 2015-10-25 (×6): 3.375 g via INTRAVENOUS
  Filled 2015-10-23 (×7): qty 50

## 2015-10-23 MED ORDER — CEFAZOLIN SODIUM-DEXTROSE 2-3 GM-% IV SOLR
2.0000 g | INTRAVENOUS | Status: AC
  Administered 2015-10-24: 2 g via INTRAVENOUS
  Filled 2015-10-23 (×2): qty 50

## 2015-10-23 MED FILL — Heparin Sodium (Porcine) 2 Unit/ML in Sodium Chloride 0.9%: INTRAMUSCULAR | Qty: 500 | Status: AC

## 2015-10-23 MED FILL — Nitroglycerin IV Soln 100 MCG/ML in D5W: INTRA_ARTERIAL | Qty: 10 | Status: AC

## 2015-10-23 NOTE — Consult Note (Signed)
Reason for Consult: right leg wound Referring Physician: Dr. Deitra Mayo  Vincent Brown is an 54 y.o. male.  HPI: The patient is a 54 yrs old wm here with his wife for treatment of his right leg.  He injured his leg over a year ago with a log chain.  He has a farm and land.  He was cleaning up the logs when he got injured on the leg.  He has been using hydrogen peroxide until 2 months ago.  He started to see a vascular surgeon in [place] and was being treated with a UNNA boot.  The area is tender and there is muscle exposed and poor flow to the surrounding skin.  There is a pedal pulse.  The films were reviewed and concerning for deep infection.  Past Medical History  Diagnosis Date  . Coronary artery disease 2012  . Diabetes mellitus 2003  . Hyperlipidemia   . Ischemic cardiomyopathy   . Chronic renal insufficiency   . Shingles 2003  . Proliferative retinopathy 2015    treated with injection of Avastin.     Past Surgical History  Procedure Laterality Date  . Varicose vein surgery  1995    right lower extremity  . Coronary artery bypass graft  11/05/2010  . Appendectomy  1971  . Refractive surgery  2009, 2015  . Colonoscopy  10/2012    Dr April Manson in [place]. mall sigmoid tics, small internal hemorrhoids.  otherwise normal screening study.  no polyps, telangectasia...  . Esophagogastroduodenoscopy (egd) with propofol N/A 10/21/2015    Procedure: ESOPHAGOGASTRODUODENOSCOPY (EGD) WITH PROPOFOL;  Surgeon: Mauri Pole, MD;  Location: [facility];  Service: Endoscopy;  Laterality: N/A;  . Cardiac catheterization N/A 10/22/2015    Procedure: Right/Left Heart Cath and Coronary/Graft Angiography;  Surgeon: Jolaine Artist, MD;  Location: [facility];  Service: Cardiovascular;  Laterality: N/A;    Family History  Problem Relation Age of Onset  . Hypertension Father     Social History:  reports that he has never smoked. He has never used smokeless tobacco. He  reports that he does not drink alcohol or use illicit drugs.  Allergies: No Known Allergies  Medications: I have reviewed the patient's current medications.  Results for orders placed or performed during the hospital encounter of 10/20/15 (from the past 48 hour(s))  Glucose, capillary     Status: Abnormal   Collection Time: 10/21/15  5:16 PM  Result Value Ref Range   Glucose-Capillary 166 (H) 65 - 99 mg/dL  Glucose, capillary     Status: Abnormal   Collection Time: 10/21/15 10:22 PM  Result Value Ref Range   Glucose-Capillary 123 (H) 65 - 99 mg/dL  Glucose, capillary     Status: Abnormal   Collection Time: 10/22/15  6:13 AM  Result Value Ref Range   Glucose-Capillary 120 (H) 65 - 99 mg/dL  Basic metabolic panel     Status: Abnormal   Collection Time: 10/22/15  6:25 AM  Result Value Ref Range   Sodium 137 135 - 145 mmol/L   Potassium 5.0 3.5 - 5.1 mmol/L   Chloride 104 101 - 111 mmol/L   CO2 28 22 - 32 mmol/L   Glucose, Bld 126 (H) 65 - 99 mg/dL   BUN 19 6 - 20 mg/dL   Creatinine, Ser 1.27 (H) 0.61 - 1.24 mg/dL   Calcium 8.8 (L) 8.9 - 10.3 mg/dL   GFR calc non Af Amer >60 >60 mL/min   GFR  calc Af Amer >60 >60 mL/min    Comment: (NOTE) The eGFR has been calculated using the CKD EPI equation. This calculation has not been validated in all clinical situations. eGFR's persistently <60 mL/min signify possible Chronic Kidney Disease.    Anion gap 5 5 - 15  CBC with Differential/Platelet     Status: Abnormal   Collection Time: 10/22/15  6:25 AM  Result Value Ref Range   WBC 6.6 4.0 - 10.5 K/uL   RBC 4.09 (L) 4.22 - 5.81 MIL/uL   Hemoglobin 9.4 (L) 13.0 - 17.0 g/dL   HCT 33.0 (L) 39.0 - 52.0 %   MCV 80.7 78.0 - 100.0 fL   MCH 23.0 (L) 26.0 - 34.0 pg   MCHC 28.5 (L) 30.0 - 36.0 g/dL   RDW 20.0 (H) 11.5 - 15.5 %   Platelets 210 150 - 400 K/uL   Neutrophils Relative % 66 %   Neutro Abs 4.4 1.7 - 7.7 K/uL   Lymphocytes Relative 23 %   Lymphs Abs 1.5 0.7 - 4.0 K/uL    Monocytes Relative 7 %   Monocytes Absolute 0.5 0.1 - 1.0 K/uL   Eosinophils Relative 3 %   Eosinophils Absolute 0.2 0.0 - 0.7 K/uL   Basophils Relative 1 %   Basophils Absolute 0.0 0.0 - 0.1 K/uL  Glucose, capillary     Status: Abnormal   Collection Time: 10/22/15  8:14 AM  Result Value Ref Range   Glucose-Capillary 104 (H) 65 - 99 mg/dL  I-STAT 3, arterial blood gas (G3+)     Status: Abnormal   Collection Time: 10/22/15  9:16 AM  Result Value Ref Range   pH, Arterial 7.422 7.350 - 7.450   pCO2 arterial 37.6 35.0 - 45.0 mmHg   pO2, Arterial 51.0 (L) 80.0 - 100.0 mmHg   Bicarbonate 24.5 (H) 20.0 - 24.0 mEq/L   TCO2 26 0 - 100 mmol/L   O2 Saturation 87.0 %   Patient temperature HIDE    Sample type ARTERIAL   I-STAT 3, venous blood gas (G3P V)     Status: Abnormal   Collection Time: 10/22/15  9:22 AM  Result Value Ref Range   pH, Ven 7.416 (H) 7.250 - 7.300   pCO2, Ven 42.1 (L) 45.0 - 50.0 mmHg   pO2, Ven 30.0 30.0 - 45.0 mmHg   Bicarbonate 27.1 (H) 20.0 - 24.0 mEq/L   TCO2 28 0 - 100 mmol/L   O2 Saturation 57.0 %   Acid-Base Excess 2.0 0.0 - 2.0 mmol/L   Patient temperature HIDE    Sample type VENOUS    Comment NOTIFIED PHYSICIAN   I-STAT 3, venous blood gas (G3P V)     Status: Abnormal   Collection Time: 10/22/15  9:23 AM  Result Value Ref Range   pH, Ven 7.409 (H) 7.250 - 7.300   pCO2, Ven 41.2 (L) 45.0 - 50.0 mmHg   pO2, Ven 29.0 (LL) 30.0 - 45.0 mmHg   Bicarbonate 26.1 (H) 20.0 - 24.0 mEq/L   TCO2 27 0 - 100 mmol/L   O2 Saturation 56.0 %   Acid-Base Excess 1.0 0.0 - 2.0 mmol/L   Patient temperature HIDE    Sample type VENOUS    Comment NOTIFIED PHYSICIAN   Glucose, capillary     Status: Abnormal   Collection Time: 10/22/15 10:26 AM  Result Value Ref Range   Glucose-Capillary 103 (H) 65 - 99 mg/dL  Glucose, capillary     Status: None   Collection Time:  10/22/15 11:36 AM  Result Value Ref Range   Glucose-Capillary 90 65 - 99 mg/dL  Glucose, capillary      Status: Abnormal   Collection Time: 10/22/15  4:34 PM  Result Value Ref Range   Glucose-Capillary 139 (H) 65 - 99 mg/dL   Comment 1 Notify RN   Glucose, capillary     Status: Abnormal   Collection Time: 10/22/15 10:09 PM  Result Value Ref Range   Glucose-Capillary 187 (H) 65 - 99 mg/dL   Comment 1 Notify RN    Comment 2 Document in Chart   Basic metabolic panel     Status: Abnormal   Collection Time: 10/23/15  3:48 AM  Result Value Ref Range   Sodium 136 135 - 145 mmol/L   Potassium 4.7 3.5 - 5.1 mmol/L   Chloride 104 101 - 111 mmol/L   CO2 25 22 - 32 mmol/L   Glucose, Bld 146 (H) 65 - 99 mg/dL   BUN 18 6 - 20 mg/dL   Creatinine, Ser 1.19 0.61 - 1.24 mg/dL   Calcium 8.7 (L) 8.9 - 10.3 mg/dL   GFR calc non Af Amer >60 >60 mL/min   GFR calc Af Amer >60 >60 mL/min    Comment: (NOTE) The eGFR has been calculated using the CKD EPI equation. This calculation has not been validated in all clinical situations. eGFR's persistently <60 mL/min signify possible Chronic Kidney Disease.    Anion gap 7 5 - 15  CBC     Status: Abnormal   Collection Time: 10/23/15  3:48 AM  Result Value Ref Range   WBC 6.3 4.0 - 10.5 K/uL   RBC 4.12 (L) 4.22 - 5.81 MIL/uL   Hemoglobin 9.4 (L) 13.0 - 17.0 g/dL   HCT 33.1 (L) 39.0 - 52.0 %   MCV 80.3 78.0 - 100.0 fL   MCH 22.8 (L) 26.0 - 34.0 pg   MCHC 28.4 (L) 30.0 - 36.0 g/dL   RDW 20.3 (H) 11.5 - 15.5 %   Platelets 205 150 - 400 K/uL  Glucose, capillary     Status: Abnormal   Collection Time: 10/23/15  5:21 AM  Result Value Ref Range   Glucose-Capillary 140 (H) 65 - 99 mg/dL   Comment 1 Notify RN    Comment 2 Document in Chart   Glucose, capillary     Status: Abnormal   Collection Time: 10/23/15 12:07 PM  Result Value Ref Range   Glucose-Capillary 174 (H) 65 - 99 mg/dL    Mr Tibia Fibula Right Wo Contrast  10/21/2015  CLINICAL DATA:  Chronic open wound on the anterior aspect of the right lower extremity. EXAM: MRI OF LOWER RIGHT EXTREMITY  WITHOUT CONTRAST TECHNIQUE: Multiplanar, multisequence MR imaging of the right lower extremity was performed. No intravenous contrast was administered. COMPARISON:  None. FINDINGS: There is a long enlarge area of soft tissue defect involving the anterior aspect of the right she and along the anterior and medial aspect of the right tibia. Increased T2 signal intensity could be due to cellulitis. No discrete drainable abscess is identified. Mild myositis surrounding the tibia but no findings for pyomyositis. Signal abnormality in the mid tibial shaft is worrisome for osteomyelitis. IMPRESSION: Chronic open wound involving the anterior aspect of the right lower extremity with probable granulation tissue and cellulitis. No discrete drainable abscess. Edema like signal abnormality in the right mid tibia is worrisome for osteomyelitis. Electronically Signed   By: Marijo Sanes M.D.   On: 10/21/2015 22:26  Review of Systems  HENT: Negative.   Eyes: Negative.   Respiratory: Negative.   Cardiovascular: Negative.   Gastrointestinal: Negative.   Genitourinary: Negative.   Musculoskeletal: Negative.   Skin: Negative.   Neurological: Positive for weakness.  Psychiatric/Behavioral: Negative.    Blood pressure 123/79, pulse 99, temperature 97.6 F (36.4 C), temperature source Oral, resp. rate 14, height '5\' 11"'  (1.803 m), weight 70.897 kg (156 lb 4.8 oz), SpO2 100 %. Physical Exam  Constitutional: He is oriented to person, place, and time. He appears well-developed.  HENT:  Head: Normocephalic and atraumatic.  Eyes: Conjunctivae and EOM are normal. Pupils are equal, round, and reactive to light.  Respiratory: Effort normal.  Musculoskeletal:       Legs: Neurological: He is alert and oriented to person, place, and time.  Psychiatric: He has a normal mood and affect. His behavior is normal.    Assessment/Plan: Recommend debridement with possible VAC and Acell placement.  He needs protein drinks, a  prealbumin checked, multivitamin daily, Vit C 500 mg twice a day, Zinc 220 mg daily.  I expressed my concern about the leg and the need for surgical debridement.  He seems to be in denial about the severity of the situation.  Wallace Going 10/23/2015, 12:45 PM

## 2015-10-23 NOTE — Progress Notes (Addendum)
Advanced Heart Failure Rounding Note   Subjective:    Patient upset he is still in the hospital, ready to leave. Not willing to go through with AKA at this time, awaiting discussion with plastic surgery.   ECHO performed yesterday evening shows EF of 15-20%, PAP 35 mm Hg.   Objective:   Weight Range:  Vital Signs:   Temp:  [97.7 F (36.5 C)-98.5 F (36.9 C)] 98.5 F (36.9 C) (12/22 0525) Pulse Rate:  [80-125] 96 (12/22 0525) Resp:  [7-19] 14 (12/22 0525) BP: (104-127)/(65-85) 114/73 mmHg (12/22 0525) SpO2:  [91 %-100 %] 95 % (12/22 0525) Weight:  [156 lb 4.8 oz (70.897 kg)] 156 lb 4.8 oz (70.897 kg) (12/22 0525) Last BM Date: 10/21/15  Weight change: Filed Weights   10/21/15 0604 10/22/15 0421 10/23/15 0525  Weight: 162 lb 9.6 oz (73.755 kg) 161 lb 11.2 oz (73.347 kg) 156 lb 4.8 oz (70.897 kg)    Intake/Output:   Intake/Output Summary (Last 24 hours) at 10/23/15 0736 Last data filed at 10/23/15 0528  Gross per 24 hour  Intake    870 ml  Output      0 ml  Net    870 ml     Physical Exam: General: Well appearing. No resp difficulty HEENT: normal Neck: supple. JVP 7-8. Carotids 2+ bilat; no bruits. No lymphadenopathy or thryomegaly appreciated. Cor: PMI nondisplaced. Regular rate & rhythm. No rubs, gallops or murmurs. Lungs: clear Abdomen: soft, nontender, nondistended. No hepatosplenomegaly. No bruits or masses. Good bowel sounds. Extremities: no cyanosis, clubbing, rash, RLE unna boot + odor Neuro: alert & oriented x3, cranial nerves grossly intact. moves all 4 extremities w/o difficulty. Affect pleasant  Telemetry: NSR  Labs: Basic Metabolic Panel:  Recent Labs Lab 10/20/15 0638 10/21/15 0625 10/22/15 0625 10/23/15 0348  NA 137 138 137 136  K 4.2 4.6 5.0 4.7  CL 103 103 104 104  CO2 27 27 28 25   GLUCOSE 78 77 126* 146*  BUN 20 19 19 18   CREATININE 0.99 1.18 1.27* 1.19  CALCIUM 8.4* 8.8* 8.8* 8.7*   CBC:  Recent Labs Lab 10/20/15 0638  10/21/15 0625 10/22/15 0625 10/23/15 0348  WBC 6.7 6.2 6.6 6.3  NEUTROABS  --  4.0 4.4  --   HGB 7.5* 9.0* 9.4* 9.4*  HCT 28.5* 32.4* 33.0* 33.1*  MCV 78.7 80.2 80.7 80.3  PLT 232 229 210 205     Other results:  Imaging: Mr Tibia Fibula Right Wo Contrast  10/21/2015  CLINICAL DATA:  Chronic open wound on the anterior aspect of the right lower extremity. EXAM: MRI OF LOWER RIGHT EXTREMITY WITHOUT CONTRAST TECHNIQUE: Multiplanar, multisequence MR imaging of the right lower extremity was performed. No intravenous contrast was administered. COMPARISON:  None. FINDINGS: There is a long enlarge area of soft tissue defect involving the anterior aspect of the right she and along the anterior and medial aspect of the right tibia. Increased T2 signal intensity could be due to cellulitis. No discrete drainable abscess is identified. Mild myositis surrounding the tibia but no findings for pyomyositis. Signal abnormality in the mid tibial shaft is worrisome for osteomyelitis. IMPRESSION: Chronic open wound involving the anterior aspect of the right lower extremity with probable granulation tissue and cellulitis. No discrete drainable abscess. Edema like signal abnormality in the right mid tibia is worrisome for osteomyelitis. Electronically Signed   By: Rudie Meyer M.D.   On: 10/21/2015 22:26     Medications:  Scheduled Medications: . aspirin EC  81 mg Oral Daily  . cyclobenzaprine  10 mg Oral QHS  . enoxaparin (LOVENOX) injection  40 mg Subcutaneous Q24H  . glimepiride  8 mg Oral Q breakfast  . insulin aspart  0-15 Units Subcutaneous TID WC  . metoprolol succinate  75 mg Oral Daily  . pantoprazole  40 mg Oral Daily  . piperacillin-tazobactam (ZOSYN)  IV  3.375 g Intravenous Q8H  . simvastatin  40 mg Oral QHS  . sodium chloride  3 mL Intravenous Q12H  . sodium chloride  3 mL Intravenous Q12H  . sodium chloride  3 mL Intravenous Q12H    PRN Medications: sodium chloride, acetaminophen,  ondansetron (ZOFRAN) IV, sodium chloride, sodium chloride, sodium chloride, traMADol   Assessment:   1. Symptomatic Microcytic Anemia. Hb stable today at 9.4.      --EGD 12/20 with gastritis  2. CAD with Abnormal Myoview- 11/21/201/6  s/p CABG LIMA - LAD, SVG-OM2, SVG-PDA (11/2010)     --R/LHC yesterday shows mildly elevated filling pressures, 2/3 grafts patent (SVG-OM2, LIMA-LAD).  3. Chronic Systolic Heart Failure - ICM EF 20%. ECHO yesterday showing EF 15-20%.  4. DMII 5. Hyperlipidemia  6. RLE wound, nonhealing. MRI confirms osteo. Plastics to see.    Plan/Discussion:    RLE wound with osteo. Vascular discussed with patient, unwilling to go through with AKA at this time. Very adamant about leaving today. Per vascular note, plastic surgery to see today to discuss other options such as debridement and negative pressure dressing. Will continue Zosyn for now, discharge on oral Cipro.   Cath with intact revascularization. Mildly elevated filling pressures. Cardiac output ok.   Length of Stay: 3   Jones, Eden,MD 7:36 AM   Advanced Heart Failure Team Pager [phone] (M-F; 7a - 4p)  Please contact [facility] for night-coverage after hours (4p -7a ) and weekends on [email or web address]  Agree with above. Cath showed stable revascularization. Feels fine. Refusing AKA for RLE wound and probable osteo. Appreciate Dr. Adele Dan care. Will d/c on cipro 500 bid x 2 weeks and have him f/u with Dr. Jodelle Red at [facility] for further management. He refuses ICD and would avoid anyway with large wound. He wants to go home today. We will arrange discharge.   Bensimhon, Daniel,MD 9:06 AM  Addendum:  Patient seen by Plastics and now agrees to wound debridement tomorrow. Will cancel d/c and resume IV abx. I have spoke with patient, his wife and Dr. Wallene Huh, Daniel,MD 5:26 PM

## 2015-10-23 NOTE — Discharge Summary (Addendum)
Advanced Heart Failure Team  Discharge Summary   Patient ID: KENDAL RAFFO MRN: [id number], DOB/AGE: February 19, 1961 54 y.o. Admit date: 10/20/2015 D/C date:     10/25/2015   Primary Discharge Diagnoses:  Symptomatic Anemia RLE Wound/Osteomyelitis CAD w/ Abnormal Myoview  Secondary Discharge Diagnoses:  Chronic Systolic CHF HTN HLD  Hospital Course: Mr Turney is a 54 year old with a history of DM2, HL, CRI (1.4), severe venous insufficiency with previous R leg ulcers requiring vein stripping, CAD s/p CABG LIMA - LAD, SVG-OM2, SVG-PDA (11/2010) and CHF. Unable to tolerate lisinopril /ARB due to dizziness. Patient presented for left heart cath given an abnormal Myoview on 09/22/15, also had complaints of cough and SOB. Hb found to be 7.5, most likely etiology of SOB. Given 2 units PRBC's, taken for EGD which showed antral gastritis, added Protonix. Hb remained stable. Anemia panel suggestive more of anemia of chronic disease. Take for [facility], showed patent LIMA-LAD, and SVG-OM2, but occluded SVG-PDA. Right heart pressures slightly elevated, otherwise no significant findings. Patient also with large RLE chronic wound, MRI performed, suggestive of osteomyelitis. Vascular consulted, felt that AKA was best option, however, patient not willing to have AKA at this time. Plastic surgery consulted, performed debridement and placed wound vac.   Feels ok this morning. Wants to go home.   Discharge Weight Range: 149 lbs.  Discharge Vitals: Blood pressure 110/70, pulse 102, temperature 98.4 F (36.9 C), temperature source Oral, resp. rate 18, height  (1.803 m), weight 67.813 kg (149 lb 8 oz), SpO2 97 %. Physical Exam: General: Well appearing. No resp difficulty HEENT: normal Neck: supple. JVP 5-6. Carotids 2+ bilat; no bruits. No lymphadenopathy or thryomegaly appreciated. Cor: PMI nondisplaced. Regular rate & rhythm. No rubs, gallops or murmurs. Lungs: clear Abdomen: soft, nontender,  nondistended. No hepatosplenomegaly. No bruits or masses. Good bowel sounds. Extremities: no cyanosis, clubbing, rash, RLE with wound vac Neuro: alert & oriented x3, cranial nerves grossly intact. moves all 4 extremities w/o difficulty. Affect pleasant   Labs: Lab Results  Component Value Date   WBC 6.3 10/23/2015   HGB 9.4* 10/23/2015   HCT 33.1* 10/23/2015   MCV 80.3 10/23/2015   PLT 205 10/23/2015     Recent Labs Lab 10/23/15 0348  NA 136  K 4.7  CL 104  CO2 25  BUN 18  CREATININE 1.19  CALCIUM 8.7*  GLUCOSE 146*     Diagnostic Studies/Procedures   No results found.  Discharge Medications     Medication List    TAKE these medications        Acetaminophen 500 MG coapsule  Take 500 mg by mouth every 4 (four) hours as needed.     aspirin EC 81 MG tablet  Take 1 tablet (81 mg total) by mouth daily.     ciprofloxacin 750 MG tablet  Commonly known as:  CIPRO  Take 1 tablet (750 mg total) by mouth 2 (two) times daily.     cyclobenzaprine 10 MG tablet  Commonly known as:  FLEXERIL  Take 10 mg by mouth at bedtime.     furosemide 20 MG tablet  Commonly known as:  LASIX  Take 20 mg tablet as needed for increased weight gain.     glimepiride 4 MG tablet  Commonly known as:  AMARYL  Take 8 mg by mouth daily with breakfast. Take two tablets by mouth daily.     metoprolol succinate 50 MG 24 hr tablet  Commonly known as:  TOPROL-XL  Take 75 mg (1.5 tabs) daily.     pantoprazole 40 MG tablet  Commonly known as:  PROTONIX  Take 1 tablet (40 mg total) by mouth daily.     simvastatin 40 MG tablet  Commonly known as:  ZOCOR  Take 1 tablet (40 mg total) by mouth at bedtime.     spironolactone 25 MG tablet  Commonly known as:  ALDACTONE  Take 12.5 mg by mouth daily.     traMADol 50 MG tablet  Commonly known as:  ULTRAM  Take 1 tablet (50 mg total) by mouth every 6 (six) hours as needed.        Disposition   The patient will be discharged in stable  condition to home. Discharge Instructions    (HEART FAILURE PATIENTS) Call MD:  Anytime you have any of the following symptoms: 1) 3 pound weight gain in 24 hours or 5 pounds in 1 week 2) shortness of breath, with or without a dry hacking cough 3) swelling in the hands, feet or stomach 4) if you have to sleep on extra pillows at night in order to breathe.    Complete by:  As directed      Call MD for:  redness, tenderness, or signs of infection (pain, swelling, redness, odor or green/yellow discharge around incision site)    Complete by:  As directed      Call MD for:  severe uncontrolled pain    Complete by:  As directed      Call MD for:  temperature >100.4    Complete by:  As directed      Diet - low sodium heart healthy    Complete by:  As directed           Follow-up Information    Follow up with Dorothey Baseman, MD On 10/28/2015.   Specialty:  Family Medicine   Why:    @2 ;45pm   Contact information:   [address] [facility] MEDIC[facility] [place] [address] [phone]       Follow up with Sherryl Manges, MD On 11/11/2015.   Specialty:  Cardiology   Why:  Appt. @ 11:30am   Contact information:   [address] [place] [address] [phone]       Schedule an appointment as soon as possible for a visit with Dr. Jodelle Red at [facility].   Why:  For follow-up      Follow up with [facility]              In 1 month.   Contact information:   [address] [place] [phone]      Follow up with [facility].   Why:  Home Health RN services with [facility], RN will call patient 24-48hours post discharge to arrange initial visit.   Contact information:   [address] [place] [address] [phone]         Duration of Discharge Encounter: Greater than 35 minutes   Signed, Arvilla Meres MD 10/25/2015, 12:14 PM

## 2015-10-23 NOTE — Progress Notes (Signed)
   VASCULAR SURGERY ASSESSMENT & PLAN:  * This patient has an extensive wound on his right leg (7 cm in width by 17 cm in length). He has had this wound for a year. He has been followed at [facility] for the last 2 months and the patient feels that the wound has improved some. His MRI of the right leg is "worrisome for osteomyelitis" in the mid tibial shaft. Based on his physical exam and arterial Doppler studies he has adequate circulation in the right leg for healing. However, given the extent of the wound I think there are only 2 options   1. Right above-the-knee amputation  2. Evaluation by plastic surgery to determine if he could undergo debridement and placement of a   negative pressure dressing.  * These options had been discussed with him in [facility].  I had a long discussion with him this morning. Currently he is not willing to consider right above-the-knee amputation. He is willing to have a plastic surgery evaluation here in [place] and I will try to call in between cases when the office is open.  * With respect to his antibiotics, unfortunately there is no way to get a culture of the bone short of a biopsy. I think it is reasonable to continue his IV Zosyn while he is in the hospital. If he is discharged, po Cipro would be a reasonable choice.  SUBJECTIVE: No specific complaints.  PHYSICAL EXAM: Filed Vitals:   10/22/15 1241 10/22/15 2026 10/23/15 0116 10/23/15 0525  BP: 111/79 104/65 110/73 114/73  Pulse: 87 95 98 96  Temp: 98 F (36.7 C) 97.8 F (36.6 C) 97.7 F (36.5 C) 98.5 F (36.9 C)  TempSrc: Oral Oral Oral Oral  Resp: 18 18 16 14   Height:      Weight:    156 lb 4.8 oz (70.897 kg)  SpO2: 100% 96% 98% 95%   Dressing with minimal drainage.   LABS: Lab Results  Component Value Date   WBC 6.3 10/23/2015   HGB 9.4* 10/23/2015   HCT 33.1* 10/23/2015   MCV 80.3 10/23/2015   PLT 205 10/23/2015   Lab Results  Component Value  Date   CREATININE 1.19 10/23/2015   Lab Results  Component Value Date   INR 1.29 10/20/2015   CBG (last 3)   Recent Labs  10/22/15 1634 10/22/15 2209 10/23/15 0521  GLUCAP 139* 187* 140*    Active Problems:   Anemia   Congestive heart failure (HCC)   DOE (dyspnea on exertion)   Abnormal finding on thallium stress test   Cari Caraway Beeper: [phone] 10/23/2015

## 2015-10-23 NOTE — Progress Notes (Addendum)
ANTIBIOTIC CONSULT NOTE  Pharmacy Consult for Zosyn Indication: RLE wound infection - suspect pseduomonas  No Known Allergies  Patient Measurements: Height: 5\' 11"  (180.3 cm) Weight: 156 lb 4.8 oz (70.897 kg) (scale b) IBW/kg (Calculated) : 75.3   Vital Signs: Temp: 97.6 F (36.4 C) (12/22 1208) Temp Source: Oral (12/22 1208) BP: 123/79 mmHg (12/22 1208) Pulse Rate: 99 (12/22 1208) Intake/Output from previous day: 12/21 0701 - 12/22 0700 In: 870 [P.O.:720; IV Piggyback:150] Out: 0  Intake/Output from this shift: Total I/O In: 480 [P.O.:480] Out: -   Labs:  Recent Labs  10/21/15 0625 10/22/15 0625 10/23/15 0348  WBC 6.2 6.6 6.3  HGB 9.0* 9.4* 9.4*  PLT 229 210 205  CREATININE 1.18 1.27* 1.19   Estimated Creatinine Clearance: 69.5 mL/min (by C-G formula based on Cr of 1.19). No results for input(s): VANCOTROUGH, VANCOPEAK, VANCORANDOM, GENTTROUGH, GENTPEAK, GENTRANDOM, TOBRATROUGH, TOBRAPEAK, TOBRARND, AMIKACINPEAK, AMIKACINTROU, AMIKACIN in the last 72 hours.   Microbiology: No results found for this or any previous visit (from the past 720 hour(s)).  Medical History: Past Medical History  Diagnosis Date  . Coronary artery disease 2012  . Diabetes mellitus 2003  . Hyperlipidemia   . Ischemic cardiomyopathy   . Chronic renal insufficiency   . Shingles 2003  . Proliferative retinopathy 2015    treated with injection of Avastin.    Assessment: 54 yom on abx D#2 for RLE wound infection - suspect pseduomonas. Patient staying for wound clean out 12/23. Consulted to continue Zosyn while inpatient. Dose missed this afternoon due to loss of IV access per RN comment. Re-timed Zosyn dose for now. CrCL~70, afeb, wbc wnl.   Goal of Therapy:  Eradication of infection  Plan:  Zosyn 3.375g IV q8h - re-timed due to missed dose this afternoon Monitor clinical progress, c/s, renal function, abx plan/LOT  Babs Bertin, PharmD, BCPS Clinical Pharmacist Pager  [phone] 10/23/2015 5:55 PM   Pharmacy to sign off and follow peripherally for renal changes Thank you. Okey Regal, PharmD [phone]

## 2015-10-24 ENCOUNTER — Other Ambulatory Visit: Payer: Managed Care, Other (non HMO) | Admitting: Plastic Surgery

## 2015-10-24 ENCOUNTER — Encounter (HOSPITAL_COMMUNITY)
Admission: AD | Disposition: A | Discharge status: Home Health | Payer: Self-pay | Source: Ambulatory Visit | Attending: Internal Medicine

## 2015-10-24 ENCOUNTER — Encounter (HOSPITAL_COMMUNITY): Payer: Self-pay | Admitting: Certified Registered Nurse Anesthetist

## 2015-10-24 ENCOUNTER — Inpatient Hospital Stay (HOSPITAL_COMMUNITY): Payer: Managed Care, Other (non HMO) | Admitting: Certified Registered Nurse Anesthetist

## 2015-10-24 DIAGNOSIS — S81801A Unspecified open wound, right lower leg, initial encounter: Secondary | ICD-10-CM

## 2015-10-24 HISTORY — PX: APPLICATION OF A-CELL OF EXTREMITY: SHX6303

## 2015-10-24 HISTORY — PX: I & D EXTREMITY: SHX5045

## 2015-10-24 LAB — GLUCOSE, CAPILLARY
GLUCOSE-CAPILLARY: 101 mg/dL — AB (ref 65–99)
GLUCOSE-CAPILLARY: 75 mg/dL (ref 65–99)
GLUCOSE-CAPILLARY: 85 mg/dL (ref 65–99)
Glucose-Capillary: 114 mg/dL — ABNORMAL HIGH (ref 65–99)
Glucose-Capillary: 72 mg/dL (ref 65–99)
Glucose-Capillary: 65 mg/dL (ref 65–99)
Glucose-Capillary: 163 mg/dL — ABNORMAL HIGH (ref 65–99)

## 2015-10-24 SURGERY — IRRIGATION AND DEBRIDEMENT EXTREMITY
Anesthesia: General | Laterality: Right

## 2015-10-24 MED ORDER — LACTATED RINGERS IV SOLN
INTRAVENOUS | Status: DC
  Administered 2015-10-24 (×2): via INTRAVENOUS

## 2015-10-24 MED ORDER — FENTANYL CITRATE (PF) 100 MCG/2ML IJ SOLN
INTRAMUSCULAR | Status: DC | PRN
  Administered 2015-10-24: 100 ug via INTRAVENOUS
  Administered 2015-10-24: 25 ug via INTRAVENOUS

## 2015-10-24 MED ORDER — ONDANSETRON HCL 4 MG/2ML IJ SOLN
INTRAMUSCULAR | Status: DC | PRN
  Administered 2015-10-24: 4 mg via INTRAVENOUS

## 2015-10-24 MED ORDER — DEXTROSE 50 % IV SOLN
INTRAVENOUS | Status: AC
  Administered 2015-10-24: 25 mL
  Filled 2015-10-24: qty 50

## 2015-10-24 MED ORDER — SODIUM CHLORIDE 0.9 % IR SOLN
Status: DC | PRN
  Administered 2015-10-24: 1000 mL

## 2015-10-24 MED ORDER — SODIUM CHLORIDE 0.9 % IR SOLN
Status: DC | PRN
  Administered 2015-10-24: 500 mL

## 2015-10-24 MED ORDER — HYDROMORPHONE HCL 1 MG/ML IJ SOLN: 0.2500 mg | INTRAMUSCULAR | Status: DC | PRN

## 2015-10-24 MED ORDER — DEXTROSE 50 % IV SOLN
25.0000 mL | Freq: Once | INTRAVENOUS | Status: DC
  Filled 2015-10-24: qty 50

## 2015-10-24 MED ORDER — CEFAZOLIN SODIUM-DEXTROSE 2-3 GM-% IV SOLR: 2.0000 g | INTRAVENOUS | Status: DC

## 2015-10-24 MED ORDER — PROPOFOL 10 MG/ML IV BOLUS
INTRAVENOUS | Status: AC
  Filled 2015-10-24: qty 20

## 2015-10-24 MED ORDER — PROPOFOL 10 MG/ML IV BOLUS
INTRAVENOUS | Status: DC | PRN
  Administered 2015-10-24: 70 mg via INTRAVENOUS

## 2015-10-24 MED ORDER — FENTANYL CITRATE (PF) 250 MCG/5ML IJ SOLN
INTRAMUSCULAR | Status: AC
  Filled 2015-10-24: qty 5

## 2015-10-24 MED ORDER — PHENYLEPHRINE HCL 10 MG/ML IJ SOLN
INTRAMUSCULAR | Status: DC | PRN
  Administered 2015-10-24: 120 ug via INTRAVENOUS
  Administered 2015-10-24: 200 ug via INTRAVENOUS
  Administered 2015-10-24: 80 ug via INTRAVENOUS
  Administered 2015-10-24: 120 ug via INTRAVENOUS
  Administered 2015-10-24: 80 ug via INTRAVENOUS
  Administered 2015-10-24 (×2): 200 ug via INTRAVENOUS
  Administered 2015-10-24 (×3): 160 ug via INTRAVENOUS

## 2015-10-24 MED ORDER — LIDOCAINE HCL (CARDIAC) 20 MG/ML IV SOLN
INTRAVENOUS | Status: DC | PRN
Start: 2015-10-24 — End: 2015-10-24
  Administered 2015-10-24: 50 mg via INTRAVENOUS

## 2015-10-24 MED ORDER — MIDAZOLAM HCL 2 MG/2ML IJ SOLN
INTRAMUSCULAR | Status: AC
  Filled 2015-10-24: qty 2

## 2015-10-24 SURGICAL SUPPLY — 49 items
BANDAGE ELASTIC 4 VELCRO ST LF (GAUZE/BANDAGES/DRESSINGS) ×2 IMPLANT
BLADE SURG ROTATE 9660 (MISCELLANEOUS) IMPLANT
BNDG GAUZE ELAST 4 BULKY (GAUZE/BANDAGES/DRESSINGS) ×2 IMPLANT
CANISTER SUCTION 2500CC (MISCELLANEOUS) ×2 IMPLANT
CANISTER WOUND CARE 500ML ATS (WOUND CARE) ×2 IMPLANT
CHLORAPREP W/TINT 26ML (MISCELLANEOUS) IMPLANT
CONT SPEC 4OZ CLIKSEAL STRL BL (MISCELLANEOUS) ×2 IMPLANT
COVER SURGICAL LIGHT HANDLE (MISCELLANEOUS) ×2 IMPLANT
DRAPE EXTREMITY T 121X128X90 (DRAPE) ×2 IMPLANT
DRAPE INCISE IOBAN 66X45 STRL (DRAPES) ×2 IMPLANT
DRAPE ORTHO SPLIT 77X108 STRL (DRAPES)
DRAPE SURG ORHT 6 SPLT 77X108 (DRAPES) IMPLANT
DRSG ADAPTIC 3X8 NADH LF (GAUZE/BANDAGES/DRESSINGS) ×2 IMPLANT
DRSG PAD ABDOMINAL 8X10 ST (GAUZE/BANDAGES/DRESSINGS) IMPLANT
DRSG VAC ATS LRG SENSATRAC (GAUZE/BANDAGES/DRESSINGS) IMPLANT
DRSG VAC ATS MED SENSATRAC (GAUZE/BANDAGES/DRESSINGS) ×2 IMPLANT
DRSG VAC ATS SM SENSATRAC (GAUZE/BANDAGES/DRESSINGS) IMPLANT
ELECT CAUTERY BLADE 6.4 (BLADE) ×2 IMPLANT
ELECT REM PT RETURN 9FT ADLT (ELECTROSURGICAL) ×2
ELECTRODE REM PT RTRN 9FT ADLT (ELECTROSURGICAL) ×1 IMPLANT
GAUZE SPONGE 4X4 12PLY STRL (GAUZE/BANDAGES/DRESSINGS) IMPLANT
GEL ULTRASOUND 20GR AQUASONIC (MISCELLANEOUS) IMPLANT
GLOVE BIO SURGEON STRL SZ 6.5 (GLOVE) ×4 IMPLANT
GLOVE ECLIPSE 6.5 STRL STRAW (GLOVE) ×2 IMPLANT
GOWN STRL REUS W/ TWL LRG LVL3 (GOWN DISPOSABLE) ×3 IMPLANT
GOWN STRL REUS W/TWL LRG LVL3 (GOWN DISPOSABLE) ×3
HANDPIECE INTERPULSE COAX TIP (DISPOSABLE)
KIT BASIN OR (CUSTOM PROCEDURE TRAY) ×2 IMPLANT
KIT ROOM TURNOVER OR (KITS) ×2 IMPLANT
MATRIX SURGICAL PSM 10X15CM (Tissue) ×2 IMPLANT
MICROMATRIX 1000MG (Tissue) ×2 IMPLANT
NS IRRIG 1000ML POUR BTL (IV SOLUTION) ×2 IMPLANT
PACK GENERAL/GYN (CUSTOM PROCEDURE TRAY) ×2 IMPLANT
PACK ORTHO EXTREMITY (CUSTOM PROCEDURE TRAY) ×2 IMPLANT
PAD ARMBOARD 7.5X6 YLW CONV (MISCELLANEOUS) ×4 IMPLANT
PAD NEG PRESSURE SENSATRAC (MISCELLANEOUS) IMPLANT
SET HNDPC FAN SPRY TIP SCT (DISPOSABLE) IMPLANT
SOLUTION PARTIC MCRMTRX 1000MG (Tissue) ×1 IMPLANT
STAPLER VISISTAT 35W (STAPLE) ×2 IMPLANT
STOCKINETTE IMPERVIOUS 9X36 MD (GAUZE/BANDAGES/DRESSINGS) IMPLANT
STOCKINETTE IMPERVIOUS LG (DRAPES) IMPLANT
SUT SILK 4 0 P 3 (SUTURE) ×2 IMPLANT
SUT VIC AB 5-0 PS2 18 (SUTURE) ×4 IMPLANT
TOWEL OR 17X24 6PK STRL BLUE (TOWEL DISPOSABLE) ×2 IMPLANT
TOWEL OR 17X26 10 PK STRL BLUE (TOWEL DISPOSABLE) ×2 IMPLANT
TUBE CONNECTING 12X1/4 (SUCTIONS) ×2 IMPLANT
UNDERPAD 30X30 INCONTINENT (UNDERPADS AND DIAPERS) ×2 IMPLANT
WATER STERILE IRR 1000ML POUR (IV SOLUTION) IMPLANT
YANKAUER SUCT BULB TIP NO VENT (SUCTIONS) ×2 IMPLANT

## 2015-10-24 NOTE — Care Management Note (Signed)
Case Management Note  Patient Details  Name: Vincent Brown MRN: [id number] Date of Birth: 26-Feb-1961  Subjective/Objective:       S/P Incision and Debridement of right leg ulcer with vac.           Action/Plan: CM met with patient and wife to discuss the recommendations for care transitional, home wound vac management system, HHRN,  Patient and wife agreeable with discharge plan. CM spoke with [employer] rep Ricki concerning wound vac, ETA was confirmed for Saturday 12/24 between 8-9 am patient and wife made aware.  Referral was faxed in to [facility] [phone], made thenm aware due to holiday observance on Monday, Nurse will contact them at veriified contact info on Tuesday, Patient and wife agreeable. Updated Norvel Richards RN. No further CM needs identified.  Expected Discharge Date:   10/25/2015               Expected Discharge Plan:  [facility]  In-House Referral:     Discharge planning Services  CM Consult  Post Acute Care Choice:    Choice offered to:     DME Arranged:  Vac DME Agency:  [employer]  HH Arranged:  RN (Wound Care management) [place] Agency:  [place]  Status of Service:  Completed, signed off  Medicare Important Message Given:    Date Medicare IM Given:    Medicare IM give by:    Date Additional Medicare IM Given:    Additional Medicare Important Message give by:     If discussed at [place] of Stay Meetings, dates discussed:    Additional CommentsLaurena Slimmer, RN 10/24/2015, 8:39 PM

## 2015-10-24 NOTE — Interval H&P Note (Signed)
History and Physical Interval Note:  10/24/2015 1:55 PM  Vincent Brown  has presented today for surgery, with the diagnosis of .  The various methods of treatment have been discussed with the patient and family. After consideration of risks, benefits and other options for treatment, the patient has consented to  Procedure(s): IRRIGATION AND DEBRIDEMENT EXTREMITY (Left) APPLICATION OF A-CELL AND PLACEMENT OF WOUND VAC (Left) as a surgical intervention .  The patient's history has been reviewed, patient examined, no change in status, stable for surgery.  I have reviewed the patient's chart and labs.  Questions were answered to the patient's satisfaction.     Peggye Form

## 2015-10-24 NOTE — Brief Op Note (Addendum)
10/20/2015 - 10/24/2015  3:42 PM  PATIENT:  Gemma Payor  54 y.o. male  PRE-OPERATIVE DIAGNOSIS:  Right leg ulcer  POST-OPERATIVE DIAGNOSIS:  same  PROCEDURE:  Procedure(s): IRRIGATION AND DEBRIDEMENT EXTREMITY (Right) APPLICATION OF A-CELL AND PLACEMENT OF WOUND VAC (Right)  SURGEON:  Surgeon(s) and Role:    * Claire S Dillingham, DO - Primary  ASSISTANTS: none   ANESTHESIA:   general  EBL:  Total I/O In: 50 [P.O.:50] Out: 1075 [Urine:1075]  BLOOD ADMINISTERED:none  DRAINS: none   LOCAL MEDICATIONS USED:  NONE  SPECIMEN:  Right leg tissue (calcified)  DISPOSITION OF SPECIMEN:  Path and micro  COUNTS:  YES  TOURNIQUET:  * No tourniquets in log *  DICTATION: .Dragon Dictation  PLAN OF CARE: Admit to inpatient   PATIENT DISPOSITION:  PACU - hemodynamically stable.   Delay start of Pharmacological VTE agent (>24hrs) due to surgical blood loss or risk of bleeding: no

## 2015-10-24 NOTE — H&P (View-Only) (Signed)
Reason for Consult: right leg wound Referring Physician: Dr. Deitra Mayo  Vincent Brown is an 54 y.o. male.  HPI: The patient is a 54 yrs old wm here with his wife for treatment of his right leg.  He injured his leg over a year ago with a log chain.  He has a farm and land.  He was cleaning up the logs when he got injured on the leg.  He has been using hydrogen peroxide until 2 months ago.  He started to see a vascular surgeon in [place] and was being treated with a UNNA boot.  The area is tender and there is muscle exposed and poor flow to the surrounding skin.  There is a pedal pulse.  The films were reviewed and concerning for deep infection.  Past Medical History  Diagnosis Date  . Coronary artery disease 2012  . Diabetes mellitus 2003  . Hyperlipidemia   . Ischemic cardiomyopathy   . Chronic renal insufficiency   . Shingles 2003  . Proliferative retinopathy 2015    treated with injection of Avastin.     Past Surgical History  Procedure Laterality Date  . Varicose vein surgery  1995    right lower extremity  . Coronary artery bypass graft  11/05/2010  . Appendectomy  1971  . Refractive surgery  2009, 2015  . Colonoscopy  10/2012    Dr April Manson in [place]. mall sigmoid tics, small internal hemorrhoids.  otherwise normal screening study.  no polyps, telangectasia...  . Esophagogastroduodenoscopy (egd) with propofol N/A 10/21/2015    Procedure: ESOPHAGOGASTRODUODENOSCOPY (EGD) WITH PROPOFOL;  Surgeon: Mauri Pole, MD;  Location: [facility];  Service: Endoscopy;  Laterality: N/A;  . Cardiac catheterization N/A 10/22/2015    Procedure: Right/Left Heart Cath and Coronary/Graft Angiography;  Surgeon: Jolaine Artist, MD;  Location: [facility];  Service: Cardiovascular;  Laterality: N/A;    Family History  Problem Relation Age of Onset  . Hypertension Father     Social History:  reports that he has never smoked. He has never used smokeless tobacco. He  reports that he does not drink alcohol or use illicit drugs.  Allergies: No Known Allergies  Medications: I have reviewed the patient's current medications.  Results for orders placed or performed during the hospital encounter of 10/20/15 (from the past 48 hour(s))  Glucose, capillary     Status: Abnormal   Collection Time: 10/21/15  5:16 PM  Result Value Ref Range   Glucose-Capillary 166 (H) 65 - 99 mg/dL  Glucose, capillary     Status: Abnormal   Collection Time: 10/21/15 10:22 PM  Result Value Ref Range   Glucose-Capillary 123 (H) 65 - 99 mg/dL  Glucose, capillary     Status: Abnormal   Collection Time: 10/22/15  6:13 AM  Result Value Ref Range   Glucose-Capillary 120 (H) 65 - 99 mg/dL  Basic metabolic panel     Status: Abnormal   Collection Time: 10/22/15  6:25 AM  Result Value Ref Range   Sodium 137 135 - 145 mmol/L   Potassium 5.0 3.5 - 5.1 mmol/L   Chloride 104 101 - 111 mmol/L   CO2 28 22 - 32 mmol/L   Glucose, Bld 126 (H) 65 - 99 mg/dL   BUN 19 6 - 20 mg/dL   Creatinine, Ser 1.27 (H) 0.61 - 1.24 mg/dL   Calcium 8.8 (L) 8.9 - 10.3 mg/dL   GFR calc non Af Amer >60 >60 mL/min   GFR  calc Af Amer >60 >60 mL/min    Comment: (NOTE) The eGFR has been calculated using the CKD EPI equation. This calculation has not been validated in all clinical situations. eGFR's persistently <60 mL/min signify possible Chronic Kidney Disease.    Anion gap 5 5 - 15  CBC with Differential/Platelet     Status: Abnormal   Collection Time: 10/22/15  6:25 AM  Result Value Ref Range   WBC 6.6 4.0 - 10.5 K/uL   RBC 4.09 (L) 4.22 - 5.81 MIL/uL   Hemoglobin 9.4 (L) 13.0 - 17.0 g/dL   HCT 33.0 (L) 39.0 - 52.0 %   MCV 80.7 78.0 - 100.0 fL   MCH 23.0 (L) 26.0 - 34.0 pg   MCHC 28.5 (L) 30.0 - 36.0 g/dL   RDW 20.0 (H) 11.5 - 15.5 %   Platelets 210 150 - 400 K/uL   Neutrophils Relative % 66 %   Neutro Abs 4.4 1.7 - 7.7 K/uL   Lymphocytes Relative 23 %   Lymphs Abs 1.5 0.7 - 4.0 K/uL    Monocytes Relative 7 %   Monocytes Absolute 0.5 0.1 - 1.0 K/uL   Eosinophils Relative 3 %   Eosinophils Absolute 0.2 0.0 - 0.7 K/uL   Basophils Relative 1 %   Basophils Absolute 0.0 0.0 - 0.1 K/uL  Glucose, capillary     Status: Abnormal   Collection Time: 10/22/15  8:14 AM  Result Value Ref Range   Glucose-Capillary 104 (H) 65 - 99 mg/dL  I-STAT 3, arterial blood gas (G3+)     Status: Abnormal   Collection Time: 10/22/15  9:16 AM  Result Value Ref Range   pH, Arterial 7.422 7.350 - 7.450   pCO2 arterial 37.6 35.0 - 45.0 mmHg   pO2, Arterial 51.0 (L) 80.0 - 100.0 mmHg   Bicarbonate 24.5 (H) 20.0 - 24.0 mEq/L   TCO2 26 0 - 100 mmol/L   O2 Saturation 87.0 %   Patient temperature HIDE    Sample type ARTERIAL   I-STAT 3, venous blood gas (G3P V)     Status: Abnormal   Collection Time: 10/22/15  9:22 AM  Result Value Ref Range   pH, Ven 7.416 (H) 7.250 - 7.300   pCO2, Ven 42.1 (L) 45.0 - 50.0 mmHg   pO2, Ven 30.0 30.0 - 45.0 mmHg   Bicarbonate 27.1 (H) 20.0 - 24.0 mEq/L   TCO2 28 0 - 100 mmol/L   O2 Saturation 57.0 %   Acid-Base Excess 2.0 0.0 - 2.0 mmol/L   Patient temperature HIDE    Sample type VENOUS    Comment NOTIFIED PHYSICIAN   I-STAT 3, venous blood gas (G3P V)     Status: Abnormal   Collection Time: 10/22/15  9:23 AM  Result Value Ref Range   pH, Ven 7.409 (H) 7.250 - 7.300   pCO2, Ven 41.2 (L) 45.0 - 50.0 mmHg   pO2, Ven 29.0 (LL) 30.0 - 45.0 mmHg   Bicarbonate 26.1 (H) 20.0 - 24.0 mEq/L   TCO2 27 0 - 100 mmol/L   O2 Saturation 56.0 %   Acid-Base Excess 1.0 0.0 - 2.0 mmol/L   Patient temperature HIDE    Sample type VENOUS    Comment NOTIFIED PHYSICIAN   Glucose, capillary     Status: Abnormal   Collection Time: 10/22/15 10:26 AM  Result Value Ref Range   Glucose-Capillary 103 (H) 65 - 99 mg/dL  Glucose, capillary     Status: None   Collection Time:  10/22/15 11:36 AM  Result Value Ref Range   Glucose-Capillary 90 65 - 99 mg/dL  Glucose, capillary      Status: Abnormal   Collection Time: 10/22/15  4:34 PM  Result Value Ref Range   Glucose-Capillary 139 (H) 65 - 99 mg/dL   Comment 1 Notify RN   Glucose, capillary     Status: Abnormal   Collection Time: 10/22/15 10:09 PM  Result Value Ref Range   Glucose-Capillary 187 (H) 65 - 99 mg/dL   Comment 1 Notify RN    Comment 2 Document in Chart   Basic metabolic panel     Status: Abnormal   Collection Time: 10/23/15  3:48 AM  Result Value Ref Range   Sodium 136 135 - 145 mmol/L   Potassium 4.7 3.5 - 5.1 mmol/L   Chloride 104 101 - 111 mmol/L   CO2 25 22 - 32 mmol/L   Glucose, Bld 146 (H) 65 - 99 mg/dL   BUN 18 6 - 20 mg/dL   Creatinine, Ser 1.19 0.61 - 1.24 mg/dL   Calcium 8.7 (L) 8.9 - 10.3 mg/dL   GFR calc non Af Amer >60 >60 mL/min   GFR calc Af Amer >60 >60 mL/min    Comment: (NOTE) The eGFR has been calculated using the CKD EPI equation. This calculation has not been validated in all clinical situations. eGFR's persistently <60 mL/min signify possible Chronic Kidney Disease.    Anion gap 7 5 - 15  CBC     Status: Abnormal   Collection Time: 10/23/15  3:48 AM  Result Value Ref Range   WBC 6.3 4.0 - 10.5 K/uL   RBC 4.12 (L) 4.22 - 5.81 MIL/uL   Hemoglobin 9.4 (L) 13.0 - 17.0 g/dL   HCT 33.1 (L) 39.0 - 52.0 %   MCV 80.3 78.0 - 100.0 fL   MCH 22.8 (L) 26.0 - 34.0 pg   MCHC 28.4 (L) 30.0 - 36.0 g/dL   RDW 20.3 (H) 11.5 - 15.5 %   Platelets 205 150 - 400 K/uL  Glucose, capillary     Status: Abnormal   Collection Time: 10/23/15  5:21 AM  Result Value Ref Range   Glucose-Capillary 140 (H) 65 - 99 mg/dL   Comment 1 Notify RN    Comment 2 Document in Chart   Glucose, capillary     Status: Abnormal   Collection Time: 10/23/15 12:07 PM  Result Value Ref Range   Glucose-Capillary 174 (H) 65 - 99 mg/dL    Mr Tibia Fibula Right Wo Contrast  10/21/2015  CLINICAL DATA:  Chronic open wound on the anterior aspect of the right lower extremity. EXAM: MRI OF LOWER RIGHT EXTREMITY  WITHOUT CONTRAST TECHNIQUE: Multiplanar, multisequence MR imaging of the right lower extremity was performed. No intravenous contrast was administered. COMPARISON:  None. FINDINGS: There is a long enlarge area of soft tissue defect involving the anterior aspect of the right she and along the anterior and medial aspect of the right tibia. Increased T2 signal intensity could be due to cellulitis. No discrete drainable abscess is identified. Mild myositis surrounding the tibia but no findings for pyomyositis. Signal abnormality in the mid tibial shaft is worrisome for osteomyelitis. IMPRESSION: Chronic open wound involving the anterior aspect of the right lower extremity with probable granulation tissue and cellulitis. No discrete drainable abscess. Edema like signal abnormality in the right mid tibia is worrisome for osteomyelitis. Electronically Signed   By: Marijo Sanes M.D.   On: 10/21/2015 22:26  Review of Systems  HENT: Negative.   Eyes: Negative.   Respiratory: Negative.   Cardiovascular: Negative.   Gastrointestinal: Negative.   Genitourinary: Negative.   Musculoskeletal: Negative.   Skin: Negative.   Neurological: Positive for weakness.  Psychiatric/Behavioral: Negative.    Blood pressure 123/79, pulse 99, temperature 97.6 F (36.4 C), temperature source Oral, resp. rate 14, height '5\' 11"'  (1.803 m), weight 70.897 kg (156 lb 4.8 oz), SpO2 100 %. Physical Exam  Constitutional: He is oriented to person, place, and time. He appears well-developed.  HENT:  Head: Normocephalic and atraumatic.  Eyes: Conjunctivae and EOM are normal. Pupils are equal, round, and reactive to light.  Respiratory: Effort normal.  Musculoskeletal:       Legs: Neurological: He is alert and oriented to person, place, and time.  Psychiatric: He has a normal mood and affect. His behavior is normal.    Assessment/Plan: Recommend debridement with possible VAC and Acell placement.  He needs protein drinks, a  prealbumin checked, multivitamin daily, Vit C 500 mg twice a day, Zinc 220 mg daily.  I expressed my concern about the leg and the need for surgical debridement.  He seems to be in denial about the severity of the situation.  Vincent Brown 10/23/2015, 12:45 PM

## 2015-10-24 NOTE — Anesthesia Procedure Notes (Signed)
Procedure Name: LMA Insertion Date/Time: 10/24/2015 3:52 PM Performed by: Adonis Housekeeper Pre-anesthesia Checklist: Patient identified, Suction available, Emergency Drugs available and Patient being monitored Patient Re-evaluated:Patient Re-evaluated prior to inductionOxygen Delivery Method: Circle system utilized Preoxygenation: Pre-oxygenation with 100% oxygen Intubation Type: IV induction LMA: LMA inserted LMA Size: 4.0 Number of attempts: 1 Placement Confirmation: breath sounds checked- equal and bilateral and positive ETCO2 Tube secured with: Tape Dental Injury: Teeth and Oropharynx as per pre-operative assessment

## 2015-10-24 NOTE — Progress Notes (Signed)
VASCULAR SURGERY  Greatly appreciate Dr. Leonie Green help. Patient is undergoing debridement in the operating room today and application of A-cell and placement of wound VAC. As per my previous note, the patient should have adequate circulation based on his physical exam and noninvasive Doppler studies. Vascular surgery will be available as needed.  Vincent Ferrari, MD, FACS Beeper [phone] Office: [phone]

## 2015-10-24 NOTE — Op Note (Signed)
Operative Note   DATE OF OPERATION: 10/24/2015  LOCATION: [facility]  SURGICAL DIVISION: Plastic Surgery  PREOPERATIVE DIAGNOSES:  Right leg ulcer  POSTOPERATIVE DIAGNOSES:  same  PROCEDURE:   1. Excisional debridement of right leg ulcer (7 x 15 x 2 cm) including skin, subcutaneous tissue and muscle.   2. Placement of Acell (powder 1 gm and sheet 10 x 15 cm) and VAC  SURGEON: Claire Sanger, DO  ANESTHESIA:  General.   COMPLICATIONS: None.   INDICATIONS FOR PROCEDURE:  The patient, Vincent Brown is a 54 y.o. male born on 05/31/1961, is here for treatment of a chronic right leg wound that the patient has been dealing with for the past year. MRN: [id number]  CONSENT:  Informed consent was obtained directly from the patient. Risks, benefits and alternatives were fully discussed. Specific risks including but not limited to bleeding, infection, hematoma, seroma, scarring, pain, infection, contracture, asymmetry, wound healing problems, and need for further surgery were all discussed. The patient did have an ample opportunity to have questions answered to satisfaction.   DESCRIPTION OF PROCEDURE:  The patient was taken to the operating room. SCDs were placed and IV antibiotics were given. The patient's operative site was prepped and draped in a sterile fashion. A time out was performed and all information was confirmed to be correct.  General anesthesia was administered.  The wound of the right leg (7 x 10 cm) was irrigated with antibiotic and saline solution.  The skin and soft tissue was debrided sharply with a #10 blade and curette.  There was hard areas that felt and looked like calcified bone.  One vein was tied off with 3-0 Vicryl due to the calcification and bleeding. Hemostasis was achieved with electrocautery.  All of the Acell powder and sheet were placed and secured with 5-0 Vicryl.  An adaptic was placed on top.  The VAC was secured and had an excellent seal.  The leg  was wrapped with a kerlex and Ace. The patient tolerated the procedure well.  There were no complications. The patient was allowed to wake from anesthesia, extubated and taken to the recovery room in satisfactory condition.

## 2015-10-24 NOTE — Progress Notes (Signed)
Advanced Heart Failure Rounding Note   Subjective:    Plan for OR today with A Cell and VAC applications.   Denies SOB. Wants to go home.   ECHO performed yesterday evening shows EF of 15-20%, PAP 35 mm Hg.   Objective:   Weight Range:  Vital Signs:   Temp:  [97.6 F (36.4 C)-97.8 F (36.6 C)] 97.8 F (36.6 C) (12/23 0546) Pulse Rate:  [90-99] 90 (12/23 0546) Resp:  [15-16] 15 (12/23 0546) BP: (100-123)/(65-79) 104/68 mmHg (12/23 0546) SpO2:  [94 %-100 %] 96 % (12/23 0546) Weight:  [156 lb 2.8 oz (70.84 kg)] 156 lb 2.8 oz (70.84 kg) (12/23 0546) Last BM Date: 10/21/15  Weight change: Filed Weights   10/22/15 0421 10/23/15 0525 10/24/15 0546  Weight: 161 lb 11.2 oz (73.347 kg) 156 lb 4.8 oz (70.897 kg) 156 lb 2.8 oz (70.84 kg)    Intake/Output:   Intake/Output Summary (Last 24 hours) at 10/24/15 1115 Last data filed at 10/24/15 0820  Gross per 24 hour  Intake    680 ml  Output    875 ml  Net   -195 ml     Physical Exam: General: Well appearing. No resp difficulty. In bed.  HEENT: normal Neck: supple. JVP 7-8. Carotids 2+ bilat; no bruits. No lymphadenopathy or thryomegaly appreciated. Cor: PMI nondisplaced. Regular rate & rhythm. No rubs, gallops or murmurs. Lungs: clear Abdomen: soft, nontender, nondistended. No hepatosplenomegaly. No bruits or masses. Good bowel sounds. Extremities: no cyanosis, clubbing, rash, RLE unna boot + odor Neuro: alert & oriented x3, cranial nerves grossly intact. moves all 4 extremities w/o difficulty. Affect pleasant  Telemetry: NSR  Labs: Basic Metabolic Panel:  Recent Labs Lab 10/20/15 0638 10/21/15 0625 10/22/15 0625 10/23/15 0348  NA 137 138 137 136  K 4.2 4.6 5.0 4.7  CL 103 103 104 104  CO2 27 27 28 25   GLUCOSE 78 77 126* 146*  BUN 20 19 19 18   CREATININE 0.99 1.18 1.27* 1.19  CALCIUM 8.4* 8.8* 8.8* 8.7*   CBC:  Recent Labs Lab 10/20/15 0638 10/21/15 0625 10/22/15 0625 10/23/15 0348  WBC 6.7 6.2  6.6 6.3  NEUTROABS  --  4.0 4.4  --   HGB 7.5* 9.0* 9.4* 9.4*  HCT 28.5* 32.4* 33.0* 33.1*  MCV 78.7 80.2 80.7 80.3  PLT 232 229 210 205     Other results:  Imaging: No results found.   Medications:     Scheduled Medications: . aspirin EC  81 mg Oral Daily  .  ceFAZolin (ANCEF) IV  2 g Intravenous On Call to OR  . cyclobenzaprine  10 mg Oral QHS  . enoxaparin (LOVENOX) injection  40 mg Subcutaneous Q24H  . glimepiride  8 mg Oral Q breakfast  . insulin aspart  0-15 Units Subcutaneous TID WC  . metoprolol succinate  75 mg Oral Daily  . pantoprazole  40 mg Oral Daily  . piperacillin-tazobactam (ZOSYN)  IV  3.375 g Intravenous Q8H  . simvastatin  40 mg Oral QHS  . sodium chloride  3 mL Intravenous Q12H  . sodium chloride  3 mL Intravenous Q12H  . sodium chloride  3 mL Intravenous Q12H    PRN Medications: sodium chloride, acetaminophen, ondansetron (ZOFRAN) IV, sodium chloride, sodium chloride, sodium chloride, traMADol   Assessment:   1. Symptomatic Microcytic Anemia. Hb stable today at 9.4.      --EGD 12/20 with gastritis  2. CAD with Abnormal Myoview- 11/21/201/6  s/p CABG  LIMA - LAD, SVG-OM2, SVG-PDA (11/2010)     --R/LHC yesterday shows mildly elevated filling pressures, 2/3 grafts patent (SVG-OM2, LIMA-LAD).  3. Chronic Systolic Heart Failure - ICM EF 20%. ECHO yesterday showing EF 15-20%.  4. DMII 5. Hyperlipidemia  6. RLE wound, nonhealing. MRI confirms osteo. Plastics to see.    Plan/Discussion:    RLE wound with osteo. Plastic Surgeries consult appreciated Plan for OR today with application of A-Cell and VAC. Continue antibiotics.   Cath with intact revascularization. Mildly elevated filling pressures. Cardiac output ok.  Possible D/C 24-48 hours  Length of Stay: 4   Amy Clegg NP-C 9:27 AM   Advanced Heart Failure Team Pager [phone] (M-F; 7a - 4p)  Please contact [facility] for night-coverage after hours (4p -7a ) and weekends on  [email or web address]   Patient seen and examined with Tonye Becket, NP. We discussed all aspects of the encounter. I agree with the assessment and plan as stated above.   Stable from a cardiac perspective. To OR today for wound debridement. Appreciate Plastics and VVS.   Zonia Caplin,MD 3:34 PM

## 2015-10-24 NOTE — Transfer of Care (Signed)
Immediate Anesthesia Transfer of Care Note  Patient: Vincent Brown  Procedure(s) Performed: Procedure(s): IRRIGATION AND DEBRIDEMENT EXTREMITY (Right) APPLICATION OF A-CELL AND PLACEMENT OF WOUND VAC (Right)  Patient Location: PACU  Anesthesia Type:General  Level of Consciousness: awake, alert , oriented and patient cooperative  Airway & Oxygen Therapy: Patient Spontanous Breathing and Patient connected to face mask oxygen  Post-op Assessment: Report given to RN, Post -op Vital signs reviewed and stable, Patient moving all extremities and Patient moving all extremities X 4  Post vital signs: Reviewed and stable  Last Vitals:  Filed Vitals:   10/24/15 1052 10/24/15 1131  BP: 116/79 113/79  Pulse: 96 96  Temp:  36.8 C  Resp:  16    Complications: No apparent anesthesia complications

## 2015-10-24 NOTE — Anesthesia Preprocedure Evaluation (Addendum)
Anesthesia Evaluation  Patient identified by MRN, date of birth, ID band Patient awake    Reviewed: Allergy & Precautions, H&P , NPO status , Patient's Chart, lab work & pertinent test results, reviewed documented beta blocker date and time   Airway Mallampati: I  TM Distance: >3 FB Neck ROM: Full    Dental no notable dental hx. (+) Teeth Intact, Dental Advisory Given   Pulmonary neg pulmonary ROS,    Pulmonary exam normal breath sounds clear to auscultation       Cardiovascular + CAD, + CABG and +CHF   Rhythm:Regular Rate:Normal     Neuro/Psych negative neurological ROS  negative psych ROS   GI/Hepatic negative GI ROS, Neg liver ROS,   Endo/Other  diabetes, Type 2, Oral Hypoglycemic Agents  Renal/GU Renal InsufficiencyRenal disease  negative genitourinary   Musculoskeletal   Abdominal   Peds  Hematology negative hematology ROS (+)   Anesthesia Other Findings   Reproductive/Obstetrics negative OB ROS                            Anesthesia Physical Anesthesia Plan  ASA: III  Anesthesia Plan: General   Post-op Pain Management:    Induction: Intravenous  Airway Management Planned: LMA  Additional Equipment:   Intra-op Plan:   Post-operative Plan: Extubation in OR  Informed Consent: I have reviewed the patients History and Physical, chart, labs and discussed the procedure including the risks, benefits and alternatives for the proposed anesthesia with the patient or authorized representative who has indicated his/her understanding and acceptance.   Dental advisory given  Plan Discussed with: CRNA  Anesthesia Plan Comments:        Anesthesia Quick Evaluation

## 2015-10-25 DIAGNOSIS — D649 Anemia, unspecified: Secondary | ICD-10-CM | POA: Insufficient documentation

## 2015-10-25 LAB — GLUCOSE, CAPILLARY
GLUCOSE-CAPILLARY: 130 mg/dL — AB (ref 65–99)
GLUCOSE-CAPILLARY: 143 mg/dL — AB (ref 65–99)

## 2015-10-25 NOTE — Discharge Instructions (Signed)
1. Please schedule a follow up appointment with wound care doctor within the next 1-2 weeks.   2. Please take all medications as previously prescribed with the following changes:  Start taking Cipro 750 mg twice daily.   Also start taking Protonix 40 mg daily for reflux and gastritis.   3. If you have worsening of your symptoms or new symptoms arise, please call the clinic ([phone]), or go to the ER immediately if symptoms are severe.  You have done a great job in taking all your medications. Please continue to do this.   No lifting over 5 lbs for 1 week. No sexual activity for 1 week. Keep procedure site clean & dry. If you notice increased pain, swelling, bleeding or pus, call/return!  You may shower, but no soaking baths/hot tubs/pools for 1 week.

## 2015-10-25 NOTE — Significant Event (Addendum)
Home health wound vac attached to rt leg wound, suction intact no leak noted, red drainage noted . Wife instructed on  and demonstrated use and care of wound and wound Vac. Home supplies for wound vac and wound vac dressings at bed side to take home on discharge.

## 2015-10-27 NOTE — Anesthesia Postprocedure Evaluation (Signed)
Anesthesia Post Note  Patient: TRAMARION MCPHEETERS  Procedure(s) Performed: Procedure(s) (LRB): IRRIGATION AND DEBRIDEMENT EXTREMITY (Right) APPLICATION OF A-CELL AND PLACEMENT OF WOUND VAC (Right)  Patient location during evaluation: PACU Anesthesia Type: General Level of consciousness: awake and alert Pain management: pain level controlled Vital Signs Assessment: post-procedure vital signs reviewed and stable Respiratory status: spontaneous breathing, nonlabored ventilation, respiratory function stable and patient connected to nasal cannula oxygen Cardiovascular status: blood pressure returned to baseline and stable Postop Assessment: no signs of nausea or vomiting Anesthetic complications: no    Last Vitals:  Filed Vitals:   10/25/15 0033 10/25/15 0619  BP: 103/67 110/70  Pulse: 101 102  Temp: 36.8 C 36.9 C  Resp: 18 18    Last Pain:  Filed Vitals:   10/25/15 0843  PainSc: Asleep                 Taison Celani,W. EDMOND

## 2015-10-28 ENCOUNTER — Encounter (HOSPITAL_COMMUNITY): Payer: Self-pay | Admitting: Plastic Surgery

## 2015-10-28 LAB — TISSUE CULTURE
CULTURE: NO GROWTH
GRAM STAIN: NONE SEEN

## 2015-10-28 NOTE — Anesthesia Postprocedure Evaluation (Signed)
Anesthesia Post Note  Patient: Vincent Brown  Procedure(s) Performed: Procedure(s) (LRB): ESOPHAGOGASTRODUODENOSCOPY (EGD) WITH PROPOFOL (N/A)  Patient location during evaluation: PACU Anesthesia Type: MAC Level of consciousness: awake and alert Pain management: pain level controlled Vital Signs Assessment: post-procedure vital signs reviewed and stable Respiratory status: spontaneous breathing, nonlabored ventilation, respiratory function stable and patient connected to nasal cannula oxygen Cardiovascular status: blood pressure returned to baseline and stable Postop Assessment: no signs of nausea or vomiting Anesthetic complications: no    Last Vitals:  Filed Vitals:   10/25/15 0033 10/25/15 0619  BP: 103/67 110/70  Pulse: 101 102  Temp: 36.8 C 36.9 C  Resp: 18 18    Last Pain:  Filed Vitals:   10/25/15 0843  PainSc: Asleep                 Alajia Schmelzer S

## 2015-10-29 ENCOUNTER — Telehealth: Payer: Self-pay | Admitting: *Deleted

## 2015-10-29 ENCOUNTER — Other Ambulatory Visit: Payer: Self-pay

## 2015-10-29 LAB — ANAEROBIC CULTURE: GRAM STAIN: NONE SEEN

## 2015-10-31 ENCOUNTER — Encounter: Payer: Self-pay | Admitting: Internal Medicine

## 2015-11-03 ENCOUNTER — Encounter: Payer: Self-pay | Admitting: Physician Assistant

## 2015-11-07 DIAGNOSIS — L97912 Non-pressure 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 lower leg with fat layer exposed: Secondary | ICD-10-CM | POA: Insufficient documentation

## 2015-11-11 ENCOUNTER — Encounter: Payer: Self-pay | Admitting: Physician Assistant

## 2015-11-11 ENCOUNTER — Encounter: Payer: Managed Care, Other (non HMO) | Admitting: Physician Assistant

## 2015-11-17 ENCOUNTER — Encounter: Payer: Self-pay | Admitting: *Deleted

## 2015-11-19 ENCOUNTER — Ambulatory Visit (INDEPENDENT_AMBULATORY_CARE_PROVIDER_SITE_OTHER): Payer: Managed Care, Other (non HMO) | Admitting: Infectious Disease

## 2015-11-19 ENCOUNTER — Encounter: Payer: Self-pay | Admitting: Infectious Disease

## 2015-11-19 VITALS — BP 113/75 | HR 103 | Temp 98.2°F | Wt 176.4 lb

## 2015-11-19 DIAGNOSIS — B951 Streptococcus, group B, as the cause of diseases classified elsewhere: Secondary | ICD-10-CM

## 2015-11-19 DIAGNOSIS — T148 Other injury of unspecified body region: Secondary | ICD-10-CM

## 2015-11-19 DIAGNOSIS — I5022 Chronic systolic (congestive) heart failure: Secondary | ICD-10-CM | POA: Diagnosis not present

## 2015-11-19 DIAGNOSIS — M86661 Other chronic osteomyelitis, right tibia and fibula: Secondary | ICD-10-CM | POA: Diagnosis not present

## 2015-11-19 DIAGNOSIS — L089 Local infection of the skin and subcutaneous tissue, unspecified: Secondary | ICD-10-CM

## 2015-11-19 DIAGNOSIS — A491 Streptococcal infection, unspecified site: Secondary | ICD-10-CM

## 2015-11-19 DIAGNOSIS — B966 Bacteroides fragilis [B. fragilis] as the cause of diseases classified elsewhere: Secondary | ICD-10-CM

## 2015-11-19 DIAGNOSIS — L97912 Non-pressure 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 lower leg with fat layer exposed: Secondary | ICD-10-CM | POA: Diagnosis not present

## 2015-11-19 DIAGNOSIS — R931 Abnormal findings on diagnostic imaging of heart and coronary circulation: Secondary | ICD-10-CM

## 2015-11-19 DIAGNOSIS — T148XXA Other injury of unspecified body region, initial encounter: Secondary | ICD-10-CM

## 2015-11-19 DIAGNOSIS — A498 Other bacterial infections of unspecified site: Secondary | ICD-10-CM

## 2015-11-19 DIAGNOSIS — R9439 Abnormal result of other cardiovascular function study: Secondary | ICD-10-CM

## 2015-11-19 HISTORY — DX: Streptococcal infection, unspecified site: A49.1

## 2015-11-19 HISTORY — DX: Other bacterial infections of unspecified site: A49.8

## 2015-11-19 HISTORY — DX: Other chronic osteomyelitis, right tibia and fibula: M86.661

## 2015-11-19 LAB — C-REACTIVE PROTEIN: CRP: 1.1 mg/dL — AB (ref <0.60)

## 2015-11-19 MED ORDER — METRONIDAZOLE 500 MG PO TABS: 500.0000 mg | ORAL_TABLET | Freq: Three times a day (TID) | ORAL | Status: DC

## 2015-11-19 MED ORDER — CIPROFLOXACIN HCL 750 MG PO TABS: 750.0000 mg | ORAL_TABLET | Freq: Two times a day (BID) | ORAL | Status: DC

## 2015-11-19 NOTE — Progress Notes (Signed)
Reason for consult: chronic wound with possible osteomyelitis right tibia  Requesting Physician: Dr. Marla Roe  Subjective:    Patient ID: Vincent Brown, male    DOB: [birth year], 55 y.o.   MRN: [id number]  HPI  55 year old with DM, CAD, who sustained an injury to his leg with a log chain more than a year ago. He reports that it had healed up but then he re-injured it. He had now been suffering from this chronic wound for nearly a half year since fresh injury.He was being  followed by Vascular Surgery in [place] and they had apparently obtained culture that grew group B streptococcus and also bacteroides species. He had then recently been admitted to Heart Failure team. During the admission dressing was changed and a large amount of green drainage had seeped through the dressing with a foul odor. He was found to have full thickness wounds  2X1X.2cm and 14X6X.3cm.  MRI was performed that showed possible osteomyelitis with edema of the right tibia. VVS were consulted as was Plastic Surgery.  The patient is under the impression that the idea that he might have osteomyelitis is due to an over-reaction. He does not seem to understand that MOST of the consultants had agreed that he may indeed require an right AKA.   Ultimately he was taken to the OR by Dr. Marla Roe on 10/24/15 with   1. Excisional debridement of right leg ulcer (7 x 15 x 2 cm) including skin, subcutaneous tissue and muscle.  2. Placement of Acell (powder 1 gm and sheet 10 x 15 cm) and VAC  Cultures taken in the OR grew Pseudomonas and Serraita--both S to cipro which patient has been on for nearly a month now.  He has been seen in followup by Shawn Rayburn and Dr. Marla Roe on 11/07/14  We were consulted urgently due to concerns of the risk that the patient might lose his limb with concern for osteomyelitis and exposed fat layer.  Past Medical History  Diagnosis Date  . Coronary artery disease 2012  . Diabetes mellitus  2003  . Hyperlipidemia   . Ischemic cardiomyopathy   . Chronic renal insufficiency   . Shingles 2003  . Proliferative retinopathy 2015    treated with injection of Avastin.     Past Surgical History  Procedure Laterality Date  . Varicose vein surgery  1995    right lower extremity  . Coronary artery bypass graft  11/05/2010  . Appendectomy  1971  . Refractive surgery  2009, 2015  . Colonoscopy  10/2012    Dr April Manson in [place]. mall sigmoid tics, small internal hemorrhoids.  otherwise normal screening study.  no polyps, telangectasia...  . Esophagogastroduodenoscopy (egd) with propofol N/A 10/21/2015    Procedure: ESOPHAGOGASTRODUODENOSCOPY (EGD) WITH PROPOFOL;  Surgeon: Mauri Pole, MD;  Location: [facility];  Service: Endoscopy;  Laterality: N/A;  . Cardiac catheterization N/A 10/22/2015    Procedure: Right/Left Heart Cath and Coronary/Graft Angiography;  Surgeon: Jolaine Artist, MD;  Location: [facility];  Service: Cardiovascular;  Laterality: N/A;  . I&d extremity Right 10/24/2015    Procedure: IRRIGATION AND DEBRIDEMENT EXTREMITY;  Surgeon: Loel Lofty Dillingham, DO;  Location: [facility];  Service: Plastics;  Laterality: Right;  . Application of a-cell of extremity Right 10/24/2015    Procedure: APPLICATION OF A-CELL AND PLACEMENT OF WOUND VAC;  Surgeon: Loel Lofty Dillingham, DO;  Location: [facility];  Service: Plastics;  Laterality: Right;    Family History  Problem Relation Age of Onset  . Hypertension Father       Social History   Social History  . Marital Status: Married    Spouse Name: N/A  . Number of Children: N/A  . Years of Education: N/A   Social History Main Topics  . Smoking status: Never Smoker   . Smokeless tobacco: Never Used  . Alcohol Use: No  . Drug Use: No  . Sexual Activity: Not Asked   Other Topics Concern  . None   Social History Narrative    No Known Allergies   Current outpatient prescriptions:  .  ascorbic acid  (VITAMIN C) 1000 MG tablet, Take 1,000 mg by mouth 2 (two) times daily., Disp: , Rfl:  .  aspirin EC 81 MG tablet, Take 1 tablet (81 mg total) by mouth daily., Disp: 90 tablet, Rfl: 3 .  ciprofloxacin (CIPRO) 750 MG tablet, Take 1 tablet (750 mg total) by mouth 2 (two) times daily., Disp: 60 tablet, Rfl: 1 .  cyclobenzaprine (FLEXERIL) 10 MG tablet, Take 10 mg by mouth at bedtime., Disp: , Rfl:  .  furosemide (LASIX) 20 MG tablet, Take 20 mg tablet as needed for increased weight gain. (Patient taking differently: Take 20 mg by mouth daily as needed for fluid or edema. Take 20 mg tablet as needed for increased weight gain.), Disp: 20 tablet, Rfl: 6 .  glimepiride (AMARYL) 4 MG tablet, Take 4 mg by mouth 2 (two) times daily before a meal. Take two tablets by mouth daily., Disp: , Rfl:  .  Multiple Vitamin (MULTIVITAMIN) tablet, Take 1 tablet by mouth daily., Disp: , Rfl:  .  pantoprazole (PROTONIX) 40 MG tablet, Take 1 tablet (40 mg total) by mouth daily., Disp: 30 tablet, Rfl: 5 .  spironolactone (ALDACTONE) 25 MG tablet, Take 25 mg by mouth daily. , Disp: , Rfl:  .  traMADol (ULTRAM) 50 MG tablet, Take 1 tablet (50 mg total) by mouth every 6 (six) hours as needed. (Patient taking differently: Take 50 mg by mouth every 6 (six) hours as needed (pain). ), Disp: 30 tablet, Rfl: 0 .  zinc gluconate 50 MG tablet, Take 50 mg by mouth daily., Disp: , Rfl:  .  Acetaminophen 500 MG coapsule, Take 500 mg by mouth every 4 (four) hours as needed. Reported on 11/19/2015, Disp: , Rfl:  .  metoprolol succinate (TOPROL-XL) 50 MG 24 hr tablet, Take 75 mg (1.5 tabs) daily. (Patient taking differently: Take 50 mg by mouth daily. Take 75 mg (1.5 tabs) daily.), Disp: 45 tablet, Rfl: 6 .  metroNIDAZOLE (FLAGYL) 500 MG tablet, Take 1 tablet (500 mg total) by mouth 3 (three) times daily., Disp: 90 tablet, Rfl: 1 .  simvastatin (ZOCOR) 40 MG tablet, Take 1 tablet (40 mg total) by mouth at bedtime. (Patient not taking: Reported  on 11/19/2015), Disp: 30 tablet, Rfl: 6     Review of Systems  Constitutional: Negative for fever, chills, diaphoresis, activity change, appetite change, fatigue and unexpected weight change.  HENT: Negative for congestion, rhinorrhea, sinus pressure, sneezing, sore throat and trouble swallowing.   Eyes: Negative for photophobia and visual disturbance.  Respiratory: Negative for cough, chest tightness, shortness of breath, wheezing and stridor.   Cardiovascular: Negative for chest pain, palpitations and leg swelling.  Gastrointestinal: Negative for nausea, vomiting, abdominal pain, diarrhea, constipation, blood in stool, abdominal distention and anal bleeding.  Genitourinary: Negative for dysuria, hematuria, flank pain and difficulty urinating.  Musculoskeletal: Negative for myalgias, back pain, joint  swelling, arthralgias and gait problem.  Skin: Positive for wound. Negative for color change, pallor and rash.  Neurological: Negative for dizziness, tremors, weakness and light-headedness.  Hematological: Negative for adenopathy. Does not bruise/bleed easily.  Psychiatric/Behavioral: Negative for behavioral problems, confusion, sleep disturbance, dysphoric mood, decreased concentration and agitation.       Objective:   Physical Exam  Constitutional: He is oriented to person, place, and time. He appears well-developed and well-nourished.  HENT:  Head: Normocephalic and atraumatic.  Eyes: Conjunctivae and EOM are normal.  Neck: Normal range of motion. Neck supple.  Cardiovascular: Normal rate and regular rhythm.   Pulmonary/Chest: Effort normal. No respiratory distress. He has no wheezes.  Abdominal: Soft. He exhibits no distension.  Musculoskeletal: Normal range of motion. He exhibits no edema or tenderness.  Neurological: He is alert and oriented to person, place, and time.  Skin: Skin is warm and dry. No rash noted. No erythema. No pallor.     Psychiatric: His mood appears anxious.            Assessment & Plan:   #1 Chronic wound with exposed fat and possible osteomyelitis with pseudomonas and serratia on culture  I think his cipro is certainly appropriate, highly bio-available and with excellent tissue penetration essentially equivalent to being on IV antibiotics.  He may indeed need further debridement. The larger issue is that if he indeed has osteomyelitis underlying this is may not be possible to cure this without an amputation.  We could try chronic suppressive therapy but this is not realistic especially with poor wound healing overlying this.   He could be lucky and the osteo might be an "overcall" but the fact that this was seen in the patient who has had a chronic open wound that has failed to resolve for more than a year   I will plan on seeing the patient in roughly 3 weeks while we extend his therapy to 8 weeks. We will add flagyl 500 mg tid to cover anaerobes.   I will check a baseline ESR and CRP today  He will continue to follow with Plastics and should have DM optimized along with nutrition

## 2015-11-20 LAB — SEDIMENTATION RATE: SED RATE: 23 mm/h — AB (ref 0–20)

## 2015-11-26 ENCOUNTER — Telehealth: Payer: Self-pay | Admitting: Gastroenterology

## 2015-11-26 NOTE — Telephone Encounter (Signed)
His follow up appointment is 12/29/15 with you. He was scoped by you in December and received a blood transfusion. Please review and advise. Thanks

## 2015-11-28 NOTE — Telephone Encounter (Signed)
I have left message for the patient to call back. Appointment save for him on 12/11/15

## 2015-11-28 NOTE — Telephone Encounter (Signed)
Please try to bring him in sooner for evaluation. Thanks

## 2015-12-01 NOTE — Telephone Encounter (Signed)
Diannia Ruder calls and leaves a message in my voicemail. Called to her cell phone. Left message about the date and time of the appointment, 12/11/15 at 3:00 pm, asking she call if this will not work for Mr Culleton.

## 2015-12-02 ENCOUNTER — Encounter: Payer: Self-pay | Admitting: Physician Assistant

## 2015-12-02 ENCOUNTER — Ambulatory Visit (INDEPENDENT_AMBULATORY_CARE_PROVIDER_SITE_OTHER): Payer: Managed Care, Other (non HMO) | Admitting: Physician Assistant

## 2015-12-02 VITALS — BP 96/60 | HR 100 | Ht 71.0 in | Wt 174.2 lb

## 2015-12-02 DIAGNOSIS — N182 Chronic kidney disease, stage 2 (mild): Secondary | ICD-10-CM | POA: Diagnosis not present

## 2015-12-02 DIAGNOSIS — M869 Osteomyelitis, unspecified: Secondary | ICD-10-CM

## 2015-12-02 DIAGNOSIS — I255 Ischemic cardiomyopathy: Secondary | ICD-10-CM

## 2015-12-02 DIAGNOSIS — I5022 Chronic systolic (congestive) heart failure: Secondary | ICD-10-CM

## 2015-12-02 DIAGNOSIS — I2510 Atherosclerotic heart disease of native coronary artery without angina pectoris: Secondary | ICD-10-CM | POA: Diagnosis not present

## 2015-12-02 DIAGNOSIS — D649 Anemia, unspecified: Secondary | ICD-10-CM

## 2015-12-02 NOTE — Progress Notes (Addendum)
Cardiology Office Note Date:  12/02/2015  Patient ID:  Vincent, Brown 1961-02-16, MRN [id number] PCP:  Dorothey Baseman, MD  Cardiologist:  Dr. Gala Romney (CHF clinic)   Chief Complaint: f/u hospitalization for anemia, chronic CHF  History of Present Illness: Vincent Brown is a 55 y.o. male with history of CAD s/p CABG 2012, chronic systolic CHF (unable to tolerate lisinopril/ARB due to dizziness), HTN, HLD, DM2, CKD (stage II), severe venous insufficiency with previous R leg ulcers requiring vein stripping, anemia, gastritis, RLE osteomyelitis who presents for post-hospital follow-up.  He was admitted in 10/2015 with complaints of cough and SOB. He had recent abnormal myoview in 09/2015 and was initially admitted for cath. However, his Hgb was found to be 7.5, most likely etiology of SOB. He received 2 U PRBCs and underwent EGD which showed antral gastritis. Protonix was added. His anemia panel was more suggestive of anemia of chronic disease. His R/LHC showed patent LIMA-LAD, and SVG-OM2, but occluded SVG-PDA; right heart pressures slightly elevated, otherwise no significant findings. Continued medical therapy was recommended. 2D echo 10/22/15: EF 15-20%, severe diffuse HK with regional variations, mild MR, mod LAE, mildly dilated RV with mildly reduced systolic function, mild RAE, PASP (prior EF 25% in 10/2014). He was found to have large RLE chronic wound and MRI was suggestive of osteomyelitis. Vascular was consulted and felt AKA was the best option but the patient was not willing to have this at this time. Plastic surgery was consulted and performed debridement/wound vac. This is since being followed by ID and plastics. Although EF has remained historically low, the patient has deferred ICD in the past.   At discharge his labs 10/23/15 showed Cr 1.19, Hgb 9.4. He has since been followed by his PCP with recurrent declining Hgb levels -> 8.4 on 1/2 and then 7.6 on 11/12/15. He says he could  tell his blood counts were going down again because he was feeling occasionally SOB again. He reports there are plans for possible capsule endoscopy next with GI. He also had repeat BMET on 11/12/15 which showed Na 139, K 5.5, BUN 33, Cr 1.07. He has not had any chest pain, dizziness, syncope, BRBPR, melena, hematemesis, or hematuria. He continues to decline ICD. He has f/u labs planned for tomorrow by PCP including CMET and CBC - he brought in his lab slip in case we wanted to add any requests.   Past Medical History  Diagnosis Date  . Coronary artery disease 2012    a. s/p CABG - LIMA - LAD, SVG-OM2, SVG-PDA (11/2010). b. Abnl nuc 09/2015 - R/LHC 10/2015  R/LHC showed patent LIMA-LAD, and SVG-OM2, but occluded SVG-PDA; right heart pressures slightly elevated, otherwise no significant findings.  . Diabetes mellitus 2003  . Hyperlipidemia   . Ischemic cardiomyopathy   . CKD (chronic kidney disease), stage II   . Shingles 2003  . Proliferative retinopathy 2015    treated with injection of Avastin.   . Chronic osteomyelitis of right tibia (HCC) 11/19/2015  . Pseudomonas aeruginosa infection 11/19/2015  . Serratia infection 11/19/2015  . Group B streptococcal infection 11/19/2015  . Bacteroides fragilis 11/19/2015  . Chronic systolic CHF (congestive heart failure) (HCC)     a. unable to tolerate lisinopril/ARB due to dizziness.   . Essential hypertension   . Venous insufficiency     a. severe venous insufficiency with previous R leg ulcers requiring vein stripping.  . Anemia     a. 10/2015: symptomatic; requiring transfusion, EGD  with antral gastritis. Anemia panel more suggestive of AOCD.  Marland Kitchen Gastritis     Past Surgical History  Procedure Laterality Date  . Varicose vein surgery  1995    right lower extremity  . Coronary artery bypass graft  11/05/2010  . Appendectomy  1971  . Refractive surgery  2009, 2015  . Colonoscopy  10/2012    Dr Silver Huguenin in [place]. mall sigmoid tics, small  internal hemorrhoids.  otherwise normal screening study.  no polyps, telangectasia...  . Esophagogastroduodenoscopy (egd) with propofol N/A 10/21/2015    Procedure: ESOPHAGOGASTRODUODENOSCOPY (EGD) WITH PROPOFOL;  Surgeon: Napoleon Form, MD;  Location: [facility];  Service: Endoscopy;  Laterality: N/A;  . Cardiac catheterization N/A 10/22/2015    Procedure: Right/Left Heart Cath and Coronary/Graft Angiography;  Surgeon: Dolores Patty, MD;  [facility];  Service: Cardiovascular;  Laterality: N/A;  . I&d extremity Right 10/24/2015    Procedure: IRRIGATION AND DEBRIDEMENT EXTREMITY;  Surgeon: Alena Bills Dillingham, DO;  Location: [facility];  Service: Plastics;  Laterality: Right;  . Application of a-cell of extremity Right 10/24/2015    Procedure: APPLICATION OF A-CELL AND PLACEMENT OF WOUND VAC;  Surgeon: Alena Bills Dillingham, DO;  Location: [facility];  Service: Plastics;  Laterality: Right;    Current Outpatient Prescriptions  Medication Sig Dispense Refill  . Acetaminophen 500 MG coapsule Take 500 mg by mouth every 4 (four) hours as needed. Reported on 11/19/2015    . ascorbic acid (VITAMIN C) 1000 MG tablet Take 1,000 mg by mouth 2 (two) times daily.    Marland Kitchen aspirin EC 81 MG tablet Take 1 tablet (81 mg total) by mouth daily. 90 tablet 3  . ciprofloxacin (CIPRO) 750 MG tablet Take 1 tablet (750 mg total) by mouth 2 (two) times daily. 60 tablet 1  . cyclobenzaprine (FLEXERIL) 10 MG tablet Take 10 mg by mouth at bedtime.    . furosemide (LASIX) 20 MG tablet Take 20 mg by mouth daily as needed for fluid or edema.    Marland Kitchen glimepiride (AMARYL) 4 MG tablet Take 4 mg by mouth 2 (two) times daily before a meal. Take two tablets by mouth daily.    . metoprolol succinate (TOPROL-XL) 50 MG 24 hr tablet Take 75 mg by mouth daily. Take with or immediately following a meal.    . metroNIDAZOLE (FLAGYL) 500 MG tablet Take 1 tablet (500 mg total) by mouth 3 (three) times daily. 90 tablet 1  .  Multiple Vitamin (MULTIVITAMIN) tablet Take 1 tablet by mouth daily.    . pantoprazole (PROTONIX) 40 MG tablet Take 1 tablet (40 mg total) by mouth daily. 30 tablet 5  . simvastatin (ZOCOR) 40 MG tablet Take 1 tablet (40 mg total) by mouth at bedtime. 30 tablet 6  . traMADol (ULTRAM) 50 MG tablet Take by mouth every 6 (six) hours as needed for moderate pain.    Marland Kitchen zinc gluconate 50 MG tablet Take 50 mg by mouth daily.     No current facility-administered medications for this visit.    Allergies:   Review of patient's allergies indicates no known allergies.   Social History:  The patient  reports that he has never smoked. He has never used smokeless tobacco. He reports that he does not drink alcohol or use illicit drugs.   Family History:  The patient's family history includes Hypertension in his father; Other in his father.  ROS:  Please see the history of present illness.    All  other systems are reviewed and otherwise negative.   PHYSICAL EXAM:  VS:  BP 96/60 mmHg  Pulse 100  Ht  (1.803 m)  Wt 174 lb 3.2 oz (79.017 kg)  BMI 24.31 kg/m2  SpO2 99% BMI: Body mass index is 24.31 kg/(m^2). Well nourished, well developed WM, in no acute distress. Wearing wound vac apparatus HEENT: normocephalic, atraumatic Neck: no JVD, carotid bruits or masses Cardiac:  normal S1, S2; RRR; no murmurs, rubs, or gallops Lungs:  clear to auscultation bilaterally, no wheezing, rhonchi or rales Abd: soft, nontender, no hepatomegaly, + BS MS: no deformity or atrophy Ext: no edema. Right groin cath site well healed without hematoma, ecchymosis, or bruit. Skin: warm and dry, no rash Neuro:  moves all extremities spontaneously, no focal abnormalities noted, follows commands Psych: euthymic mood, full affect  EKG: Not done today.   Recent Labs: 10/23/2015: BUN 18; Creatinine, Ser 1.19; Hemoglobin 9.4*; Platelets 205; Potassium 4.7; Sodium 136  No results found for requested labs within last 365 days.    CrCl cannot be calculated (Patient has no serum creatinine result on file.).   Wt Readings from Last 3 Encounters:  12/02/15 174 lb 3.2 oz (79.017 kg)  11/19/15 176 lb 7 oz (80.032 kg)  10/25/15 149 lb 8 oz (67.813 kg)     Other studies reviewed: Additional studies/records reviewed today include: summarized above  ASSESSMENT AND PLAN:  1. Chronic systolic CHF/ICM - he appears euvolemic on exam. Weight is up because he is wearing a wound vac. HR is borderline elevated and BP is slightly low in the context of recurrent anemia which is being managed by his PCP. See below regarding holding of spironolactone/repeat labs. If he has continued hyperkalemia, may need to consider holding ARB as well. He continues to decline ICD implantation at this time. Continue beta blocker.  2. CAD s/p CABG as above - recent cath as above. No chest pain. Cath site OK. I told him to let us know if GI recommends that he hold further aspirin. FOBT in the hospital was negative per GI note. 3. CKD stage II - baseline Cr previously reported at 1.4. Most recent Cr stable at 1.07, but with hyperkalemia on labwork from 11/12/15. I have recommended he discontinue his spironolactone until further notice. He is not sure he is actually taking this anymore. He has f/u CMET in AM per PCP and I have asked him to have them send [place] a copy of his results for our records. 4. Anemia - this is being followed closely by primary care as he has had continued downtrend since discharge. The patient reports he may be going for capsule endoscopy next. Further monitoring per PCP. 5. Osteomyelitis - has f/u with plastics and ID.  Disposition: F/u with Dr. Gala Romney in CHF Clinic in 6-8 weeks.  Current medicines are reviewed at length with the patient today.  The patient did not have any concerns regarding medicines.  Vincent Mohair PA-C 12/02/2015 3:44 PM     [facility] [address] [place] [address] [phone] (office)  [phone] (fax)

## 2015-12-02 NOTE — Patient Instructions (Addendum)
Medication Instructions:  Your physician has recommended you make the following change in your medication:  1.  STOP Spironolactone UNTIL FURTHER NOTICE   Labwork: PLEASE HAVE YOUR PRIMARY CARE PHYSICIAN FAX [place] YOUR LAB RESULTS FROM TOMORROW TO [phone]  Testing/Procedures: NONE ORDERED  Follow-Up: Your physician recommends that you schedule a follow-up appointment in: 6-8 WEEKS WITH DR. Gala Romney IN THE CHF CLINIC   Any Other Special Instructions Will Be Listed Below (If Applicable).     If you need a refill on your cardiac medications before your next appointment, please call your pharmacy.

## 2015-12-02 NOTE — Telephone Encounter (Signed)
Spoke with Vincent Brown. The patient is symptomatic with dizziness and shortness of breath, but he does not want to take time from work anymore than he has to. Offered several choices on some earlier appointments, but these were declined. He is having blood work done through his primary tomorrow. Patient entered on the wait list for [name] and all the PA's.

## 2015-12-07 LAB — AFB CULTURE WITH SMEAR (NOT AT ARMC): Acid Fast Smear: NONE SEEN

## 2015-12-09 ENCOUNTER — Encounter: Payer: Self-pay | Admitting: Physician Assistant

## 2015-12-10 ENCOUNTER — Ambulatory Visit (INDEPENDENT_AMBULATORY_CARE_PROVIDER_SITE_OTHER): Payer: Managed Care, Other (non HMO) | Admitting: Infectious Diseases

## 2015-12-10 ENCOUNTER — Encounter: Payer: Self-pay | Admitting: Infectious Diseases

## 2015-12-10 DIAGNOSIS — L089 Local infection of the skin and subcutaneous tissue, unspecified: Secondary | ICD-10-CM

## 2015-12-10 DIAGNOSIS — T148 Other injury of unspecified body region: Secondary | ICD-10-CM | POA: Diagnosis not present

## 2015-12-10 DIAGNOSIS — T148XXA Other injury of unspecified body region, initial encounter: Principal | ICD-10-CM

## 2015-12-10 MED ORDER — METRONIDAZOLE 500 MG PO TABS: 500.0000 mg | ORAL_TABLET | Freq: Three times a day (TID) | ORAL | Status: DC

## 2015-12-10 MED ORDER — CIPROFLOXACIN HCL 750 MG PO TABS: 750.0000 mg | ORAL_TABLET | Freq: Two times a day (BID) | ORAL | Status: DC

## 2015-12-10 NOTE — Progress Notes (Signed)
   Subjective:    Patient ID: Vincent Brown, male    DOB: November 10, 1960, 55 y.o.   MRN: [id number]  HPI 55 year old with DM, CAD, who sustained an injury to R his leg with a logging chain 55 more than a year ago.  He has been suffering from this chronic wound for 57 nearly a half year since fresh injury.He was being followed by Vascular Surgery in [place] and they had apparently obtained culture that grew group B streptococcus and also bacteroides species. In December 2016, he was admitted to Heart Failure team. (TTE EF 15-20%). During the admission, he was found to have a large amount of green drainage  with a foul odor. He was found to have full thickness wounds 2 X 1X. 2cm and 14 X 6 X.3cm.  MRI was performed that showed possible osteomyelitis with edema of the right tibia. He was considered for AKA.  He was taken to the OR by Dr. Ulice Bold on 10/24/15 with  1. Excisional debridement of right leg ulcer (7 x 15 x 2 cm) including skin, subcutaneous tissue and muscle.  2. Placement of Acell (powder 1 gm and sheet 10 x 15 cm) and VAC His op Cx grew Pseudomonas (pan-sens) and Serratia (R- ancef). He was d/c home with cipro.  He had f/u in ID on 1-18 and had flagyl added to this.   He has had f/u with plastics who feel that his wound is improving.  Is being considered for skin graft.  He continues to have a VAC Has had clear fluid draining.  Had difficulty with his anbx at first to determine how to balance with meals.   His course is also complicated by anemia.   Review of Systems  Constitutional: Negative for fever and chills.  Gastrointestinal: Negative for diarrhea and constipation.  Genitourinary: Negative for difficulty urinating.       Objective:   Physical Exam  Musculoskeletal: He exhibits edema.       Legs:         Assessment & Plan:

## 2015-12-10 NOTE — Assessment & Plan Note (Addendum)
He appears to be doing well on a limited exam de to dressings He will continue on his anbx til his wound has closed.  Appreciate partnering with Government social research officer.  He needs heart failure f/u as his wound will be difficult to close with so much edema anbx refilled.  I reiterated dietary restrictions with cipro Will see him back in 6 weeks.

## 2015-12-11 ENCOUNTER — Other Ambulatory Visit (INDEPENDENT_AMBULATORY_CARE_PROVIDER_SITE_OTHER): Payer: Managed Care, Other (non HMO)

## 2015-12-11 ENCOUNTER — Encounter: Payer: Self-pay | Admitting: Physician Assistant

## 2015-12-11 ENCOUNTER — Ambulatory Visit (INDEPENDENT_AMBULATORY_CARE_PROVIDER_SITE_OTHER): Payer: Managed Care, Other (non HMO) | Admitting: Physician Assistant

## 2015-12-11 VITALS — BP 100/60 | HR 90 | Ht 71.0 in | Wt 175.0 lb

## 2015-12-11 DIAGNOSIS — R195 Other fecal abnormalities: Secondary | ICD-10-CM

## 2015-12-11 DIAGNOSIS — D509 Iron deficiency anemia, unspecified: Secondary | ICD-10-CM

## 2015-12-11 LAB — CBC WITH DIFFERENTIAL/PLATELET
BASOS ABS: 0 10*3/uL (ref 0.0–0.1)
BASOS PCT: 0.4 % (ref 0.0–3.0)
EOS PCT: 1.1 % (ref 0.0–5.0)
Eosinophils Absolute: 0.1 10*3/uL (ref 0.0–0.7)
HEMATOCRIT: 28.8 % — AB (ref 39.0–52.0)
Hemoglobin: 8.7 g/dL — ABNORMAL LOW (ref 13.0–17.0)
LYMPHS ABS: 1.5 10*3/uL (ref 0.7–4.0)
LYMPHS PCT: 25.4 % (ref 12.0–46.0)
MCHC: 30.4 g/dL (ref 30.0–36.0)
MCV: 81.2 fl (ref 78.0–100.0)
MONOS PCT: 9 % (ref 3.0–12.0)
Monocytes Absolute: 0.5 10*3/uL (ref 0.1–1.0)
NEUTROS ABS: 3.9 10*3/uL (ref 1.4–7.7)
Neutrophils Relative %: 64.1 % (ref 43.0–77.0)
PLATELETS: 230 10*3/uL (ref 150.0–400.0)
RBC: 3.55 Mil/uL — ABNORMAL LOW (ref 4.22–5.81)
RDW: 21.5 % — ABNORMAL HIGH (ref 11.5–15.5)
WBC: 6.1 10*3/uL (ref 4.0–10.5)

## 2015-12-11 MED ORDER — INTEGRA PLUS PO CAPS: 1.0000 | ORAL_CAPSULE | Freq: Every day | ORAL | Status: DC

## 2015-12-11 NOTE — Patient Instructions (Signed)
Please go to the basement level to have your labs drawn.   We sent a prescription for Integra Plus iron supplements. [facility] [address] , [place]. You have been scheduled for a capsule Endoscopy. Dr. Lavon Paganini.   Date:  12-28-2015 at 8:00 am .  Our office, [address] [place].  3rd floor.

## 2015-12-11 NOTE — Progress Notes (Signed)
Patient ID: ADRIANN FRIEDLI, male   DOB: Dec 24, 1960, 55 y.o.   MRN: [id number]   Subjective:    Patient ID: Gemma Payor, male    DOB: 1961/04/05, 55 y.o.   MRN: [id number]  HPI  Traeh  Is a 55 year old white male recently known to Dr.  Scherry Ran  and seen in consultation with recent hospitalization. He had been hospitalized in December 2016 for exacerbation of congestive heart failure and also underwent evaluation for anemia with drifting hemoglobin. Patient had undergone colonoscopy in 2015 at the [facility] and reports this as a negative exam. With that hospitalization in December he had EGD which showed antral erosive gastritis mild. [place] At time of discharge hemoglobin was 9.4, on 11/03/2015 hemoglobin 8.4 and on 11/12/2015 hemoglobin 7.6. He has had iron studies done by his PCP , serum iron was 24. Patient reports that he did receive 2 units of blood while he was hospitalized in December, he has not had any transfusions since that time.  He was documented to be Hemoccult negative during hospitalization. Patient has multiple co-morbidities with history of severe congestive heart failure with EF of 15-20 percent, he has declined ICD in the past , history of coronary artery disease status post CABG 2012, hypertension, chronic kidney disease stage II he also has a chronic right lower extremity wound and history of osteomyelitis.   Capsule endoscopy was suggested as the next step if his hemoglobin did not remained stable.   He is not anticoagulated but does take  aspirin 81 mg daily. He is maintained on Protonix 40 mg daily.  Review of Systems Pertinent positive and negative review of systems were noted in the above HPI section.  All other review of systems was otherwise negative.  Outpatient Encounter Prescriptions as of 12/11/2015  Medication Sig  . Acetaminophen 500 MG coapsule Take 500 mg by mouth every 4 (four) hours as needed. Reported on 11/19/2015  . ascorbic acid (VITAMIN C) 1000  MG tablet Take 1,000 mg by mouth 2 (two) times daily.  Marland Kitchen aspirin EC 81 MG tablet Take 1 tablet (81 mg total) by mouth daily.  . ciprofloxacin (CIPRO) 750 MG tablet Take 1 tablet (750 mg total) by mouth 2 (two) times daily.  . cyclobenzaprine (FLEXERIL) 10 MG tablet Take 10 mg by mouth at bedtime.  . furosemide (LASIX) 20 MG tablet Take 20 mg by mouth daily as needed for fluid or edema.  Marland Kitchen glimepiride (AMARYL) 4 MG tablet Take 4 mg by mouth 2 (two) times daily before a meal. Take two tablets by mouth daily.  . metoprolol succinate (TOPROL-XL) 50 MG 24 hr tablet Take 75 mg by mouth daily. Take with or immediately following a meal.  . metroNIDAZOLE (FLAGYL) 500 MG tablet Take 1 tablet (500 mg total) by mouth 3 (three) times daily.  . Multiple Vitamin (MULTIVITAMIN) tablet Take 1 tablet by mouth daily.  . pantoprazole (PROTONIX) 40 MG tablet Take 1 tablet (40 mg total) by mouth daily.  . Pseudoephedrine HCl (SUDAFED 12 HOUR PO) Take by mouth as needed.  . simvastatin (ZOCOR) 40 MG tablet Take 1 tablet (40 mg total) by mouth at bedtime.  . traMADol (ULTRAM) 50 MG tablet Take by mouth every 6 (six) hours as needed for moderate pain.  Marland Kitchen zinc gluconate 50 MG tablet Take 50 mg by mouth daily.  Marland Kitchen FeFum-FePoly-FA-B Cmp-C-Biot (INTEGRA PLUS) CAPS Take 1 capsule by mouth daily.   No facility-administered encounter medications on file as of 12/11/2015.  No Known Allergies Patient Active Problem List   Diagnosis Date Noted  . Chronic osteomyelitis of right tibia (HCC) 11/19/2015  . Pseudomonas aeruginosa infection 11/19/2015  . Serratia infection 11/19/2015  . Group B streptococcal infection 11/19/2015  . Bacteroides fragilis 11/19/2015  . 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) 11/07/2015  . Symptomatic anemia   . Wound infection (HCC)   . Congestive heart failure (HCC)   . DOE (dyspnea on exertion)   . Abnormal finding on thallium stress test     . Anemia 10/20/2015  . Venous stasis ulcer (HCC) 08/26/2015  . Post-traumatic wound infection (HCC) 08/05/2015  . Pre-operative cardiovascular examination 10/09/2012  . Chronic systolic heart failure (HCC) 04/21/2011  . Coronary atherosclerosis of native coronary artery 04/21/2011  . Pure hyperglyceridemia 04/21/2011  . Hyperlipidemia LDL goal <70 04/21/2011   Social History   Social History  . Marital Status: Married    Spouse Name: N/A  . Number of Children: 2  . Years of Education: N/A   Occupational History  . Works at [employer]    Social History Main Topics  . Smoking status: Never Smoker   . Smokeless tobacco: Never Used  . Alcohol Use: No  . Drug Use: No  . Sexual Activity: Not on file   Other Topics Concern  . Not on file   Social History Narrative    Mr. Brookover family history includes Hypertension in his father; Other in his father. There is no history of Colon cancer.      Objective:    Filed Vitals:   12/11/15 1507  BP: 100/60  Pulse: 90    Physical Exam   Well-developed white male in no acute distress , blood pressure 100/60 pulse 90 height 5 foot 11 weight 175. HEENT; nontraumatic normocephalic EOMI PERRLA sclera anicteric, irregular regular rate and rhythm with S1-S2 , systolic murmur, Pulmonary; clear bilaterally, Abdomen; soft nontender ,nondistended, bowel sounds are active no palpable mass or hepatosplenomegaly, Rectal ;exam scant stool in the rectal vault which is  Manson Passey  and heme positive, Extremities ;patient has a wound VAC on a right lower extremity wound, Neuropsych ;mood and affect appropriate     Assessment & Plan:   #1 55 yo WM with fe deficiency anemia,drifting hgb, and today documented hem positive- Recent EGD with mild gastritis, reported negative Colonoscopy 2015 ([facility] ) Will r/o small bowel source for blood loss- occult lesion vs AVM s #2 CAD s/p CABG  #3 severe cardiomyopathy- EF 15-20% #4 HTN #5 CKD stage 2  #6  chronic RLE wound/hx osteomyelitis  Plan; repeat hgb today - if less than 7 needs transfused Start Integra plus one daily  Schedule for capsule endoscopy  Obtain copy of prior colonoscopy  If capsule unrevealing will need repeat colon (at hospital secondary to cardiomyopathy)   Amy Oswald Hillock PA-C 12/11/2015   Cc: Dorothey Baseman, MD

## 2015-12-14 NOTE — Progress Notes (Signed)
Reviewed and agree with documentation and assessment and plan. K. Veena Nandigam , MD   

## 2015-12-15 ENCOUNTER — Telehealth: Payer: Self-pay | Admitting: Physician Assistant

## 2015-12-15 NOTE — Telephone Encounter (Signed)
Per pt's wife- PCP is aware of hyperkalemia and is keeping an eye on it but wife states that their top priority is Hgb and HCT.

## 2015-12-15 NOTE — Telephone Encounter (Signed)
New message      Returning a call to Vincent Brown.  Pt is aware of low hgb/hct.  He has an appt set up this week on 12-18-15 with the GI specialist.  Pt thinks this is why [name] was calling.

## 2015-12-16 ENCOUNTER — Other Ambulatory Visit: Payer: Self-pay

## 2015-12-16 DIAGNOSIS — D62 Acute posthemorrhagic anemia: Secondary | ICD-10-CM

## 2015-12-18 ENCOUNTER — Ambulatory Visit (INDEPENDENT_AMBULATORY_CARE_PROVIDER_SITE_OTHER): Payer: Managed Care, Other (non HMO) | Admitting: Gastroenterology

## 2015-12-18 DIAGNOSIS — D500 Iron deficiency anemia secondary to blood loss (chronic): Secondary | ICD-10-CM

## 2015-12-18 NOTE — Progress Notes (Signed)
Patient here for capsule endoscopy. Tolerated procedure. Verbalizes understanding of written and verbal instructions. Capsule ID#[id number] lot [id number] Exp. 2016-12-11

## 2015-12-22 ENCOUNTER — Encounter: Payer: Self-pay | Admitting: Gastroenterology

## 2015-12-29 ENCOUNTER — Ambulatory Visit: Payer: Managed Care, Other (non HMO) | Admitting: Gastroenterology

## 2016-01-02 ENCOUNTER — Telehealth: Payer: Self-pay | Admitting: Gastroenterology

## 2016-01-02 ENCOUNTER — Other Ambulatory Visit (INDEPENDENT_AMBULATORY_CARE_PROVIDER_SITE_OTHER): Payer: Managed Care, Other (non HMO)

## 2016-01-02 DIAGNOSIS — D62 Acute posthemorrhagic anemia: Secondary | ICD-10-CM

## 2016-01-02 LAB — CBC WITH DIFFERENTIAL/PLATELET
BASOS ABS: 0 10*3/uL (ref 0.0–0.1)
Basophils Relative: 0.5 % (ref 0.0–3.0)
EOS ABS: 0 10*3/uL (ref 0.0–0.7)
EOS PCT: 0.4 % (ref 0.0–5.0)
HCT: 27.2 % — ABNORMAL LOW (ref 39.0–52.0)
Lymphocytes Relative: 26.6 % (ref 12.0–46.0)
Lymphs Abs: 1.5 10*3/uL (ref 0.7–4.0)
MCHC: 31.1 g/dL (ref 30.0–36.0)
MCV: 78.8 fl (ref 78.0–100.0)
MONO ABS: 0.5 10*3/uL (ref 0.1–1.0)
Monocytes Relative: 8.4 % (ref 3.0–12.0)
NEUTROS PCT: 64.1 % (ref 43.0–77.0)
Neutro Abs: 3.5 10*3/uL (ref 1.4–7.7)
Platelets: 190 10*3/uL (ref 150.0–400.0)
RBC: 3.45 Mil/uL — AB (ref 4.22–5.81)
RDW: 19.2 % — ABNORMAL HIGH (ref 11.5–15.5)
WBC: 5.5 10*3/uL (ref 4.0–10.5)

## 2016-01-02 NOTE — Telephone Encounter (Signed)
I have left message for the patient to call back 

## 2016-01-06 NOTE — Telephone Encounter (Signed)
The patient is aware his hgb is stable, but he is upset that he is not "better" and he is "walking around feeling half dead." He would like to see a hematologist. Please advise.

## 2016-01-07 ENCOUNTER — Other Ambulatory Visit: Payer: Self-pay

## 2016-01-07 DIAGNOSIS — D509 Iron deficiency anemia, unspecified: Secondary | ICD-10-CM

## 2016-01-07 NOTE — Telephone Encounter (Signed)
Patient was scheduled to see [name] 12/29/15 but he rescheduled that because he didn't want to miss time from his work. That is understandable. Unfortunately the time of the day he wanted pushed the appointment out to 02/23/16. I have sent in the referral to Hematology. I left the patient a message advising him of this.

## 2016-01-07 NOTE — Telephone Encounter (Signed)
You can refer him to hematology for chronic anemia/fe deficiency , but stool was heme + and he probably should have Colonoscopy- have him see [name] in  follow up also,

## 2016-01-08 NOTE — Telephone Encounter (Signed)
ok 

## 2016-01-09 ENCOUNTER — Telehealth: Payer: Self-pay | Admitting: Hematology

## 2016-01-09 NOTE — Telephone Encounter (Signed)
Called pt left vm in ref to np appt. °

## 2016-01-12 ENCOUNTER — Telehealth: Payer: Self-pay | Admitting: Hematology and Oncology

## 2016-01-12 NOTE — Telephone Encounter (Signed)
Called [facility] verified Insurance is correct, no referral needed. Ref # [id number]. Completed Intake process.

## 2016-01-12 NOTE — Telephone Encounter (Signed)
Pt's wife is aware of appt on 01/21/16@3 :15 Referring [facility] Dx-Anemia- Iron Deficiency

## 2016-01-21 ENCOUNTER — Other Ambulatory Visit (HOSPITAL_BASED_OUTPATIENT_CLINIC_OR_DEPARTMENT_OTHER): Payer: Managed Care, Other (non HMO)

## 2016-01-21 ENCOUNTER — Ambulatory Visit (HOSPITAL_BASED_OUTPATIENT_CLINIC_OR_DEPARTMENT_OTHER): Payer: Managed Care, Other (non HMO) | Admitting: Hematology and Oncology

## 2016-01-21 ENCOUNTER — Telehealth: Payer: Self-pay | Admitting: Hematology and Oncology

## 2016-01-21 ENCOUNTER — Encounter: Payer: Self-pay | Admitting: Hematology and Oncology

## 2016-01-21 VITALS — BP 88/63 | HR 110 | Temp 97.9°F | Resp 17 | Wt 175.3 lb

## 2016-01-21 DIAGNOSIS — D649 Anemia, unspecified: Secondary | ICD-10-CM | POA: Diagnosis not present

## 2016-01-21 LAB — CBC & DIFF AND RETIC
BASO%: 0.6 % (ref 0.0–2.0)
BASOS ABS: 0 10*3/uL (ref 0.0–0.1)
EOS ABS: 0 10*3/uL (ref 0.0–0.5)
EOS%: 0.2 % (ref 0.0–7.0)
HEMATOCRIT: 27.5 % — AB (ref 38.4–49.9)
HEMOGLOBIN: 8 g/dL — AB (ref 13.0–17.1)
Immature Retic Fract: 18 % — ABNORMAL HIGH (ref 3.00–10.60)
LYMPH%: 32.6 % (ref 14.0–49.0)
MCH: 23.7 pg — AB (ref 27.2–33.4)
MCHC: 29.1 g/dL — AB (ref 32.0–36.0)
MCV: 81.4 fL (ref 79.3–98.0)
MONO#: 0.4 10*3/uL (ref 0.1–0.9)
MONO%: 7.8 % (ref 0.0–14.0)
NEUT#: 2.8 10*3/uL (ref 1.5–6.5)
NEUT%: 58.8 % (ref 39.0–75.0)
PLATELETS: 161 10*3/uL (ref 140–400)
RBC: 3.38 10*6/uL — ABNORMAL LOW (ref 4.20–5.82)
RDW: 17.5 % — ABNORMAL HIGH (ref 11.0–14.6)
RETIC %: 1.95 % — AB (ref 0.80–1.80)
Retic Ct Abs: 65.91 10*3/uL (ref 34.80–93.90)
WBC: 4.8 10*3/uL (ref 4.0–10.3)
lymph#: 1.6 10*3/uL (ref 0.9–3.3)

## 2016-01-21 LAB — COMPREHENSIVE METABOLIC PANEL
ALBUMIN: 3 g/dL — AB (ref 3.5–5.0)
ALK PHOS: 77 U/L (ref 40–150)
ALT: 10 U/L (ref 0–55)
ANION GAP: 10 meq/L (ref 3–11)
AST: 19 U/L (ref 5–34)
BUN: 31.8 mg/dL — AB (ref 7.0–26.0)
CALCIUM: 8.6 mg/dL (ref 8.4–10.4)
CHLORIDE: 96 meq/L — AB (ref 98–109)
CO2: 28 mEq/L (ref 22–29)
CREATININE: 1.5 mg/dL — AB (ref 0.7–1.3)
EGFR: 51 mL/min/{1.73_m2} — ABNORMAL LOW (ref >90)
Glucose: 201 mg/dl — ABNORMAL HIGH (ref 70–140)
POTASSIUM: 4.1 meq/L (ref 3.5–5.1)
Sodium: 134 mEq/L — ABNORMAL LOW (ref 136–145)
Total Bilirubin: 0.8 mg/dL (ref 0.20–1.20)
Total Protein: 7.8 g/dL (ref 6.4–8.3)

## 2016-01-21 NOTE — Assessment & Plan Note (Addendum)
Normocytic anemia: Although MCV is in the lower side of the normal at 78. Diagnosed December 2016 when patient was admitted for heart cath and was given 2 units of PRBC Nonhealing right leg ulcer with infection on antibiotics for the past 3 months with wound VAC  Differential diagnosis: 1. Anemia of chronic disease due to inflammation/infection 2. Iron and B-12 deficiencies 3. Hemolysis 4. Blood loss  Workup: CBC with differential, CMP, iron studies, ferritin, B-12, folic acid, erythropoietin level, reticulocyte, haptoglobin, LDH, direct Coombs.  Plan: 1. If the patient is iron deficient based on low iron saturation, we will plan to give him IV iron therapy 2. If he is B-12 folic acid deficient and we can supplement them. 3. If he is erythropoietin deficient, I will discuss with insurance about allowing [place] to use erythropoietin stimulation agents for anemia chronic disease.  I strongly suspect that this is anemia due to chronic inflammation and infection. Until infection gets better, he is unlikely to improve. We may need to give him supportive therapy with periodic blood transfusions as needed for hemoglobin less than 8.   Patient works at [employer] and still works full-time. 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

## 2016-01-21 NOTE — Telephone Encounter (Signed)
appt made and avs printed °

## 2016-01-21 NOTE — Progress Notes (Signed)
[facility] NOTE  Patient Care Team: Juluis Pitch, MD as PCP - General (Family Medicine) Robert Bellow, MD as Consulting Physician (General Surgery) Wallace Going, DO as Referring Physician (Plastic Surgery)  CHIEF COMPLAINTS/PURPOSE OF CONSULTATION:  Anemia for the past 3 months  HISTORY OF PRESENTING ILLNESS:  Vincent Brown 55 y.o. male is here because of recent diagnosis of anemia. Patient has a history of feeling fatigued and decreased energy for which he underwent a stress test because of his prior cardiac history. Because a stress test was abnormal, he went to the hospital for heart catheterization. This was in December 2016. He was admitted to the hospital because he was anemic. He was given 2 units of packed red cells. The heart cath didnot show any acute blockages. He has since remained anemic. He had an upper endoscopy and a capsule endoscopy which were both apparently normal. His last colonoscopy was 3 years ago. Initial Hemoccults were negative but later on Hemoccults came back positive.  He is here today accompanied by his wife who is a Marine scientist with employee health at a [employer]. 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  I reviewed her records extensively and collaborated the history with the patient.  MEDICAL HISTORY:  Past Medical History  Diagnosis Date  . Coronary artery disease 2012    a. s/p CABG - LIMA - LAD, SVG-OM2, SVG-PDA (11/2010). b. Abnl nuc 09/2015 - R/LHC 10/2015  R/LHC showed patent LIMA-LAD, and SVG-OM2, but occluded SVG-PDA; right heart pressures slightly elevated, otherwise no significant findings.  . Diabetes mellitus 2003  . Hyperlipidemia   . Ischemic cardiomyopathy   . CKD (chronic kidney disease), stage II   . Shingles 2003  . Proliferative retinopathy 2015    treated with injection of Avastin.   . Chronic osteomyelitis of right tibia ([facility]) 11/19/2015  . Pseudomonas aeruginosa  infection 11/19/2015  . Serratia infection 11/19/2015  . Group B streptococcal infection 11/19/2015  . Bacteroides fragilis 11/19/2015  . Chronic systolic CHF (congestive heart failure) (HCC)     a. unable to tolerate lisinopril/ARB due to dizziness.   . Essential hypertension   . Venous insufficiency     a. severe venous insufficiency with previous R leg ulcers requiring vein stripping.  . Anemia     a. 10/2015: symptomatic; requiring transfusion, EGD with antral gastritis. Anemia panel more suggestive of AOCD.  Marland Kitchen Gastritis     SURGICAL HISTORY: Past Surgical History  Procedure Laterality Date  . Varicose vein surgery  1995    right lower extremity  . Coronary artery bypass graft  11/05/2010  . Appendectomy  1971  . Refractive surgery  2009, 2015  . Colonoscopy  10/2012    Dr April Manson in [place]. mall sigmoid tics, small internal hemorrhoids.  otherwise normal screening study.  no polyps, telangectasia...  . Esophagogastroduodenoscopy (egd) with propofol N/A 10/21/2015    Procedure: ESOPHAGOGASTRODUODENOSCOPY (EGD) WITH PROPOFOL;  Surgeon: Mauri Pole, MD;  Location: [facility];  Service: Endoscopy;  Laterality: N/A;  . Cardiac catheterization N/A 10/22/2015    Procedure: Right/Left Heart Cath and Coronary/Graft Angiography;  Surgeon: Jolaine Artist, MD;  Location: [facility];  Service: Cardiovascular;  Laterality: N/A;  . I&d extremity Right 10/24/2015    Procedure: IRRIGATION AND DEBRIDEMENT EXTREMITY;  Surgeon: Loel Lofty Dillingham, DO;  Location: [facility];  Service: Plastics;  Laterality: Right;  . Application of a-cell of extremity Right  10/24/2015    Procedure: APPLICATION OF A-CELL AND PLACEMENT OF WOUND VAC;  Surgeon: Loel Lofty Dillingham, DO;  Location: [facility];  Service: Plastics;  Laterality: Right;    SOCIAL HISTORY: Social History   Social History  . Marital Status: Married    Spouse Name: N/A  . Number of Children: 2  . Years of Education: N/A    Occupational History  . Works at [employer].    Social History Main Topics  . Smoking status: Never Smoker   . Smokeless tobacco: Never Used  . Alcohol Use: No  . Drug Use: No  . Sexual Activity: Not on file   Other Topics Concern  . Not on file   Social History Narrative    FAMILY HISTORY: Family History  Problem Relation Age of Onset  . Hypertension Father   . Other Father     Pacemaker  . Colon cancer Neg Hx     ALLERGIES:  has No Known Allergies.  MEDICATIONS:  Current Outpatient Prescriptions  Medication Sig Dispense Refill  . Acetaminophen 500 MG coapsule Take 500 mg by mouth every 4 (four) hours as needed. Reported on 11/19/2015    . ascorbic acid (VITAMIN C) 1000 MG tablet Take 1,000 mg by mouth 2 (two) times daily.    Marland Kitchen aspirin EC 81 MG tablet Take 1 tablet (81 mg total) by mouth daily. 90 tablet 3  . ciprofloxacin (CIPRO) 750 MG tablet Take 1 tablet (750 mg total) by mouth 2 (two) times daily. 60 tablet 1  . cyclobenzaprine (FLEXERIL) 10 MG tablet Take 10 mg by mouth at bedtime.    . FeFum-FePoly-FA-B Cmp-C-Biot (INTEGRA PLUS) CAPS Take 1 capsule by mouth daily. 30 capsule 3  . furosemide (LASIX) 20 MG tablet Take 20 mg by mouth daily as needed for fluid or edema.    Marland Kitchen glimepiride (AMARYL) 4 MG tablet Take 4 mg by mouth 2 (two) times daily before a meal. Take two tablets by mouth daily.    . metoprolol succinate (TOPROL-XL) 50 MG 24 hr tablet Take 75 mg by mouth daily. Take with or immediately following a meal.    . metroNIDAZOLE (FLAGYL) 500 MG tablet Take 1 tablet (500 mg total) by mouth 3 (three) times daily. 90 tablet 1  . Multiple Vitamin (MULTIVITAMIN) tablet Take 1 tablet by mouth daily.    . pantoprazole (PROTONIX) 40 MG tablet Take 1 tablet (40 mg total) by mouth daily. 30 tablet 5  . Pseudoephedrine HCl (SUDAFED 12 HOUR PO) Take by mouth as needed.    . simvastatin (ZOCOR) 40 MG tablet Take 1 tablet (40 mg total) by mouth at bedtime. 30 tablet 6  .  traMADol (ULTRAM) 50 MG tablet Take by mouth every 6 (six) hours as needed for moderate pain.    Marland Kitchen zinc gluconate 50 MG tablet Take 50 mg by mouth daily.     No current facility-administered medications for this visit.    REVIEW OF SYSTEMS:   Constitutional: Denies fevers, chills or abnormal night sweats Eyes: Denies blurriness of vision, double vision or watery eyes Ears, nose, mouth, throat, and face: Denies mucositis or sore throat Respiratory: Intermittent cough Cardiovascular: Denies palpitation, chest discomfort or lower extremity swelling Gastrointestinal:  Denies nausea, heartburn or change in bowel habits Skin: Denies abnormal skin rashes Lymphatics: Denies new lymphadenopathy or easy bruising Neurological:Denies numbness, tingling or new weaknesses Behavioral/Psych: Mood is stable, no new changes  All other systems were reviewed with the patient and are negative.  PHYSICAL EXAMINATION: ECOG PERFORMANCE STATUS: 1 - Symptomatic but completely ambulatory  Filed Vitals:   01/21/16 1539  BP: 88/63  Pulse: 110  Temp: 97.9 F (36.6 C)  Resp: 17   Filed Weights   01/21/16 1539  Weight: 175 lb 4.8 oz (79.516 kg)    GENERAL:alert, no distress and comfortable SKIN: skin color, texture, turgor are normal, no rashes or significant lesions EYES: normal, conjunctiva are pink and non-injected, sclera clear OROPHARYNX:no exudate, no erythema and lips, buccal mucosa, and tongue normal  NECK: supple, thyroid normal size, non-tender, without nodularity LYMPH:  no palpable lymphadenopathy in the cervical, axillary or inguinal LUNGS: clear to auscultation and percussion with normal breathing effort HEART: regular rate & rhythm and no murmurs and no lower extremity edema ABDOMEN:abdomen soft, non-tender and normal bowel sounds Musculoskeletal:no cyanosis of digits and no clubbing  PSYCH: alert & oriented x 3 with fluent speech NEURO: no focal motor/sensory deficits  LABORATORY  DATA:  I have reviewed the data as listed Lab Results  Component Value Date   WBC 4.8 01/21/2016   HGB 8.0* 01/21/2016   HCT 27.5* 01/21/2016   MCV 81.4 01/21/2016   PLT 161 01/21/2016   Lab Results  Component Value Date   NA 136 10/23/2015   K 4.7 10/23/2015   CL 104 10/23/2015   CO2 25 10/23/2015   ASSESSMENT AND PLAN:  Normocytic anemia Normocytic anemia: Although MCV is in the lower side of the normal at 78. Diagnosed December 2016 when patient was admitted for heart cath and was given 2 units of PRBC Nonhealing right leg ulcer with infection on antibiotics for the past 3 months with wound VAC  Differential diagnosis: 1. Anemia of chronic disease due to inflammation/infection 2. Iron and B-12 deficiencies 3. Hemolysis 4. Blood loss  Workup: CBC with differential, CMP, iron studies, ferritin, Y-81, folic acid, erythropoietin level, reticulocyte, haptoglobin, LDH, direct Coombs.  Plan: 1. If the patient is iron deficient based on low iron saturation, we will plan to give him IV iron therapy 2. If he is K-48 folic acid deficient and we can supplement them. 3. If he is erythropoietin deficient, I will discuss with insurance about allowing [place] to use erythropoietin stimulation agents for anemia chronic disease. 4. If his infection gets better and he still remains anemic, we will consider doing a bone marrow biopsy.  I strongly suspect that this is anemia due to chronic inflammation and infection. Until infection gets better, he is unlikely to improve. We may need to give him supportive therapy with periodic blood transfusions as needed for hemoglobin less than 8.   Patient works at [employer] and still works full-time. 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 I will see him back in 1-2 months for follow-up of CBC.  All questions were answered. The patient knows to call the clinic with any problems, questions or concerns.    Rulon Eisenmenger, MD 01/21/2016

## 2016-01-22 ENCOUNTER — Other Ambulatory Visit: Payer: Self-pay | Admitting: Hematology and Oncology

## 2016-01-22 ENCOUNTER — Telehealth: Payer: Self-pay | Admitting: Hematology and Oncology

## 2016-01-22 DIAGNOSIS — D508 Other iron deficiency anemias: Secondary | ICD-10-CM | POA: Insufficient documentation

## 2016-01-22 LAB — DIRECT ANTIGLOBULIN TEST (NOT AT ARMC): COOMBS', DIRECT: NEGATIVE

## 2016-01-22 LAB — FOLATE: Folate: >20 ng/mL (ref >3.0)

## 2016-01-22 LAB — FERRITIN: Ferritin: 140 ng/ml (ref 22–316)

## 2016-01-22 LAB — LACTATE DEHYDROGENASE: LDH: 236 U/L (ref 125–245)

## 2016-01-22 LAB — ERYTHROPOIETIN: ERYTHROPOIETIN: 355.6 m[IU]/mL — AB (ref 2.6–18.5)

## 2016-01-22 LAB — IRON AND TIBC
%SAT: 7 % — ABNORMAL LOW (ref 20–55)
Iron: 21 ug/dL — ABNORMAL LOW (ref 42–163)
TIBC: 296 ug/dL (ref 202–409)
UIBC: 275 ug/dL (ref 117–376)

## 2016-01-22 LAB — HAPTOGLOBIN: HAPTOGLOBIN: 174 mg/dL (ref 34–200)

## 2016-01-22 LAB — VITAMIN B12: VITAMIN B 12: 537 pg/mL (ref 211–946)

## 2016-01-22 NOTE — Telephone Encounter (Signed)
Because the patient's wife on the telephone as requested yesterday by the patient. I reviewed the blood work which showed a hemoglobin of 8. Iron studies revealed an iron saturation 7% suggestive fine deficiency. I-29 folic acid erythropoietin level reticulocyte count haptoglobin LDH were all normal.  Recommendation: IV Feraheme 2 doses. Recheck his blood counts at the next scheduled appointment. If his hemoglobin does not improve, we will need to set him up for a bone marrow biopsy. Mrs. Slagel understands and will receive with the plan.

## 2016-01-22 NOTE — Telephone Encounter (Signed)
s.w pt wife with march an April appts

## 2016-01-23 ENCOUNTER — Other Ambulatory Visit (HOSPITAL_COMMUNITY): Payer: Self-pay | Admitting: *Deleted

## 2016-01-27 ENCOUNTER — Other Ambulatory Visit: Payer: Self-pay | Admitting: *Deleted

## 2016-01-27 ENCOUNTER — Ambulatory Visit (HOSPITAL_BASED_OUTPATIENT_CLINIC_OR_DEPARTMENT_OTHER): Payer: Managed Care, Other (non HMO)

## 2016-01-27 VITALS — BP 94/65 | HR 88 | Temp 97.6°F

## 2016-01-27 DIAGNOSIS — D649 Anemia, unspecified: Secondary | ICD-10-CM

## 2016-01-27 DIAGNOSIS — D508 Other iron deficiency anemias: Secondary | ICD-10-CM

## 2016-01-27 MED ORDER — SODIUM CHLORIDE 0.9 % IV SOLN
510.0000 mg | Freq: Once | INTRAVENOUS | Status: AC
  Administered 2016-01-27: 510 mg via INTRAVENOUS
  Filled 2016-01-27: qty 17

## 2016-01-27 MED ORDER — SODIUM CHLORIDE 0.9 % IV SOLN
Freq: Once | INTRAVENOUS | Status: AC
  Administered 2016-01-27: 15:00:00 via INTRAVENOUS

## 2016-01-27 NOTE — Patient Instructions (Signed)

## 2016-01-28 ENCOUNTER — Encounter (HOSPITAL_COMMUNITY): Payer: Self-pay | Admitting: *Deleted

## 2016-01-28 ENCOUNTER — Other Ambulatory Visit: Payer: Self-pay | Admitting: Plastic Surgery

## 2016-01-28 DIAGNOSIS — S81801D Unspecified open wound, right lower leg, subsequent encounter: Secondary | ICD-10-CM

## 2016-01-28 NOTE — Progress Notes (Signed)
Anesthesia Chart Review: SAME DAY WORK-UP.  Patient is a 55 year old male scheduled for RLE wound I&D with Acell and VAC tomorrow by Dr. Marla Roe. He underwent similar procedure on 10/24/15.  History includes non-smoker, CAD s/p CABG (LIMA-LAD, SVG-OM2, SVG-PDA) '12, ischemic cardiomyopathy (unable to tolerate lisinopril/ARB due to dizziness; patient deferred ICD), chronic systolic CHF, HTN, CKD stage II, HLD, venous insufficiency, RLE vein stripping '95, iron deficiency anemia, chronic osteomyelitis of the right tibia, appendectomy. Had abnormal myoview for evaluation of SOB in 09/2015. Admitted for cath but HGB was low at 7.5. He received 2 Units PRBC and underwent EGD which showed antral gastritis. Protonix added. Hemoccult was negative. Prior colonoscopy in 2015 was reported as negative. 12/2015 capsule endoscopy was negative. Cath ultimately done (see below) and continued medical therapy recommended. Anemia felt likely source of his SOB. Chronic (> 1 year) RLE wound appeared infected during that admission. Wound care and vascular surgery (Dr. Scot Dock) consulted. MRI was worrisome for osteomyelitis in the mid tibial shaft. Doppler studies showed adequate circulation for healing, however given the extent of the wound either right AKA or plastic surgery was recommended. Patient did not want to proceed with AKA so Dr. Marla Roe was consulted. I&D with wound VAC performed. ID has been following for positive cultures of Pseudomonas and Serratia treated with Cipro. He continues to have wound care and now needs the planned procedure.   PCP is Dr. Juluis Pitch [facility] Care Everywhere).  Cardiologist is Dr. Haroldine Laws. Last visit with Melina Copa, PA-C on 12/02/15. Patient continued to decline ICD implantation.  ID is Dr. Johnnye Sima. Hematologist is Dr. Lindi Adie. On 01/22/16, IV Feraheme X 2 doses recommended, and if no improvement then plan to arrange bone marrow biopsy. GI is Dr. Silverio Decamp, next  appointment scheduled for 02/23/16.  Meds list does not appear updated yet. Currently includes ASA 73m, Cipro, Flexeril, Integra Plus, Lasix, Amaryl, Toprol, Flagyl, Protonix, Sudafed, Zocor, Zinc, tramadol. Med list will have to be verified prior to surgery.  10/20/15 EKG: NSR, possible LAE, T wave abnormality, consider lateral ischemia, prolonged QT.  10/22/15 RHC/LHC (Dr. BHaroldine Laws: Conclusion:  2nd Mrg lesion, 100% stenosed.  SVG is and is anatomically normal.  LIMA is normal in caliber, and is anatomically normal.  SVG was injected .  There is severe focal disease in the graft.  Origin lesion, 100% stenosed.  Prox Cx to Mid Cx lesion, 50% stenosed.  Mid LAD lesion, 100% stenosed.  Prox LAD to Mid LAD lesion, 70% stenosed.  LM lesion, 70% stenosed.  Mid RCA lesion, 40% stenosed.  Dist RCA lesion, 100% stenosed. Findings: RA = 9 RV = 54/3/10 PA = 51/26 (35) PCW = 25 Ao = 113/42 (83) LV = 98/16/21 Fick cardiac output/index = 6.8/3.5 PVR = 1.5 WU SVR = 875 FA sat = 87% PA sat = 56%, 57% Assessment: 1. Severe 3v CAD as above with patent LIMA to LAD and patent SVG to OM 2. SVG to RPDA is occluded but there are good L->R and R->R collaterals 3. EF ~ 20% by echo and nuclear test 4. Mild to moderately elevated L-sided filling pressures with normal cardiac output Plan/Discussion: Continue medical therapy. Diureses gently post cath. Suspect m92of DAlbamay have been related to severe anemia.   10/22/15 Echo: Study Conclusions - Left ventricle: The cavity size was moderately dilated. Wall  thickness was normal. Systolic function was severely reduced. The  estimated ejection fraction was in the range of 15% to 20%.  Severe diffuse  hypokinesis with regional variations. Akinesis and  scarring of the entireinferior and inferoseptal myocardium;  consistent with infarction in the distribution of the right  coronary artery. Doppler parameters are consistent  with  restrictive physiology, indicative of decreased left ventricular  diastolic compliance and/or increased left atrial pressure. - Aortic valve: There was trivial regurgitation. - Mitral valve: There was mild regurgitation. - Left atrium: The atrium was moderately dilated. - Right ventricle: The cavity size was mildly dilated. Systolic  function was mildly reduced. - Right atrium: The atrium was mildly dilated. - Pulmonary arteries: Systolic pressure was mildly increased. PA  peak pressure: 35 mm Hg (S). (Prior EF 20% 11/05/10, 25-30% 01/20/12, 25% 03/14/13 and 10/14/14.)  (According to 04/21/11 notes by Dr. Haroldine Laws, "Had a NSTEMI in January 2012 after presenting with bad cough. Had cath at [facility]. LAD 100% LCX 70% RCA p70%, d100%. EF 25%. Cardiac MRI: EF 15% anterior wall and inferior wall infarct with significant viability.Had 3-v CABG on 11/05/10 with LIMA - LAD, SVG-OM2, SVG-PDA. 1/12.")  Labs on 01/21/16 noted. Cr 1.5, up from 1.19 on 10/23/15. Glucose 201. H/H 8.0/27.5. PLT 161K.   Patient with known ischemic CM with EF < 30 % and refusal of ICD. He had cardiac cath and echo within the past four months and was seen by cardiology ~ 2 months ago. He is a same day work-up, so he will need careful evaluation on the day of surgery to ensure there is no concern for acute CV/CHF issues. He is followed by Dr. Haroldine Laws and [facility] could be contacted if any concerns. Anemia has also been an issue. Labs will be just over a week old, but with a mild increase in his Cr and further drop in his H/H I will go ahead and order a pre-operative CBC and BMET. Will order a T&S as well but would defer decision for transfusion to his surgeon and/or anesthesiologist.  Definitive plan will depend on exam findings and lab results tomorrow.  George Hugh [facility] Short Stay Center/Anesthesiology Phone [phone] 01/28/2016 5:31 PM

## 2016-01-28 NOTE — Progress Notes (Signed)
Pt spouse, Vincent Brown, completed SDW-pre-op call. Spouse made aware to have pt stop  taking vitamins, fish oil, Sudafed, and herbal medications. Do not take any NSAIDs ie: Ibuprofen, Advil, Naproxen. BC and Goody Powder or etc.; spouse stated that pt does not currently take Aspirin. Spouse made aware to have pt not take Glimepiride this evening/night and morning of procedure. Spouse made aware of diabetes protocol to check BS morning of procedure, interventions for BS <70 and > 220 and emergency contact number. Pt spouse verbalized understanding of all pre-op instructions. Anesthesia to review pt history ( see note).

## 2016-01-29 ENCOUNTER — Ambulatory Visit (HOSPITAL_COMMUNITY): Payer: Managed Care, Other (non HMO) | Admitting: Vascular Surgery

## 2016-01-29 ENCOUNTER — Encounter (HOSPITAL_COMMUNITY): Payer: Self-pay | Admitting: Plastic Surgery

## 2016-01-29 ENCOUNTER — Ambulatory Visit (HOSPITAL_COMMUNITY)
Admission: RE | Admit: 2016-01-29 | Discharge: 2016-01-29 | Disposition: A | Discharge status: Home / Self Care | Payer: Managed Care, Other (non HMO) | Source: Ambulatory Visit | Attending: Plastic Surgery | Admitting: Plastic Surgery

## 2016-01-29 ENCOUNTER — Encounter (HOSPITAL_COMMUNITY)
Admission: RE | Disposition: A | Discharge status: Home / Self Care | Payer: Self-pay | Source: Ambulatory Visit | Attending: Plastic Surgery

## 2016-01-29 DIAGNOSIS — E1122 Type 2 diabetes mellitus with diabetic chronic kidney disease: Secondary | ICD-10-CM | POA: Diagnosis not present

## 2016-01-29 DIAGNOSIS — I255 Ischemic cardiomyopathy: Secondary | ICD-10-CM | POA: Diagnosis not present

## 2016-01-29 DIAGNOSIS — I252 Old myocardial infarction: Secondary | ICD-10-CM | POA: Diagnosis not present

## 2016-01-29 DIAGNOSIS — I2510 Atherosclerotic heart disease of native coronary artery without angina pectoris: Secondary | ICD-10-CM | POA: Diagnosis not present

## 2016-01-29 DIAGNOSIS — N182 Chronic kidney disease, stage 2 (mild): Secondary | ICD-10-CM | POA: Diagnosis not present

## 2016-01-29 DIAGNOSIS — I5022 Chronic systolic (congestive) heart failure: Secondary | ICD-10-CM | POA: Insufficient documentation

## 2016-01-29 DIAGNOSIS — L97919 Non-pressure 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 lower leg with unspecified severity: Secondary | ICD-10-CM | POA: Diagnosis present

## 2016-01-29 DIAGNOSIS — Z955 Presence of coronary angioplasty implant and graft: Secondary | ICD-10-CM | POA: Diagnosis not present

## 2016-01-29 DIAGNOSIS — E785 Hyperlipidemia, unspecified: Secondary | ICD-10-CM | POA: Insufficient documentation

## 2016-01-29 DIAGNOSIS — Z79899 Other long term (current) drug therapy: Secondary | ICD-10-CM | POA: Diagnosis not present

## 2016-01-29 DIAGNOSIS — I130 Hypertensive heart and chronic kidney disease with heart failure and stage 1 through stage 4 chronic kidney disease, or unspecified chronic kidney disease: Secondary | ICD-10-CM | POA: Diagnosis not present

## 2016-01-29 DIAGNOSIS — S81801D Unspecified open wound, right lower leg, subsequent encounter: Secondary | ICD-10-CM

## 2016-01-29 DIAGNOSIS — Z7984 Long term (current) use of oral hypoglycemic drugs: Secondary | ICD-10-CM | POA: Insufficient documentation

## 2016-01-29 HISTORY — DX: Headache: R51

## 2016-01-29 HISTORY — PX: APPLICATION OF WOUND VAC: SHX5189

## 2016-01-29 HISTORY — DX: Acute myocardial infarction, unspecified: I21.9

## 2016-01-29 HISTORY — PX: APPLICATION OF A-CELL OF EXTREMITY: SHX6303

## 2016-01-29 HISTORY — DX: Headache, unspecified: R51.9

## 2016-01-29 HISTORY — PX: I & D EXTREMITY: SHX5045

## 2016-01-29 HISTORY — DX: Reserved for inherently not codable concepts without codable children: IMO0001

## 2016-01-29 LAB — BASIC METABOLIC PANEL
ANION GAP: 10 (ref 5–15)
BUN: 26 mg/dL — AB (ref 6–20)
CALCIUM: 8.4 mg/dL — AB (ref 8.9–10.3)
CO2: 26 mmol/L (ref 22–32)
Chloride: 98 mmol/L — ABNORMAL LOW (ref 101–111)
Creatinine, Ser: 1.31 mg/dL — ABNORMAL HIGH (ref 0.61–1.24)
GFR calc Af Amer: >60 mL/min (ref >60)
GFR, EST NON AFRICAN AMERICAN: 59 mL/min — AB (ref >60)
GLUCOSE: 75 mg/dL (ref 65–99)
POTASSIUM: 4 mmol/L (ref 3.5–5.1)
Sodium: 134 mmol/L — ABNORMAL LOW (ref 135–145)

## 2016-01-29 LAB — CBC
HEMATOCRIT: 28.4 % — AB (ref 39.0–52.0)
Hemoglobin: 8.7 g/dL — ABNORMAL LOW (ref 13.0–17.0)
MCH: 24.6 pg — AB (ref 26.0–34.0)
MCHC: 30.6 g/dL (ref 30.0–36.0)
MCV: 80.5 fL (ref 78.0–100.0)
PLATELETS: 192 10*3/uL (ref 150–400)
RBC: 3.53 MIL/uL — ABNORMAL LOW (ref 4.22–5.81)
RDW: 17.9 % — AB (ref 11.5–15.5)
WBC: 6.4 10*3/uL (ref 4.0–10.5)

## 2016-01-29 LAB — TYPE AND SCREEN
ABO/RH(D): O POS
Antibody Screen: NEGATIVE

## 2016-01-29 LAB — GLUCOSE, CAPILLARY
GLUCOSE-CAPILLARY: 92 mg/dL (ref 65–99)
GLUCOSE-CAPILLARY: 74 mg/dL (ref 65–99)
GLUCOSE-CAPILLARY: 103 mg/dL — AB (ref 65–99)
Glucose-Capillary: 56 mg/dL — ABNORMAL LOW (ref 65–99)

## 2016-01-29 SURGERY — IRRIGATION AND DEBRIDEMENT EXTREMITY
Anesthesia: General | Site: Leg Lower | Laterality: Right

## 2016-01-29 MED ORDER — ARTIFICIAL TEARS OP OINT
TOPICAL_OINTMENT | OPHTHALMIC | Status: AC
  Filled 2016-01-29: qty 3.5

## 2016-01-29 MED ORDER — MIDAZOLAM HCL 2 MG/2ML IJ SOLN
INTRAMUSCULAR | Status: AC
  Filled 2016-01-29: qty 2

## 2016-01-29 MED ORDER — DEXTROSE 50 % IV SOLN
25.0000 mL | Freq: Once | INTRAVENOUS | Status: AC
  Administered 2016-01-29: 25 mL via INTRAVENOUS
  Filled 2016-01-29: qty 50

## 2016-01-29 MED ORDER — OXYCODONE HCL 5 MG/5ML PO SOLN: 5.0000 mg | Freq: Once | ORAL | Status: DC | PRN

## 2016-01-29 MED ORDER — PHENYLEPHRINE HCL 10 MG/ML IJ SOLN
20.0000 mg | INTRAVENOUS | Status: DC | PRN
  Administered 2016-01-29: 75 ug/min via INTRAVENOUS

## 2016-01-29 MED ORDER — ACETAMINOPHEN 160 MG/5ML PO SOLN
325.0000 mg | ORAL | Status: DC | PRN
  Filled 2016-01-29: qty 20.3

## 2016-01-29 MED ORDER — FENTANYL CITRATE (PF) 100 MCG/2ML IJ SOLN: 25.0000 ug | INTRAMUSCULAR | Status: DC | PRN

## 2016-01-29 MED ORDER — CEFAZOLIN SODIUM-DEXTROSE 2-4 GM/100ML-% IV SOLN
2.0000 g | INTRAVENOUS | Status: AC
  Administered 2016-01-29: 2 g via INTRAVENOUS

## 2016-01-29 MED ORDER — FENTANYL CITRATE (PF) 250 MCG/5ML IJ SOLN
INTRAMUSCULAR | Status: AC
  Filled 2016-01-29: qty 5

## 2016-01-29 MED ORDER — SODIUM CHLORIDE 0.9 % IR SOLN
Status: DC | PRN
  Administered 2016-01-29: 1000 mL

## 2016-01-29 MED ORDER — ONDANSETRON HCL 4 MG/2ML IJ SOLN
INTRAMUSCULAR | Status: DC | PRN
  Administered 2016-01-29: 4 mg via INTRAVENOUS

## 2016-01-29 MED ORDER — LIDOCAINE HCL (CARDIAC) 20 MG/ML IV SOLN
INTRAVENOUS | Status: AC
  Filled 2016-01-29: qty 5

## 2016-01-29 MED ORDER — PHENYLEPHRINE HCL 10 MG/ML IJ SOLN
INTRAMUSCULAR | Status: DC | PRN
  Administered 2016-01-29 (×5): 80 ug via INTRAVENOUS

## 2016-01-29 MED ORDER — LACTATED RINGERS IV SOLN
INTRAVENOUS | Status: DC
  Administered 2016-01-29 (×2): via INTRAVENOUS

## 2016-01-29 MED ORDER — PROPOFOL 10 MG/ML IV BOLUS
INTRAVENOUS | Status: AC
  Filled 2016-01-29: qty 20

## 2016-01-29 MED ORDER — POLYMYXIN B SULFATE 500000 UNITS IJ SOLR
INTRAMUSCULAR | Status: DC | PRN
  Administered 2016-01-29: 500 mL

## 2016-01-29 MED ORDER — PROPOFOL 10 MG/ML IV BOLUS
INTRAVENOUS | Status: DC | PRN
  Administered 2016-01-29: 90 mg via INTRAVENOUS

## 2016-01-29 MED ORDER — FENTANYL CITRATE (PF) 100 MCG/2ML IJ SOLN
INTRAMUSCULAR | Status: DC | PRN
  Administered 2016-01-29 (×2): 25 ug via INTRAVENOUS

## 2016-01-29 MED ORDER — ONDANSETRON HCL 4 MG/2ML IJ SOLN
INTRAMUSCULAR | Status: AC
  Filled 2016-01-29: qty 2

## 2016-01-29 MED ORDER — DEXTROSE 50 % IV SOLN
INTRAVENOUS | Status: DC | PRN
  Administered 2016-01-29: 25 mL via INTRAVENOUS

## 2016-01-29 MED ORDER — LIDOCAINE HCL (CARDIAC) 20 MG/ML IV SOLN
INTRAVENOUS | Status: DC | PRN
  Administered 2016-01-29: 80 mg via INTRAVENOUS

## 2016-01-29 MED ORDER — MIDAZOLAM HCL 5 MG/5ML IJ SOLN
INTRAMUSCULAR | Status: DC | PRN
  Administered 2016-01-29: 2 mg via INTRAVENOUS

## 2016-01-29 MED ORDER — OXYCODONE HCL 5 MG PO TABS: 5.0000 mg | ORAL_TABLET | Freq: Once | ORAL | Status: DC | PRN

## 2016-01-29 MED ORDER — ACETAMINOPHEN 325 MG PO TABS: 325.0000 mg | ORAL_TABLET | ORAL | Status: DC | PRN

## 2016-01-29 MED ORDER — DEXTROSE 50 % IV SOLN
INTRAVENOUS | Status: AC
  Administered 2016-01-29: 25 mL via INTRAVENOUS
  Filled 2016-01-29: qty 50

## 2016-01-29 SURGICAL SUPPLY — 37 items
BANDAGE ACE 4X5 VEL STRL LF (GAUZE/BANDAGES/DRESSINGS) ×2 IMPLANT
BANDAGE ELASTIC 4 VELCRO ST LF (GAUZE/BANDAGES/DRESSINGS) IMPLANT
BNDG GAUZE ELAST 4 BULKY (GAUZE/BANDAGES/DRESSINGS) ×2 IMPLANT
CANISTER SUCTION 2500CC (MISCELLANEOUS) ×2 IMPLANT
CONT SPEC 4OZ CLIKSEAL STRL BL (MISCELLANEOUS) ×2 IMPLANT
COVER SURGICAL LIGHT HANDLE (MISCELLANEOUS) ×2 IMPLANT
DRAPE INCISE IOBAN 66X45 STRL (DRAPES) ×2 IMPLANT
DRAPE ORTHO SPLIT 77X108 STRL (DRAPES) ×2
DRAPE SURG ORHT 6 SPLT 77X108 (DRAPES) ×2 IMPLANT
DRESSING HYDROCOLLOID 4X4 (GAUZE/BANDAGES/DRESSINGS) ×2 IMPLANT
DRSG CUTIMED SORBACT 7X9 (GAUZE/BANDAGES/DRESSINGS) ×2 IMPLANT
DRSG VAC ATS MED SENSATRAC (GAUZE/BANDAGES/DRESSINGS) ×2 IMPLANT
ELECT CAUTERY BLADE 6.4 (BLADE) ×2 IMPLANT
ELECT REM PT RETURN 9FT ADLT (ELECTROSURGICAL) ×2
ELECTRODE REM PT RTRN 9FT ADLT (ELECTROSURGICAL) ×1 IMPLANT
GLOVE BIO SURGEON STRL SZ 6.5 (GLOVE) ×14 IMPLANT
GLOVE BIOGEL PI IND STRL 6.5 (GLOVE) ×2 IMPLANT
GLOVE BIOGEL PI INDICATOR 6.5 (GLOVE) ×2
GLOVE SURG SS PI 6.5 STRL IVOR (GLOVE) ×4 IMPLANT
GOWN STRL REUS W/ TWL LRG LVL3 (GOWN DISPOSABLE) ×4 IMPLANT
GOWN STRL REUS W/TWL LRG LVL3 (GOWN DISPOSABLE) ×4
KIT BASIN OR (CUSTOM PROCEDURE TRAY) ×2 IMPLANT
KIT ROOM TURNOVER OR (KITS) ×2 IMPLANT
MATRIX SURGICAL PSM 10X15CM (Tissue) ×2 IMPLANT
MICROMATRIX 1000MG (Tissue) ×4 IMPLANT
NS IRRIG 1000ML POUR BTL (IV SOLUTION) ×2 IMPLANT
PACK GENERAL/GYN (CUSTOM PROCEDURE TRAY) ×2 IMPLANT
PAD ARMBOARD 7.5X6 YLW CONV (MISCELLANEOUS) ×4 IMPLANT
SOLUTION PARTIC MCRMTRX 1000MG (Tissue) ×2 IMPLANT
SURGILUBE 2OZ TUBE FLIPTOP (MISCELLANEOUS) ×4 IMPLANT
SUT SILK 2 0 SH (SUTURE) ×2 IMPLANT
SUT SILK 4 0 PS 2 (SUTURE) ×2 IMPLANT
SUT VIC AB 5-0 PS2 18 (SUTURE) ×8 IMPLANT
SUT VICRYL 4-0 PS2 18IN ABS (SUTURE) ×2 IMPLANT
TOWEL OR 17X24 6PK STRL BLUE (TOWEL DISPOSABLE) ×2 IMPLANT
TOWEL OR 17X26 10 PK STRL BLUE (TOWEL DISPOSABLE) ×2 IMPLANT
UNDERPAD 30X30 INCONTINENT (UNDERPADS AND DIAPERS) ×2 IMPLANT

## 2016-01-29 NOTE — Op Note (Signed)
Operative Note   DATE OF OPERATION: 01/29/2016  LOCATION: [facility]  SURGICAL DIVISION: Plastic Surgery  PREOPERATIVE DIAGNOSES:  Right leg ulcer  POSTOPERATIVE DIAGNOSES:  same  PROCEDURE:   1. Excisional sharpe debridement of bone in right leg 2 cm and skin 15 cm length 2. Preparation of right leg ulcer (9 x 17 x 1 cm) for placement of Acell (10 x 15 cm sheet and 2 gm powder).   3. Placement of VAC  SURGEON: [employer], DO  ASSISTANT: Shawn Rayburn, PA  ANESTHESIA:  General.   COMPLICATIONS: None.   INDICATIONS FOR PROCEDURE:  The patient, Vincent Brown is a 55 y.o. male born on Jun 04, 1961, is here for treatment of a chronic right leg wound that the patient has been dealing with for the past year.  He underwent debridement and acell placement in the past and returns for further treatment. MRN: [id number]  CONSENT:  Informed consent was obtained directly from the patient. Risks, benefits and alternatives were fully discussed. Specific risks including but not limited to bleeding, infection, hematoma, seroma, scarring, pain, infection, contracture, asymmetry, wound healing problems, and need for further surgery were all discussed. The patient did have an ample opportunity to have questions answered to satisfaction.   DESCRIPTION OF PROCEDURE:  The patient was taken to the operating room. SCD was placed on the left leg and IV antibiotics were given. The patient's operative site was prepped and draped in a sterile fashion. A time out was performed and all information was confirmed to be correct.  General anesthesia was administered.  The wound of the right leg (9 x 17 cm) was irrigated with antibiotic and saline solution.  The skin was debrided sharply with a #10 blade and the curette used at the base of the wound.  There was hard areas that felt and looked like calcified bone at the proximal medial and distal medial area.  A ronguer was used to debride these areas (2  cm).  Hemostasis was achieved with electrocautery.  All of the Acell powder and sheet were placed and secured with 5-0 Vicryl.  A sorbact was placed on top and secured with a 4-0 Silk.  The KY and VAC were placed and the [facility] ad an excellent seal.  The leg was wrapped with a kerlex and Ace. The patient tolerated the procedure well.  There were no complications. The patient was allowed to wake from anesthesia, extubated and taken to the recovery room in satisfactory condition.

## 2016-01-29 NOTE — Discharge Instructions (Signed)
May change VAC next week. VAC change weekly

## 2016-01-29 NOTE — H&P (Signed)
Vincent Brown is an 55 y.o. male.   Chief Complaint: right leg wound HPI: The patient is a 55 yrs old wm here for further care of his right leg wound. He was injured over a year ago while using a chainsaw. He was treating himself at home and was recently seen in the wound care center. He was admitted to [facility] with concerns for osteomyelitis and infection. We took him to the OR for debridement and Acell / VAC placement. He is being treated with antibiotics from ID as the cultures were positive. The Acell is incorporating well. The VAC is being changed at home. There is swelling and slight redness but it is improving. There is no visible bone. It is slowly granulating.   Past Medical History  Diagnosis Date  . Coronary artery disease 2012    a. s/p CABG - LIMA - LAD, SVG-OM2, SVG-PDA (11/2010). b. Abnl nuc 09/2015 - R/LHC 10/2015  R/LHC showed patent LIMA-LAD, and SVG-OM2, but occluded SVG-PDA; right heart pressures slightly elevated, otherwise no significant findings.  . Diabetes mellitus 2003  . Hyperlipidemia   . Ischemic cardiomyopathy   . CKD (chronic kidney disease), stage II   . Shingles 2003  . Proliferative retinopathy 2015    treated with injection of Avastin.   . Chronic osteomyelitis of right tibia (HCC) 11/19/2015  . Pseudomonas aeruginosa infection 11/19/2015  . Serratia infection 11/19/2015  . Group B streptococcal infection 11/19/2015  . Bacteroides fragilis 11/19/2015  . Chronic systolic CHF (congestive heart failure) (HCC)     a. unable to tolerate lisinopril/ARB due to dizziness.   . Essential hypertension   . Venous insufficiency     a. severe venous insufficiency with previous R leg ulcers requiring vein stripping.  . Anemia     a. 10/2015: symptomatic; requiring transfusion, EGD with antral gastritis. Anemia panel more suggestive of AOCD.  Marland Kitchen Gastritis   . Myocardial infarction (HCC)   . Shortness of breath dyspnea   . Headache     Past Surgical History  Procedure  Laterality Date  . Varicose vein surgery  1995    right lower extremity  . Coronary artery bypass graft  11/05/2010  . Appendectomy  1971  . Refractive surgery  2009, 2015  . Colonoscopy  10/2012    Dr Silver Huguenin in [place]. mall sigmoid tics, small internal hemorrhoids.  otherwise normal screening study.  no polyps, telangectasia...  . Esophagogastroduodenoscopy (egd) with propofol N/A 10/21/2015    Procedure: ESOPHAGOGASTRODUODENOSCOPY (EGD) WITH PROPOFOL;  Surgeon: Napoleon Form, MD;  Location: [facility] ENDOSCOPY;  Service: Endoscopy;  Laterality: N/A;  . Cardiac catheterization N/A 10/22/2015    Procedure: Right/Left Heart Cath and Coronary/Graft Angiography;  Surgeon: Dolores Patty, MD;  Location: [facility] INVASIVE CV LAB;  Service: Cardiovascular;  Laterality: N/A;  . I&d extremity Right 10/24/2015    Procedure: IRRIGATION AND DEBRIDEMENT EXTREMITY;  Surgeon: Alena Bills Lygia Olaes, DO;  Location: [facility] OR;  Service: Plastics;  Laterality: Right;  . Application of a-cell of extremity Right 10/24/2015    Procedure: APPLICATION OF A-CELL AND PLACEMENT OF WOUND VAC;  Surgeon: Alena Bills Shaynah Hund, DO;  Location: [facility] OR;  Service: Plastics;  Laterality: Right;  . Fracture surgery       wrist    Family History  Problem Relation Age of Onset  . Hypertension Father   . Other Father     Pacemaker  . Colon cancer Neg Hx    Social History:  reports that he has  never smoked. He has never used smokeless tobacco. He reports that he does not drink alcohol or use illicit drugs.  Allergies: No Known Allergies  Medications Prior to Admission  Medication Sig Dispense Refill  . Acetaminophen 500 MG coapsule Take 500 mg by mouth every 4 (four) hours as needed for pain. Reported on 11/19/2015    . ascorbic acid (VITAMIN C) 1000 MG tablet Take 1,000 mg by mouth 2 (two) times daily.    . ciprofloxacin (CIPRO) 750 MG tablet Take 1 tablet (750 mg total) by mouth 2 (two) times daily. 60 tablet 1  .  cyclobenzaprine (FLEXERIL) 10 MG tablet Take 10 mg by mouth at bedtime as needed for muscle spasms.     . furosemide (LASIX) 20 MG tablet Take 20 mg by mouth daily as needed for fluid or edema.    Marland Kitchen glimepiride (AMARYL) 4 MG tablet Take 4 mg by mouth 2 (two) times daily before a meal. Take two tablets by mouth daily.    . metoprolol succinate (TOPROL-XL) 50 MG 24 hr tablet Take 75 mg by mouth daily. Take with or immediately following a meal.    . metroNIDAZOLE (FLAGYL) 500 MG tablet Take 1 tablet (500 mg total) by mouth 3 (three) times daily. 90 tablet 1  . Multiple Vitamin (MULTIVITAMIN) tablet Take 1 tablet by mouth daily.    . traMADol (ULTRAM) 50 MG tablet Take 50 mg by mouth every 6 (six) hours as needed for moderate pain.     Marland Kitchen zinc gluconate 50 MG tablet Take 50 mg by mouth daily.    Marland Kitchen FeFum-FePoly-FA-B Cmp-C-Biot (INTEGRA PLUS) CAPS Take 1 capsule by mouth daily. (Patient not taking: Reported on 01/28/2016) 30 capsule 3  . pantoprazole (PROTONIX) 40 MG tablet Take 1 tablet (40 mg total) by mouth daily. (Patient not taking: Reported on 01/28/2016) 30 tablet 5  . simvastatin (ZOCOR) 40 MG tablet Take 1 tablet (40 mg total) by mouth at bedtime. (Patient not taking: Reported on 01/28/2016) 30 tablet 6    No results found for this or any previous visit (from the past 48 hour(s)). No results found.  Review of Systems  Constitutional: Negative.   HENT: Negative.   Eyes: Negative.   Respiratory: Negative.   Cardiovascular: Negative.   Gastrointestinal: Negative.   Genitourinary: Negative.   Musculoskeletal: Negative.   Skin: Negative.   Psychiatric/Behavioral: Negative.     There were no vitals taken for this visit. Physical Exam  Constitutional: He is oriented to person, place, and time. He appears well-developed and well-nourished.  HENT:  Head: Normocephalic and atraumatic.  Eyes: Conjunctivae and EOM are normal. Pupils are equal, round, and reactive to light.  Cardiovascular:  Normal rate.   Respiratory: Effort normal.  GI: Soft. He exhibits no distension.  Neurological: He is alert and oriented to person, place, and time.  Psychiatric: He has a normal mood and affect. His behavior is normal. Judgment and thought content normal.     Assessment/Plan Debridement of right leg wound with possible Acell and VAC placement.  Peggye Form, DO 01/29/2016, 7:13 AM

## 2016-01-29 NOTE — Transfer of Care (Signed)
Immediate Anesthesia Transfer of Care Note  Patient: Vincent Brown  Procedure(s) Performed: Procedure(s): RIGHT LEG WOUND IRRIGATION AND DEBRIDEMENT  (Right) APPLICATION OF A-CELL OF EXTREMITY (Right) APPLICATION OF WOUND VAC (Right)  Patient Location: PACU  Anesthesia Type:General  Level of Consciousness: awake and alert   Airway & Oxygen Therapy: Patient Spontanous Breathing and Patient connected to nasal cannula oxygen  Post-op Assessment: Report given to RN and Post -op Vital signs reviewed and stable  Post vital signs: Reviewed and stable  Last Vitals:  Filed Vitals:   01/29/16 0745 01/29/16 1039  BP: 90/65   Pulse: 89   Temp: 37.1 C 36.6 C  Resp: 18     Complications: No apparent anesthesia complications

## 2016-01-29 NOTE — Brief Op Note (Signed)
01/29/2016  10:32 AM  PATIENT:  Vincent Brown  55 y.o. male  PRE-OPERATIVE DIAGNOSIS:  Right Leg Wound   POST-OPERATIVE DIAGNOSIS:  Right Leg Wound   PROCEDURE:  Procedure(s): RIGHT LEG WOUND IRRIGATION AND DEBRIDEMENT  (Right) APPLICATION OF A-CELL OF EXTREMITY (Right) APPLICATION OF WOUND VAC (Right)  SURGEON:  Surgeon(s) and Role:    * Alena Bills Leenah Seidner, DO - Primary  PHYSICIAN ASSISTANT: Shawn Rayburn, PA  ASSISTANTS: none   ANESTHESIA:   general  EBL:     BLOOD ADMINISTERED:none  DRAINS: none   LOCAL MEDICATIONS USED:  NONE  SPECIMEN:  No Specimen  DISPOSITION OF SPECIMEN:  N/A  COUNTS:  YES  TOURNIQUET:  * No tourniquets in log *  DICTATION: .Dragon Dictation  PLAN OF CARE: Discharge to home after PACU  PATIENT DISPOSITION:  PACU - hemodynamically stable.   Delay start of Pharmacological VTE agent (>24hrs) due to surgical blood loss or risk of bleeding: no

## 2016-01-29 NOTE — Anesthesia Preprocedure Evaluation (Addendum)
Anesthesia Evaluation  Patient identified by MRN, date of birth, ID band Patient awake    Reviewed: Allergy & Precautions, NPO status , Patient's Chart, lab work & pertinent test results  History of Anesthesia Complications Negative for: history of anesthetic complications  Airway Mallampati: II  TM Distance: >3 FB Neck ROM: Full    Dental no notable dental hx.    Pulmonary shortness of breath and with exertion,    breath sounds clear to auscultation       Cardiovascular hypertension, + CAD, + Past MI, + Peripheral Vascular Disease, +CHF and + DOE   Rhythm:Regular     Neuro/Psych  Headaches,    GI/Hepatic negative GI ROS, Neg liver ROS,   Endo/Other  diabetes, Type 2, Oral Hypoglycemic AgentsCBG 0815 92  Renal/GU Renal InsufficiencyRenal disease     Musculoskeletal   Abdominal   Peds  Hematology  (+) anemia , 3/30 0734 Hb 8.7 Hct 28.4    Anesthesia Other Findings   Reproductive/Obstetrics                            Anesthesia Physical Anesthesia Plan  ASA: III  Anesthesia Plan: General   Post-op Pain Management:    Induction: Intravenous  Airway Management Planned: LMA  Additional Equipment: None  Intra-op Plan:   Post-operative Plan: Extubation in OR  Informed Consent: I have reviewed the patients History and Physical, chart, labs and discussed the procedure including the risks, benefits and alternatives for the proposed anesthesia with the patient or authorized representative who has indicated his/her understanding and acceptance.   Dental advisory given  Plan Discussed with: CRNA and Surgeon  Anesthesia Plan Comments:                                          Anesthesia Evaluation  Patient identified by MRN, date of birth, ID band Patient awake    Reviewed: Allergy & Precautions, H&P , NPO status , Patient's Chart, lab work & pertinent test results, reviewed  documented beta blocker date and time   Airway Mallampati: I  TM Distance: >3 FB Neck ROM: Full    Dental no notable dental hx. (+) Teeth Intact, Dental Advisory Given   Pulmonary neg pulmonary ROS,    Pulmonary exam normal breath sounds clear to auscultation       Cardiovascular + CAD, + CABG and +CHF   Rhythm:Regular Rate:Normal     Neuro/Psych negative neurological ROS  negative psych ROS   GI/Hepatic negative GI ROS, Neg liver ROS,   Endo/Other  diabetes, Type 2, Oral Hypoglycemic Agents  Renal/GU Renal InsufficiencyRenal disease  negative genitourinary   Musculoskeletal   Abdominal   Peds  Hematology negative hematology ROS (+)   Anesthesia Other Findings   Reproductive/Obstetrics negative OB ROS                            Anesthesia Physical Anesthesia Plan  ASA: III  Anesthesia Plan: General   Post-op Pain Management:    Induction: Intravenous  Airway Management Planned: LMA  Additional Equipment:   Intra-op Plan:   Post-operative Plan: Extubation in OR  Informed Consent: I have reviewed the patients History and Physical, chart, labs and discussed the procedure including the risks, benefits and alternatives for the proposed anesthesia with the patient  or authorized representative who has indicated his/her understanding and acceptance.   Dental advisory given  Plan Discussed with: CRNA  Anesthesia Plan Comments:        Anesthesia Quick Evaluation  Anesthesia Quick Evaluation

## 2016-01-29 NOTE — Anesthesia Procedure Notes (Signed)
Procedure Name: LMA Insertion Date/Time: 01/29/2016 9:32 AM Performed by: Margaree Mackintosh Pre-anesthesia Checklist: Patient identified, Emergency Drugs available, Suction available, Patient being monitored and Timeout performed Patient Re-evaluated:Patient Re-evaluated prior to inductionOxygen Delivery Method: Circle system utilized Preoxygenation: Pre-oxygenation with 100% oxygen Intubation Type: IV induction LMA: LMA inserted LMA Size: 5.0 Number of attempts: 1 Placement Confirmation: positive ETCO2 and breath sounds checked- equal and bilateral Tube secured with: Tape Dental Injury: Teeth and Oropharynx as per pre-operative assessment

## 2016-01-30 ENCOUNTER — Encounter (HOSPITAL_COMMUNITY): Payer: Self-pay | Admitting: Plastic Surgery

## 2016-01-30 NOTE — Anesthesia Postprocedure Evaluation (Signed)
Anesthesia Post Note  Patient: Vincent Brown  Procedure(s) Performed: Procedure(s) (LRB): RIGHT LEG WOUND IRRIGATION AND DEBRIDEMENT  (Right) APPLICATION OF A-CELL OF EXTREMITY (Right) APPLICATION OF WOUND VAC (Right)  Patient location during evaluation: PACU Anesthesia Type: General Level of consciousness: awake Pain management: pain level controlled Vital Signs Assessment: post-procedure vital signs reviewed and stable Respiratory status: spontaneous breathing Cardiovascular status: stable Postop Assessment: no signs of nausea or vomiting Anesthetic complications: no    Last Vitals:  Filed Vitals:   01/29/16 1138 01/29/16 1141  BP: 96/70   Pulse: 78   Temp:  36.4 C  Resp: 14     Last Pain:  Filed Vitals:   01/29/16 1222  PainSc: 0-No pain                 Filip Luten

## 2016-02-03 ENCOUNTER — Ambulatory Visit (HOSPITAL_BASED_OUTPATIENT_CLINIC_OR_DEPARTMENT_OTHER): Payer: Managed Care, Other (non HMO)

## 2016-02-03 ENCOUNTER — Ambulatory Visit (HOSPITAL_BASED_OUTPATIENT_CLINIC_OR_DEPARTMENT_OTHER): Payer: Managed Care, Other (non HMO) | Admitting: Nurse Practitioner

## 2016-02-03 VITALS — BP 104/64 | HR 109 | Temp 99.9°F | Resp 16

## 2016-02-03 DIAGNOSIS — R509 Fever, unspecified: Secondary | ICD-10-CM

## 2016-02-03 DIAGNOSIS — D649 Anemia, unspecified: Secondary | ICD-10-CM

## 2016-02-03 DIAGNOSIS — D509 Iron deficiency anemia, unspecified: Secondary | ICD-10-CM | POA: Diagnosis not present

## 2016-02-03 DIAGNOSIS — R609 Edema, unspecified: Secondary | ICD-10-CM | POA: Diagnosis not present

## 2016-02-03 DIAGNOSIS — D508 Other iron deficiency anemias: Secondary | ICD-10-CM

## 2016-02-03 MED ORDER — SODIUM CHLORIDE 0.9 % IV SOLN
510.0000 mg | Freq: Once | INTRAVENOUS | Status: AC
  Administered 2016-02-03: 510 mg via INTRAVENOUS
  Filled 2016-02-03: qty 17

## 2016-02-03 MED ORDER — SODIUM CHLORIDE 0.9 % IV SOLN
Freq: Once | INTRAVENOUS | Status: AC
  Administered 2016-02-03: 15:00:00 via INTRAVENOUS

## 2016-02-03 NOTE — Progress Notes (Signed)
1515 OK to treat prior to [facility] with HR 110 per Terri RN per Dr. Pamelia Hoit.

## 2016-02-03 NOTE — Patient Instructions (Signed)

## 2016-02-03 NOTE — Progress Notes (Signed)
Cyndee NP was called over at 1630 to evaluate pt prior to discharge for elevated temp and sustained tachycardia during visit.  He feels well. Leg wound was still wrapped post surgical procedure last week and pt took off his wound vac canister "just before I came in because it was dead off batteries". He has intermittent shivers due to "being cold" and he refused to go to ER for evaluation and told Cyndee he will take Tylenol at home and "have my wife check my temperature and she will take me to [facility] ED if anything happens".

## 2016-02-04 ENCOUNTER — Telehealth: Payer: Self-pay | Admitting: Nurse Practitioner

## 2016-02-04 ENCOUNTER — Encounter: Payer: Self-pay | Admitting: Nurse Practitioner

## 2016-02-04 DIAGNOSIS — R509 Fever, unspecified: Secondary | ICD-10-CM | POA: Insufficient documentation

## 2016-02-04 NOTE — Telephone Encounter (Signed)
Spoke with Larita Fife, RN with [facility]. Gwendolyn Lima of patient's symptoms yesterday concerning for impending sepsis. Larita Fife stated she is seeing patient this afternoon and will evaluate status and will call this office with update.

## 2016-02-04 NOTE — Assessment & Plan Note (Signed)
Patient has been diagnosed with normocytic anemia; most likely secondary to chronic infection/inflammation of his right lower leg.  Patient presented to the cancer Center today to receive his first Feraheme infusion.  Patient tolerated the iron infusion well; with no significant issues.  Patient is scheduled to return on 03/25/2016 for labs and a follow-up visit.

## 2016-02-04 NOTE — Assessment & Plan Note (Signed)
Patient presented [facility] the [facility] today to receive his first Feraheme infusion.  It was noted at the end of the Feraheme infusion that patient's heart rate had increased to 110.  Also, patient appeared flushed as well.  Patient had no new complaints whatsoever.  He denies any URI symptoms or UTI symptoms.  Blood pressure remained stable; but, temperature increased to 100.4.  It was also noted on exam that patient had some cyanosis to all of his fingertips as well.  After further discussion with the patient-patient stated that the only source of infection.  He has at this present time is the chronic left lower leg wound/ulcer.  This right lower leg wound has been managed by Dr. Kelly Splinter plastic surgeon; and he is currently wearing a wound VAC to the site.  Patient states that the battery was low to the wound VAC; so he removed the wound VAC just today.  He states that he has an advanced home health nurse come out to manage the wound VAC.  He denies any recent fevers or chills at home.  Exam of the right lower leg does appear significant edema to the entire right lower extremity; which patient states is baseline for him now.  His right lower extremity is warm to the touch; and his toes are pink.  Unable to take the dressing down surrounding the wound VAC for further examination.  On discussion with the patient regarding the possibility of impending worsening infection/questionable sepsis with patient's presenting symptoms this afternoon.  Patient was adamant that he be allowed to return home instead of going directly to the emergency department for further evaluation and management.  Patient stated that his wife is a Engineer, civil (consulting); and would be able to assess him and give him Tylenol as needed.  Advised patient to go directly to the emergency department overnight.  If he develops any worsening symptoms whatsoever.  Also, patient stated that his advanced home health nurse for the visiting him tomorrow for  further evaluation of his leg.

## 2016-02-04 NOTE — Telephone Encounter (Signed)
Left message requesting return call to follow-up on patient status.

## 2016-02-04 NOTE — Progress Notes (Signed)
SYMPTOM MANAGEMENT CLINIC    Chief Complaint: Fever   HPI:  Vincent Brown 55 y.o. male diagnosed with normocytic anemia.  Currently undergoing Feraheme infusions.   Patient presented to the [facility] today to receive his first Feraheme infusion.  It was noted at the end of the Feraheme infusion that patient's heart rate had increased to 110.  Also, patient appeared flushed as well.  Patient had no new complaints whatsoever.  He denies any URI symptoms or UTI symptoms.  Blood pressure remained stable; but, temperature increased to 100.4.  It was also noted on exam that patient had some cyanosis to all of his fingertips as well.  After further discussion with the patient-patient stated that the only source of infection.  He has at this present time is the chronic left lower leg wound/ulcer.  This right lower leg wound has been managed by Dr. Migdalia Dk plastic surgeon; and he is currently wearing a wound VAC to the site.  Patient states that the battery was low to the wound VAC; so he removed the wound VAC just today.  He states that he has an advanced home health nurse come out to manage the wound VAC.  He denies any recent fevers or chills at home.  Exam of the right lower leg does appear significant edema to the entire right lower extremity; which patient states is baseline for him now.  His right lower extremity is warm to the touch; and his toes are pink.  Unable to take the dressing down surrounding the wound VAC for further examination.  On discussion with the patient regarding the possibility of impending worsening infection/questionable sepsis with patient's presenting symptoms this afternoon.  Patient was adamant that he be allowed to return home instead of going directly to the emergency department for further evaluation and management.  Patient stated that his wife is a Marine scientist; and would be able to assess him and give him Tylenol as needed.  Advised patient to go directly to the  emergency department overnight.  If he develops any worsening symptoms whatsoever.  Also, patient stated that his advanced home health nurse for the visiting him tomorrow for further evaluation of his leg.   No history exists.    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  All other systems reviewed and are negative.   Past Medical History  Diagnosis Date  . Coronary artery disease 2012    a. s/p CABG - LIMA - LAD, SVG-OM2, SVG-PDA (11/2010). b. Abnl nuc 09/2015 - R/LHC 10/2015  R/LHC showed patent LIMA-LAD, and SVG-OM2, but occluded SVG-PDA; right heart pressures slightly elevated, otherwise no significant findings.  . Diabetes mellitus 2003  . Hyperlipidemia   . Ischemic cardiomyopathy   . CKD (chronic kidney disease), stage II   . Shingles 2003  . Proliferative retinopathy 2015    treated with injection of Avastin.   . Chronic osteomyelitis of right tibia ([place]) 11/19/2015  . Pseudomonas aeruginosa infection 11/19/2015  . Serratia infection 11/19/2015  . Group B streptococcal infection 11/19/2015  . Bacteroides fragilis 11/19/2015  . Chronic systolic CHF (congestive heart failure) (HCC)     a. unable to tolerate lisinopril/ARB due to dizziness.   . Essential hypertension   . Venous insufficiency     a. severe venous insufficiency with previous R leg ulcers requiring vein stripping.  . Anemia     a. 10/2015: symptomatic; requiring transfusion, EGD  with antral gastritis. Anemia panel more suggestive of AOCD.  Marland Kitchen Gastritis   . Myocardial infarction ([place])   . Shortness of breath dyspnea   . Headache     Past Surgical History  Procedure Laterality Date  . Varicose vein surgery  1995    right lower extremity  . Coronary artery bypass graft  11/05/2010  . Appendectomy  1971  . Refractive surgery  2009, 2015  . Colonoscopy  10/2012    Dr April Manson in [place]. mall sigmoid tics, small internal hemorrhoids.  otherwise normal  screening study.  no polyps, telangectasia...  . Esophagogastroduodenoscopy (egd) with propofol N/A 10/21/2015    Procedure: ESOPHAGOGASTRODUODENOSCOPY (EGD) WITH PROPOFOL;  Surgeon: Mauri Pole, MD;  Location: [facility];  Service: Endoscopy;  Laterality: N/A;  . Cardiac catheterization N/A 10/22/2015    Procedure: Right/Left Heart Cath and Coronary/Graft Angiography;  Surgeon: Jolaine Artist, MD;  Location: [facility];  Service: Cardiovascular;  Laterality: N/A;  . I&d extremity Right 10/24/2015    Procedure: IRRIGATION AND DEBRIDEMENT EXTREMITY;  Surgeon: Loel Lofty Dillingham, DO;  Location: [facility];  Service: Plastics;  Laterality: Right;  . Application of a-cell of extremity Right 10/24/2015    Procedure: APPLICATION OF A-CELL AND PLACEMENT OF WOUND VAC;  Surgeon: Loel Lofty Dillingham, DO;  Location: [facility];  Service: Plastics;  Laterality: Right;  . Fracture surgery       wrist  . I&d extremity Right 01/29/2016    Procedure: RIGHT LEG WOUND IRRIGATION AND DEBRIDEMENT ;  Surgeon: Loel Lofty Dillingham, DO;  Location: [facility];  Service: Plastics;  Laterality: Right;  . Application of a-cell of extremity Right 01/29/2016    Procedure: APPLICATION OF A-CELL OF EXTREMITY;  Surgeon: Loel Lofty Dillingham, DO;  Location: [facility];  Service: Plastics;  Laterality: Right;  . 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    has No Known Allergies.    Medication List       This list is accurate as of: 02/03/16 11:59 PM.  Always use your most recent med list.               Acetaminophen 500 MG coapsule  Take 500 mg by mouth every 4 (four) hours as needed for pain. Reported on 11/19/2015     ascorbic acid 1000 MG tablet  Commonly known as:  VITAMIN C  Take 1,000 mg by mouth 2 (two) times daily.     ciprofloxacin 750 MG tablet  Commonly known as:  CIPRO  Take 1 tablet (750 mg total) by mouth 2 (two) times daily.     cyclobenzaprine 10 MG tablet  Commonly known as:  FLEXERIL  Take 10 mg by mouth at bedtime as needed for muscle spasms.     furosemide 20 MG tablet  Commonly known as:  LASIX  Take 20 mg by mouth daily as needed for fluid or edema.     glimepiride 4 MG tablet  Commonly known as:  AMARYL  Take 4 mg by mouth 2 (two) times daily before a meal. Take two tablets by mouth daily.     INTEGRA PLUS Caps  Take 1 capsule by mouth daily.     metoprolol succinate 50 MG 24 hr tablet  Commonly known as:  TOPROL-XL  Take 75 mg by mouth daily. Take with or immediately following a meal.     metroNIDAZOLE 500 MG tablet  Commonly known as:  FLAGYL  Take 1 tablet (500 mg total) by mouth 3 (three) times daily.     multivitamin tablet  Take 1 tablet by mouth daily.     pantoprazole 40 MG tablet  Commonly known as:  PROTONIX  Take 1 tablet (40 mg total) by mouth daily.     simvastatin 40 MG tablet  Commonly known as:  ZOCOR  Take 1 tablet (40 mg total) by mouth at bedtime.     traMADol 50 MG tablet  Commonly known as:  ULTRAM  Take 50 mg by mouth every 6 (six) hours as needed for moderate pain.     zinc gluconate 50 MG tablet  Take 50 mg by mouth  daily.         PHYSICAL EXAMINATION  Oncology Vitals 02/03/2016 02/03/2016  Height - -  Weight - -  Weight (lbs) - -  BMI (kg/m2) - -  Temp 99.9 98.9  Pulse 109 110  Resp 16 20  SpO2 100 100  BSA (m2) - -   BP Readings from Last 2 Encounters:  02/03/16 104/64  01/29/16 96/70    Physical Exam  Constitutional: He is oriented to person, place, and time. He appears unhealthy.  HENT:  Head: Normocephalic and atraumatic.  Eyes: Conjunctivae and EOM are normal. Pupils are equal, round, and reactive to light. Right eye exhibits no discharge. Left eye exhibits no discharge. No scleral icterus.  Neck: Normal range of motion.  Pulmonary/Chest: Effort normal. No respiratory distress.  Musculoskeletal: Normal range of motion. He exhibits edema. He exhibits no tenderness.  Right lower leg edema noted; with large dressing to right lower leg and wound VAC intact.  Neurological: He is alert and oriented to person, place, and time. Gait normal.  Skin: Skin is warm and dry. No rash noted. No erythema. No pallor.  Psychiatric: Affect normal.  Nursing note and vitals reviewed.   LABORATORY DATA:. No visits with results within 3 Day(s) from this visit. Latest known visit with results is:  Admission on 01/29/2016, Discharged on 01/29/2016  Component Date Value Ref Range Status  . ABO/RH(D) 01/29/2016 O POS   Final  . Antibody Screen 01/29/2016 NEG   Final  . Sample Expiration 01/29/2016 02/01/2016   Final  . WBC 01/29/2016 6.4  4.0 - 10.5 K/uL Final  . RBC 01/29/2016 3.53* 4.22 - 5.81 MIL/uL Final  . Hemoglobin 01/29/2016 8.7* 13.0 - 17.0 g/dL Final  . HCT 01/29/2016 28.4* 39.0 - 52.0 % Final  . MCV 01/29/2016 80.5  78.0 - 100.0 fL Final  . MCH 01/29/2016 24.6* 26.0 - 34.0 pg Final  . MCHC 01/29/2016 30.6  30.0 - 36.0 g/dL Final  . RDW 01/29/2016 17.9* 11.5 - 15.5 % Final  . Platelets 01/29/2016 192  150 - 400 K/uL Final  . Sodium 01/29/2016 134* 135 - 145 mmol/L Final  . Potassium  01/29/2016 4.0  3.5 - 5.1 mmol/L Final  . Chloride 01/29/2016 98* 101 - 111 mmol/L Final  . CO2 01/29/2016 26  22 - 32 mmol/L Final  . Glucose, Bld 01/29/2016 75  65 - 99 mg/dL Final  . BUN 01/29/2016 26* 6 - 20 mg/dL Final  . Creatinine, Ser 01/29/2016 1.31* 0.61 - 1.24 mg/dL Final  . Calcium 01/29/2016 8.4* 8.9 - 10.3 mg/dL Final  . GFR calc non Af Amer 01/29/2016 59* >60 mL/min Final  . GFR calc Af Amer 01/29/2016 >60  >60 mL/min Final   Comment: (NOTE) The eGFR has been calculated using the CKD EPI equation. This calculation has not been validated in all clinical situations. eGFR's persistently <60 mL/min signify possible Chronic Kidney Disease.   . Anion gap 01/29/2016 10  5 - 15 Final  . Glucose-Capillary 01/29/2016 56* 65 - 99 mg/dL Final  . Glucose-Capillary 01/29/2016 92  65 - 99 mg/dL Final  . Glucose-Capillary 01/29/2016 74  65 - 99 mg/dL Final  . Glucose-Capillary 01/29/2016 103* 65 - 99 mg/dL Final  . Comment 1 01/29/2016 Notify RN   Final   RADIOGRAPHIC STUDIES: No results found.  ASSESSMENT/PLAN:    Normocytic anemia Patient has been diagnosed with normocytic anemia; most likely secondary to chronic infection/inflammation of his right lower leg.  Patient presented to the [facility] today to receive his first Feraheme infusion.  Patient tolerated the iron infusion well; with no significant issues.  Patient is scheduled to return on 03/25/2016 for labs and a follow-up visit.  Fever Patient presented to the [facility] today to receive his first Feraheme infusion.  It was noted at the end of the Feraheme infusion that patient's heart rate had increased to 110.  Also, patient appeared flushed as well.  Patient had no new complaints whatsoever.  He denies any URI symptoms or UTI symptoms.  Blood pressure remained stable; but, temperature increased to 100.4.  It was also noted on exam that patient had some cyanosis to all of his fingertips as well.  After  further discussion with the patient-patient stated that the only source of infection.  He has at this present time is the chronic left lower leg wound/ulcer.  This right lower leg wound has been managed by Dr. Migdalia Dk plastic surgeon; and he is currently wearing a wound VAC to the site.  Patient states that the battery was low to the wound VAC; so he removed the wound VAC just today.  He states that he has an advanced home health nurse come out to manage the wound VAC.  He denies any recent fevers or chills at home.  Exam of the right lower leg does appear significant edema to the entire right lower extremity; which patient states is baseline for him now.  His right lower extremity is warm to the touch; and his toes are pink.  Unable to take the dressing down surrounding the wound VAC for further examination.  On discussion with the patient regarding the possibility of impending worsening infection/questionable sepsis with patient's presenting symptoms this afternoon.  Patient was adamant that he be allowed to return home instead of going directly to the emergency department for further evaluation and management.  Patient stated that his wife is a Marine scientist; and would be able to assess him and give him Tylenol as needed.  Advised patient to go directly to the emergency department overnight.  If he develops any worsening symptoms whatsoever.  Also, patient stated that his advanced home health nurse for the  visiting him tomorrow for further evaluation of his leg.  Patient stated understanding of all instructions; and was in agreement with this plan of care. The patient knows to call the clinic with any problems, questions or concerns.   Review/collaboration with Dr. Lindi Adie regarding all aspects of patient's visit today.   Total time spent with patient was 25 minutes;  with greater than 75 percent of that time spent in face to face counseling regarding patient's symptoms,  and coordination of care and  follow up.  Disclaimer:This dictation was prepared with Dragon/digital dictation along with [employer]. Any transcriptional errors that result from this process are unintentional.  Drue Second, NP 02/04/2016

## 2016-02-06 ENCOUNTER — Encounter (HOSPITAL_COMMUNITY): Payer: Self-pay | Admitting: Plastic Surgery

## 2016-02-11 ENCOUNTER — Ambulatory Visit (HOSPITAL_COMMUNITY)
Admission: RE | Admit: 2016-02-11 | Discharge: 2016-02-11 | Disposition: A | Discharge status: Home / Self Care | Payer: Managed Care, Other (non HMO) | Source: Ambulatory Visit | Attending: Internal Medicine | Admitting: Internal Medicine

## 2016-02-11 VITALS — BP 92/58 | HR 110 | Wt 179.0 lb

## 2016-02-11 DIAGNOSIS — I130 Hypertensive heart and chronic kidney disease with heart failure and stage 1 through stage 4 chronic kidney disease, or unspecified chronic kidney disease: Secondary | ICD-10-CM | POA: Diagnosis not present

## 2016-02-11 DIAGNOSIS — E1122 Type 2 diabetes mellitus with diabetic chronic kidney disease: Secondary | ICD-10-CM | POA: Insufficient documentation

## 2016-02-11 DIAGNOSIS — T148XXA Other injury of unspecified body region, initial encounter: Secondary | ICD-10-CM

## 2016-02-11 DIAGNOSIS — I872 Venous insufficiency (chronic) (peripheral): Secondary | ICD-10-CM | POA: Diagnosis not present

## 2016-02-11 DIAGNOSIS — I5023 Acute on chronic systolic (congestive) heart failure: Secondary | ICD-10-CM | POA: Diagnosis not present

## 2016-02-11 DIAGNOSIS — E785 Hyperlipidemia, unspecified: Secondary | ICD-10-CM | POA: Insufficient documentation

## 2016-02-11 DIAGNOSIS — L97912 Non-pressure 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 lower leg with fat layer exposed: Secondary | ICD-10-CM

## 2016-02-11 DIAGNOSIS — I255 Ischemic cardiomyopathy: Secondary | ICD-10-CM | POA: Insufficient documentation

## 2016-02-11 DIAGNOSIS — I252 Old myocardial infarction: Secondary | ICD-10-CM | POA: Diagnosis not present

## 2016-02-11 DIAGNOSIS — Z79899 Other long term (current) drug therapy: Secondary | ICD-10-CM | POA: Diagnosis not present

## 2016-02-11 DIAGNOSIS — I2510 Atherosclerotic heart disease of native coronary artery without angina pectoris: Secondary | ICD-10-CM | POA: Diagnosis not present

## 2016-02-11 DIAGNOSIS — Z8249 Family history of ischemic heart disease and other diseases of the circulatory system: Secondary | ICD-10-CM | POA: Insufficient documentation

## 2016-02-11 DIAGNOSIS — L089 Local infection of the skin and subcutaneous tissue, unspecified: Secondary | ICD-10-CM

## 2016-02-11 DIAGNOSIS — N182 Chronic kidney disease, stage 2 (mild): Secondary | ICD-10-CM | POA: Insufficient documentation

## 2016-02-11 DIAGNOSIS — Z951 Presence of aortocoronary bypass graft: Secondary | ICD-10-CM | POA: Diagnosis not present

## 2016-02-11 DIAGNOSIS — T148 Other injury of unspecified body region: Secondary | ICD-10-CM

## 2016-02-11 DIAGNOSIS — Z7984 Long term (current) use of oral hypoglycemic drugs: Secondary | ICD-10-CM | POA: Diagnosis not present

## 2016-02-11 DIAGNOSIS — D649 Anemia, unspecified: Secondary | ICD-10-CM | POA: Insufficient documentation

## 2016-02-11 MED ORDER — METOLAZONE 2.5 MG PO TABS: 2.5000 mg | ORAL_TABLET | ORAL | Status: DC

## 2016-02-11 MED ORDER — POTASSIUM CHLORIDE CRYS ER 20 MEQ PO TBCR: 40.0000 meq | EXTENDED_RELEASE_TABLET | ORAL | Status: DC

## 2016-02-11 MED ORDER — DIGOXIN 125 MCG PO TABS: 0.1250 mg | ORAL_TABLET | Freq: Every day | ORAL | Status: DC

## 2016-02-11 MED ORDER — FUROSEMIDE 80 MG PO TABS: 80.0000 mg | ORAL_TABLET | Freq: Every day | ORAL | Status: DC

## 2016-02-11 NOTE — Progress Notes (Signed)
Patient ID: Vincent Brown, male   DOB: [birth year], 55 y.o.   MRN: [id number]    [facility]  Date:  02/11/2016  Patient ID:  Vincent Brown, DOB 06/18/1961, MRN [id number] PCP:  Dorothey Baseman, MD  Cardiologist:  DBensimhon (CHF clinic)    Subjective: JACOB CICERO is a 55 y.o. 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  Had a NSTEMI in January 2012 after presenting with bad cough. Had cath at [facility]. LAD 100% LCX 70% RCA p70%, d100%. EF 25%. Cardiac MRI: EF 15% anterior wall and inferior wall infarct with significant viability.  Unable to tolerate lisinopril due to dizziness. Spironolactone stopped due to hyperkalemia.   ECHO 12/2011: EF 25-30% ECHO 03/14/13 EF 25% ECHO 12/15: EF 25%, diffuse hypokinesis, normal RV size, mildly decreased RV systolic function, PA systolic pressure 40 mmHg  12/16 R/LHC showed patent LIMA-LAD, and SVG-OM2, but occluded SVG-PDA; right heart pressures slightly elevated, otherwise no significant findings. Continued medical therapy was recommended. 2D echo 10/22/15: EF 15-20%, severe diffuse HK with regional variations, mild MR, mod LAE, mildly dilated RV with mildly reduced systolic function, mild RAE, PASP (prior EF 25% in 10/2014).   He was found to have large RLE chronic wound and MRI was suggestive of osteomyelitis. Vascular was consulted and felt AKA was the best option but the patient was not willing to have this at this time. Plastic surgery was consulted and performed debridement/wound vac. This is since being followed by ID and plastics. Although EF has remained historically low, the patient has deferred ICD in the past.   Here for HF f/u. Says he feels ok. Still working but has been  having to take 3-4 pills of lasix per day to keep fluid off. Denies DOE, orthopnea or PND. No CP. Feels that his leg is healing.    Past Medical History  Diagnosis Date  . Coronary artery disease 2012    a. s/p CABG - LIMA - LAD, SVG-OM2, SVG-PDA (11/2010). b. Abnl nuc 09/2015 - R/LHC 10/2015  R/LHC showed patent LIMA-LAD, and SVG-OM2, but occluded SVG-PDA; right heart pressures slightly elevated, otherwise no significant findings.  . Diabetes mellitus 2003  . Hyperlipidemia   . Ischemic cardiomyopathy   . CKD (chronic kidney disease), stage II   . Shingles 2003  . Proliferative retinopathy 2015    treated with injection of Avastin.   . Chronic osteomyelitis of right tibia (HCC) 11/19/2015  . Pseudomonas aeruginosa infection 11/19/2015  . Serratia infection 11/19/2015  . Group B streptococcal infection 11/19/2015  . Bacteroides fragilis 11/19/2015  . Chronic systolic CHF (congestive heart failure) (HCC)     a. unable to tolerate lisinopril/ARB due to dizziness.   . Essential hypertension   . Venous insufficiency     a. severe venous insufficiency with previous R leg ulcers requiring vein stripping.  . Anemia     a. 10/2015: symptomatic; requiring transfusion, EGD with antral gastritis. Anemia panel more suggestive of AOCD.  Marland Kitchen Gastritis   . Myocardial infarction (HCC)   . Shortness of breath dyspnea   . Headache     Past Surgical History  Procedure Laterality Date  . Varicose vein surgery  1995    right lower extremity  . Coronary artery bypass graft  11/05/2010  . Appendectomy  1971  . Refractive surgery  2009, 2015  . Colonoscopy  10/2012    Dr Silver Huguenin in [place]. mall sigmoid tics, small internal hemorrhoids.  otherwise normal screening study.  no polyps, telangectasia...  . Esophagogastroduodenoscopy (egd) with propofol N/A 10/21/2015    Procedure: ESOPHAGOGASTRODUODENOSCOPY (EGD) WITH PROPOFOL;  Surgeon: Napoleon Form, MD;  Location: [facility];  Service:  Endoscopy;  Laterality: N/A;  . Cardiac catheterization N/A 10/22/2015    Procedure: Right/Left Heart Cath and Coronary/Graft Angiography;  Surgeon: Dolores Patty, MD;  Location: [facility] INVASIVE CV LAB;  Service: Cardiovascular;  Laterality: N/A;  . I&d extremity Right 10/24/2015    Procedure: IRRIGATION AND DEBRIDEMENT EXTREMITY;  Surgeon: Alena Bills Dillingham, DO;  Location: [facility] OR;  Service: Plastics;  Laterality: Right;  . Application of a-cell of extremity Right 10/24/2015    Procedure: APPLICATION OF A-CELL AND PLACEMENT OF WOUND VAC;  Surgeon: Alena Bills Dillingham, DO;  Location: [facility] OR;  Service: Plastics;  Laterality: Right;  . Fracture surgery       wrist  . I&d extremity Right 01/29/2016    Procedure: RIGHT LEG WOUND IRRIGATION AND DEBRIDEMENT ;  Surgeon: Alena Bills Dillingham, DO;  Location: [facility] OR;  Service: Plastics;  Laterality: Right;  . Application of a-cell of extremity Right 01/29/2016    Procedure: APPLICATION OF A-CELL OF EXTREMITY;  Surgeon: Alena Bills Dillingham, DO;  Location: [facility];  Service: Plastics;  Laterality: Right;  . Application of wound vac Right 01/29/2016    Procedure: APPLICATION OF WOUND VAC;  Surgeon: Alena Bills Dillingham, DO;  Location: [facility] OR;  Service: Plastics;  Laterality: Right;    Current Outpatient Prescriptions  Medication Sig Dispense Refill  . Acetaminophen 500 MG coapsule Take 500 mg by mouth every 4 (four) hours as needed for pain. Reported on 11/19/2015    . ascorbic acid (VITAMIN C) 1000 MG tablet Take 1,000 mg by mouth 2 (two) times daily.    . ciprofloxacin (CIPRO) 750 MG tablet Take 1 tablet (750 mg total) by mouth 2 (two) times daily. 60 tablet 1  . cyclobenzaprine (FLEXERIL) 10 MG tablet Take 10 mg by mouth at bedtime as needed for muscle spasms.     . FeFum-FePoly-FA-B Cmp-C-Biot (INTEGRA PLUS) CAPS Take 1 capsule by mouth daily. 30 capsule 3  . furosemide (LASIX) 20 MG tablet Take 20 mg by mouth daily as needed for fluid or edema.    Marland Kitchen  glimepiride (AMARYL) 4 MG tablet Take 4 mg by mouth 2 (two) times daily before a meal. Take two tablets by mouth daily.    . metoprolol succinate (TOPROL-XL) 50 MG 24 hr tablet Take 75 mg by mouth daily. Take with or immediately following a meal.    . metroNIDAZOLE (FLAGYL) 500 MG tablet Take 1 tablet (500 mg total) by mouth 3 (three) times daily. 90 tablet 1  . Multiple Vitamin (MULTIVITAMIN) tablet Take 1 tablet by mouth daily.    . pantoprazole (PROTONIX) 40 MG tablet Take 1 tablet (40 mg total) by mouth daily. (Patient taking differently: Take 40 mg by mouth as needed. ) 30 tablet 5  . traMADol (ULTRAM) 50 MG tablet Take 50 mg by mouth every 6 (six) hours as needed for moderate pain.     Marland Kitchen zinc gluconate 50 MG tablet Take 50 mg by mouth daily.     No current facility-administered medications for this encounter.    Allergies:   Review of patient's allergies indicates no  known allergies.   Social History:  The patient  reports that he has never smoked. He has never used smokeless tobacco. He reports that he does not drink alcohol or use illicit drugs.   Family History:  The patient's family history includes Hypertension in his father; Other in his father. There is no history of Colon cancer.  ROS:  Please see the history of present illness.    All other systems are reviewed and otherwise negative.   PHYSICAL EXAM:  VS:  BP 92/58 mmHg  Pulse 110  Wt 179 lb (81.194 kg)  SpO2 92% BMI: Body mass index is 24.98 kg/(m^2). NAD. Wearing wound vac apparatus HEENT: normocephalic, atraumatic Neck: no JVP to jaw, carotid bruits or masses Cardiac:  normal S1, S2; tachy +s3; no murmurs, rubs, Lungs:  clear to auscultation bilaterally, no wheezing, rhonchi or rales Abd: soft, nontender, + mildly distended no hepatomegaly, + BS MS: no deformity or atrophy Ext: wrapped with wound vac on RLE. 3+ tight edema on L Skin: warm and dry, no rash Neuro:  moves all extremities spontaneously, no focal  abnormalities noted, follows commands Psych: euthymic mood, full affect   Recent Labs: 01/21/2016: ALT 10 01/29/2016: BUN 26*; Creatinine, Ser 1.31*; Hemoglobin 8.7*; Platelets 192; Potassium 4.0; Sodium 134*  No results found for requested labs within last 365 days.   Estimated Creatinine Clearance: 66.3 mL/min (by C-G formula based on Cr of 1.31).   Wt Readings from Last 3 Encounters:  02/11/16 179 lb (81.194 kg)  01/29/16 171 lb (77.565 kg)  01/21/16 175 lb 4.8 oz (79.516 kg)     Other studies reviewed: Additional studies/records reviewed today include: summarized above  ASSESSMENT AND PLAN:  1. Acute on Chronic systolic CHF/ICM -iCM EF 15-20% (echo 12/16).  --On toprol 75 daily. Losartan stopped long ago due to low BP. Cleda Daub 12.5 stopped in February due to hyperkalemia  --Volume status markedly elevated. Resume lasix 40 daily. Take Metolazone 2.5 mg daily for 3 days.Take Potassium 40 meq (2 tab) daily for 3 days ONLY --Start digoxin 0.125 mg daily --See back next week with BMET --Has refused ICD in past. 2. CAD s/p CABG as above - recent cath stable. No chest pain. --Cath 12/16 --Severe 3v CAD as above with patent LIMA to LAD and patent SVG to OM --SVG to RPDA is occluded but there are good L->R and R->R collaterals 3. CKD stage II  4. Anemia - has been seen by Dr. Pamelia Hoit in [facility] and getting iron infusions. 5. Osteomyelitis - has f/u with plastics and ID.  Bensimhon, Daniel,MD 5:44 PM

## 2016-02-11 NOTE — Patient Instructions (Signed)
Start taking Furosemide (Lasix) 80 mg daily, we have sent you in a new prescription for 80 mg tablets  Take Metolazone 2.5 mg daily for 3 days ONLY, we have given you 10 tabs to have extra but do not take unless we advise you to do so  Take Potassium 40 meq (2 tab) daily for 3 days ONLY  Start Digoxin 0.125 mg daily  Your physician recommends that you schedule a follow-up appointment in: 1 week

## 2016-02-15 DIAGNOSIS — I5023 Acute on chronic systolic (congestive) heart failure: Secondary | ICD-10-CM | POA: Insufficient documentation

## 2016-02-19 ENCOUNTER — Encounter (HOSPITAL_COMMUNITY): Payer: Self-pay | Admitting: Internal Medicine

## 2016-02-19 ENCOUNTER — Ambulatory Visit (HOSPITAL_COMMUNITY)
Admission: RE | Admit: 2016-02-19 | Discharge: 2016-02-19 | Disposition: A | Discharge status: Home / Self Care | Payer: Managed Care, Other (non HMO) | Source: Ambulatory Visit | Attending: Internal Medicine | Admitting: Internal Medicine

## 2016-02-19 VITALS — BP 98/60 | HR 89 | Wt 156.8 lb

## 2016-02-19 DIAGNOSIS — I255 Ischemic cardiomyopathy: Secondary | ICD-10-CM | POA: Diagnosis not present

## 2016-02-19 DIAGNOSIS — D649 Anemia, unspecified: Secondary | ICD-10-CM | POA: Insufficient documentation

## 2016-02-19 DIAGNOSIS — E113599 Type 2 diabetes mellitus with proliferative diabetic retinopathy without macular edema, unspecified eye: Secondary | ICD-10-CM | POA: Insufficient documentation

## 2016-02-19 DIAGNOSIS — I5022 Chronic systolic (congestive) heart failure: Secondary | ICD-10-CM | POA: Diagnosis present

## 2016-02-19 DIAGNOSIS — E1122 Type 2 diabetes mellitus with diabetic chronic kidney disease: Secondary | ICD-10-CM | POA: Insufficient documentation

## 2016-02-19 DIAGNOSIS — I252 Old myocardial infarction: Secondary | ICD-10-CM | POA: Diagnosis not present

## 2016-02-19 DIAGNOSIS — Z8249 Family history of ischemic heart disease and other diseases of the circulatory system: Secondary | ICD-10-CM | POA: Diagnosis not present

## 2016-02-19 DIAGNOSIS — I872 Venous insufficiency (chronic) (peripheral): Secondary | ICD-10-CM | POA: Diagnosis not present

## 2016-02-19 DIAGNOSIS — M869 Osteomyelitis, unspecified: Secondary | ICD-10-CM | POA: Diagnosis not present

## 2016-02-19 DIAGNOSIS — N182 Chronic kidney disease, stage 2 (mild): Secondary | ICD-10-CM | POA: Insufficient documentation

## 2016-02-19 DIAGNOSIS — Z79899 Other long term (current) drug therapy: Secondary | ICD-10-CM | POA: Insufficient documentation

## 2016-02-19 DIAGNOSIS — Z951 Presence of aortocoronary bypass graft: Secondary | ICD-10-CM | POA: Diagnosis not present

## 2016-02-19 DIAGNOSIS — E785 Hyperlipidemia, unspecified: Secondary | ICD-10-CM | POA: Diagnosis not present

## 2016-02-19 DIAGNOSIS — I2510 Atherosclerotic heart disease of native coronary artery without angina pectoris: Secondary | ICD-10-CM | POA: Diagnosis not present

## 2016-02-19 DIAGNOSIS — I130 Hypertensive heart and chronic kidney disease with heart failure and stage 1 through stage 4 chronic kidney disease, or unspecified chronic kidney disease: Secondary | ICD-10-CM | POA: Diagnosis not present

## 2016-02-19 LAB — BASIC METABOLIC PANEL
ANION GAP: 11 (ref 5–15)
BUN: 30 mg/dL — ABNORMAL HIGH (ref 6–20)
CALCIUM: 8.8 mg/dL — AB (ref 8.9–10.3)
CO2: 26 mmol/L (ref 22–32)
CREATININE: 1.35 mg/dL — AB (ref 0.61–1.24)
Chloride: 102 mmol/L (ref 101–111)
GFR, EST NON AFRICAN AMERICAN: 57 mL/min — AB (ref >60)
Glucose, Bld: 117 mg/dL — ABNORMAL HIGH (ref 65–99)
Potassium: 4.6 mmol/L (ref 3.5–5.1)
Sodium: 139 mmol/L (ref 135–145)

## 2016-02-19 LAB — DIGOXIN LEVEL: DIGOXIN LVL: 0.4 ng/mL — AB (ref 0.8–2.0)

## 2016-02-19 MED ORDER — METOLAZONE 2.5 MG PO TABS: 2.5000 mg | ORAL_TABLET | ORAL | Status: DC | PRN

## 2016-02-19 NOTE — Patient Instructions (Signed)
Take Metolazone 2.5mg  (1 tablet) daily as needed for swelling.  Routine lab work today. Will notify you of abnormal results  Follow up with Dr.Bensimhon in 6 weeks.

## 2016-02-19 NOTE — Progress Notes (Addendum)
Advanced Heart Failure Medication Review by a Pharmacist  Does the patient  feel that his/her medications are working for him/her?  yes  Has the patient been experiencing any side effects to the medications prescribed?  yes  Does the patient measure his/her own blood pressure or blood glucose at home?  no   Does the patient have any problems obtaining medications due to transportation or finances?   no  Understanding of regimen: good Understanding of indications: good Potential of compliance: good Patient understands to avoid NSAIDs. Patient understands to avoid decongestants.  Issues to address at subsequent visits: photosensitivity   Pharmacist comments: Vincent Brown is a 55 yo man here for f/u in HF clinic. No issues with medications, has been taking medications as prescribed. Was fluid overloaded at last visit and given metolazone 2.5 mg qday x 3 days with KCl 20 mEq with metolazone. Consistently takes furosemide 80 mg qday.  At last visit on 4/12, was also started on digoxin. Denies N/V, however does have photosensitivity that started last week, described as white, bright light that makes it difficult for him to see some numbers on the clock in the mornings. Will check digoxin level.  Vincent Brown, [place].Algis Downs BPCS PGY2 Cardiology Pharmacy Resident Pager: [phone]    Time with patient: 5 min Preparation and documentation time: 5 min Total time: 10 min

## 2016-02-19 NOTE — Progress Notes (Signed)
Patient ID: Vincent Brown, male   DOB: Nov 22, 1960, 55 y.o.   MRN: [id number] Patient ID: Vincent Brown, male   DOB: 1961/08/03, 55 y.o.   MRN: [id number]    [facility]  Date:  02/19/2016  Patient ID:  Vincent Brown, Pacer Sep 23, 1961, MRN [id number] PCP:  Dorothey Baseman, MD  Cardiologist:  DBensimhon (CHF clinic)    Subjective: Vincent Brown is a 55 y.o. 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  Had a NSTEMI in January 2012 after presenting with bad cough. Had cath at [facility] LAD 100% LCX 70% RCA p70%, d100%. EF 25%. Cardiac MRI: EF 15% anterior wall and inferior wall infarct with significant viability.  Unable to tolerate lisinopril due to dizziness. Spironolactone stopped due to hyperkalemia.   ECHO 12/2011: EF 25-30% ECHO 03/14/13 EF 25% ECHO 12/15: EF 25%, diffuse hypokinesis, normal RV size, mildly decreased RV systolic function, PA systolic pressure 40 mmHg  12/16 R/LHC showed patent LIMA-LAD, and SVG-OM2, but occluded SVG-PDA; right heart pressures slightly elevated, otherwise no significant findings. Continued medical therapy was recommended. 2D echo 10/22/15: EF 15-20%, severe diffuse HK with regional variations, mild MR, mod LAE, mildly dilated RV with mildly reduced systolic function, mild RAE, PASP (prior EF 25% in 10/2014).   He was found to have large RLE chronic wound and MRI was suggestive of osteomyelitis. Vascular was consulted and felt AKA was the best option but the patient was not willing to have this at this time. Plastic surgery was consulted and performed debridement/wound vac. This is since being followed by ID and plastics. Although EF has remained historically low, the patient has deferred ICD in the  past.   Here for HF f/u. At last visit was markedly volume overloaded. Gave metolazone and lasix increased to  daily. Now down 23 pounds. Says he feels better. Denies DOE, orthopnea or PND. No CP. Feels that his leg is healing.    Past Medical History  Diagnosis Date  . Coronary artery disease 2012    a. s/p CABG - LIMA - LAD, SVG-OM2, SVG-PDA (11/2010). b. Abnl nuc 09/2015 - R/LHC 10/2015  R/LHC showed patent LIMA-LAD, and SVG-OM2, but occluded SVG-PDA; right heart pressures slightly elevated, otherwise no significant findings.  . Diabetes mellitus 2003  . Hyperlipidemia   . Ischemic cardiomyopathy   . CKD (chronic kidney disease), stage II   . Shingles 2003  . Proliferative retinopathy 2015    treated with injection of Avastin.   . Chronic osteomyelitis of right tibia (HCC) 11/19/2015  . Pseudomonas aeruginosa infection 11/19/2015  . Serratia infection 11/19/2015  . Group B streptococcal infection 11/19/2015  . Bacteroides fragilis 11/19/2015  . Chronic systolic CHF (congestive heart failure) (HCC)     a. unable to tolerate lisinopril/ARB due to dizziness.   . Essential hypertension   . Venous insufficiency     a. severe venous insufficiency with previous R leg ulcers requiring vein stripping.  . Anemia     a. 10/2015: symptomatic; requiring transfusion, EGD with antral gastritis. Anemia panel more suggestive of AOCD.  Marland Kitchen Gastritis   . Myocardial infarction (HCC)   . Shortness of breath dyspnea   . Headache     Past Surgical History  Procedure Laterality Date  .  Varicose vein surgery  1995    right lower extremity  . Coronary artery bypass graft  11/05/2010  . Appendectomy  1971  . Refractive surgery  2009, 2015  . Colonoscopy  10/2012    Dr Silver Huguenin in [place]. mall sigmoid tics, small internal hemorrhoids.  otherwise normal screening study.  no polyps, telangectasia...  . Esophagogastroduodenoscopy (egd) with propofol N/A 10/21/2015    Procedure:  ESOPHAGOGASTRODUODENOSCOPY (EGD) WITH PROPOFOL;  Surgeon: Napoleon Form, MD;  Location: [facility];  Service: Endoscopy;  Laterality: N/A;  . Cardiac catheterization N/A 10/22/2015    Procedure: Right/Left Heart Cath and Coronary/Graft Angiography;  Surgeon: Dolores Patty, MD;  Location: [facility] INVASIVE CV LAB;  Service: Cardiovascular;  Laterality: N/A;  . I&d extremity Right 10/24/2015    Procedure: IRRIGATION AND DEBRIDEMENT EXTREMITY;  Surgeon: Alena Bills Dillingham, DO;  Location: [facility] OR;  Service: Plastics;  Laterality: Right;  . Application of a-cell of extremity Right 10/24/2015    Procedure: APPLICATION OF A-CELL AND PLACEMENT OF WOUND VAC;  Surgeon: Alena Bills Dillingham, DO;  Location: [facility] OR;  Service: Plastics;  Laterality: Right;  . Fracture surgery       wrist  . I&d extremity Right 01/29/2016    Procedure: RIGHT LEG WOUND IRRIGATION AND DEBRIDEMENT ;  Surgeon: Alena Bills Dillingham, DO;  Location: [facility] OR;  Service: Plastics;  Laterality: Right;  . Application of a-cell of extremity Right 01/29/2016    Procedure: APPLICATION OF A-CELL OF EXTREMITY;  Surgeon: Alena Bills Dillingham, DO;  Location: [facility] OR;  Service: Plastics;  Laterality: Right;  . Application of wound vac Right 01/29/2016    Procedure: APPLICATION OF WOUND VAC;  Surgeon: Alena Bills Dillingham, DO;  Location: [facility] OR;  Service: Plastics;  Laterality: Right;    Current Outpatient Prescriptions  Medication Sig Dispense Refill  . digoxin (LANOXIN) 0.125 MG tablet Take 1 tablet (0.125 mg total) by mouth daily. 30 tablet 3  . furosemide (LASIX) 80 MG tablet Take 1 tablet (80 mg total) by mouth daily. 30 tablet 3  . Acetaminophen 500 MG coapsule Take 500 mg by mouth every 4 (four) hours as needed for pain. Reported on 11/19/2015    . ascorbic acid (VITAMIN C) 1000 MG tablet Take 1,000 mg by mouth 2 (two) times daily.    . ciprofloxacin (CIPRO) 750 MG tablet Take 1 tablet (750 mg total) by mouth 2 (two) times daily. 60 tablet 1  .  cyclobenzaprine (FLEXERIL) 10 MG tablet Take 10 mg by mouth at bedtime as needed for muscle spasms.     . FeFum-FePoly-FA-B Cmp-C-Biot (INTEGRA PLUS) CAPS Take 1 capsule by mouth daily. 30 capsule 3  . glimepiride (AMARYL) 4 MG tablet Take 4 mg by mouth 2 (two) times daily before a meal. Take two tablets by mouth daily.    . metolazone (ZAROXOLYN) 2.5 MG tablet Take 1 tablet (2.5 mg total) by mouth as directed. 10 tablet 3  . metoprolol succinate (TOPROL-XL) 50 MG 24 hr tablet Take 75 mg by mouth daily. Take with or immediately following a meal.    . metroNIDAZOLE (FLAGYL) 500 MG tablet Take 1 tablet (500 mg total) by mouth 3 (three) times daily. 90 tablet 1  . Multiple Vitamin (MULTIVITAMIN) tablet Take 1 tablet by mouth daily.    . pantoprazole (PROTONIX) 40 MG tablet Take 1 tablet (40 mg total) by mouth daily. (Patient taking differently: Take 40 mg by mouth as needed. ) 30 tablet 5  . potassium chloride SA (  K-DUR,KLOR-CON) 20 MEQ tablet Take 2 tablets (40 mEq total) by mouth as directed. Take when you take Metolazone 20 tablet 3  . traMADol (ULTRAM) 50 MG tablet Take 50 mg by mouth every 6 (six) hours as needed for moderate pain.     Marland Kitchen zinc gluconate 50 MG tablet Take 50 mg by mouth daily.     No current facility-administered medications for this encounter.    Allergies:   Review of patient's allergies indicates no known allergies.   Social History:  The patient  reports that he has never smoked. He has never used smokeless tobacco. He reports that he does not drink alcohol or use illicit drugs.   Family History:  The patient's family history includes Hypertension in his father; Other in his father. There is no history of Colon cancer.  ROS:  Please see the history of present illness.    All other systems are reviewed and otherwise negative.   PHYSICAL EXAM:  VS:  BP 98/60 mmHg  Pulse 89  Wt 156 lb 12 oz (71.101 kg)  SpO2 98% BMI: Body mass index is 21.87 kg/(m^2). NAD. Wearing wound  vac apparatus HEENT: normocephalic, atraumatic Neck: no JVP 8 carotid bruits or masses Cardiac:  normal S1, S2; tachy +s3; no murmurs, rubs, Lungs:  clear to auscultation bilaterally, no wheezing, rhonchi or rales Abd: soft, nontender, + mildly distended no hepatomegaly, + BS MS: no deformity or atrophy Ext: wrapped with wound vac on RLE. 2+  edema on L Skin: warm and dry, no rash Neuro:  moves all extremities spontaneously, no focal abnormalities noted, follows commands Psych: euthymic mood, full affect   Recent Labs: 01/21/2016: ALT 10 01/29/2016: BUN 26*; Creatinine, Ser 1.31*; Hemoglobin 8.7*; Platelets 192; Potassium 4.0; Sodium 134*  No results found for requested labs within last 365 days.   CrCl cannot be calculated (Patient has no serum creatinine result on file.).   Wt Readings from Last 3 Encounters:  02/19/16 156 lb 12 oz (71.101 kg)  02/11/16 179 lb (81.194 kg)  01/29/16 171 lb (77.565 kg)     Other studies reviewed: Additional studies/records reviewed today include: summarized above  ASSESSMENT AND PLAN:  1. Chronic systolic CHF/ICM -iCM EF 15-20% (echo 12/16).  --On toprol 75 daily. Losartan stopped long ago due to low BP. Cleda Daub 12.5 stopped in February due to hyperkalemia  --Volume status much improved on lasix 80mg  daily. Will continue. Can take metolazone as needed.  --Continue digoxin 0.125 mg daily --Check BMET and digoxin today. --Has refuses ICD. 2. CAD s/p CABG as above - recent cath stable. No chest pain. --Cath 12/16 --Severe 3v CAD as above with patent LIMA to LAD and patent SVG to OM --SVG to RPDA is occluded but there are good L->R and R->R collaterals 3. CKD stage II  4. Anemia - has been seen by Dr. Pamelia Hoit in [facility] and getting iron infusions. 5. Osteomyelitis/RLE wound - has f/u with plastics and ID.  Rashonda Warrior,MD 3:32 PM

## 2016-02-23 ENCOUNTER — Other Ambulatory Visit (INDEPENDENT_AMBULATORY_CARE_PROVIDER_SITE_OTHER): Payer: Managed Care, Other (non HMO)

## 2016-02-23 ENCOUNTER — Encounter: Payer: Self-pay | Admitting: Gastroenterology

## 2016-02-23 ENCOUNTER — Ambulatory Visit (INDEPENDENT_AMBULATORY_CARE_PROVIDER_SITE_OTHER): Payer: Managed Care, Other (non HMO) | Admitting: Gastroenterology

## 2016-02-23 VITALS — BP 90/58 | HR 84 | Ht 71.0 in | Wt 157.4 lb

## 2016-02-23 DIAGNOSIS — D509 Iron deficiency anemia, unspecified: Secondary | ICD-10-CM

## 2016-02-23 DIAGNOSIS — K219 Gastro-esophageal reflux disease without esophagitis: Secondary | ICD-10-CM

## 2016-02-23 LAB — CBC WITH DIFFERENTIAL/PLATELET
BASOS PCT: 0.8 % (ref 0.0–3.0)
Basophils Absolute: 0 10*3/uL (ref 0.0–0.1)
Eosinophils Absolute: 0.1 10*3/uL (ref 0.0–0.7)
Eosinophils Relative: 1.2 % (ref 0.0–5.0)
HEMATOCRIT: 31.2 % — AB (ref 39.0–52.0)
Hemoglobin: 10 g/dL — ABNORMAL LOW (ref 13.0–17.0)
Lymphocytes Relative: 28.1 % (ref 12.0–46.0)
Lymphs Abs: 1.3 10*3/uL (ref 0.7–4.0)
MCHC: 32.1 g/dL (ref 30.0–36.0)
MCV: 86.6 fl (ref 78.0–100.0)
MONOS PCT: 9.3 % (ref 3.0–12.0)
Monocytes Absolute: 0.4 10*3/uL (ref 0.1–1.0)
NEUTROS ABS: 2.8 10*3/uL (ref 1.4–7.7)
Neutrophils Relative %: 60.6 % (ref 43.0–77.0)
PLATELETS: 204 10*3/uL (ref 150.0–400.0)
RBC: 3.6 Mil/uL — ABNORMAL LOW (ref 4.22–5.81)
RDW: 29.3 % — AB (ref 11.5–15.5)
WBC: 4.6 10*3/uL (ref 4.0–10.5)

## 2016-02-23 LAB — IBC PANEL
Iron: 67 ug/dL (ref 42–165)
Saturation Ratios: 22.3 % (ref 20.0–50.0)
Transferrin: 215 mg/dL (ref 212.0–360.0)

## 2016-02-23 LAB — FERRITIN: Ferritin: 316.5 ng/mL (ref 22.0–322.0)

## 2016-02-23 NOTE — Patient Instructions (Signed)
Increase Protonix to twice daily Go to the basement for labs today Follow up in 4 months

## 2016-02-24 NOTE — Progress Notes (Signed)
Vincent Brown    [id number]    05-08-1961  Primary Care Physician:DAVID Terance Hart, MD  Referring Physician: Dorothey Baseman, MD [address] [place], [place] [address]  Chief complaint:  Iron deficiency anemia  HPI: 55 year old male with history of congestive heart failure and symptomatic anemia here for follow-up visit. He also has chronic kidney disease stage II, chronic right lower extremity wound status post debridement with wound VAC.   Colonoscopy in 2015 was normal per patient, EGD December 2016  showed mild antral erosive gastritis . At time hemoglobin was 9.4, on 11/03/2015 hemoglobin 8.4 and on 11/12/2015 hemoglobin 7.6. He has had iron studies done by his PCP , serum iron was 24. Small bowel video capsule 12/18/2015 did not show any findings to suggest possible small bowel etiology for blood loss. His hemoglobin remained in the range 7-8 with low iron saturation. He was evaluated by hematology in March and was noted to be iron and B12 deficient. His chronic anemia was thought to be secondary to chronic disease due to inflammation/infection in addition to the B12 and iron deficiency. He received iron infusion. Feels better since infusion and is not as fatigued. Complaints of heartburn and regurgitation when he lays down. Denies any nausea, vomiting, abdominal pain, melena or bright red blood per rectum   Outpatient Encounter Prescriptions as of 02/23/2016  Medication Sig  . Acetaminophen 500 MG coapsule Take 500 mg by mouth every 4 (four) hours as needed for pain. Reported on 11/19/2015  . ascorbic acid (VITAMIN C) 1000 MG tablet Take 1,000 mg by mouth 2 (two) times daily.  . ciprofloxacin (CIPRO) 750 MG tablet Take 1 tablet (750 mg total) by mouth 2 (two) times daily.  . cyclobenzaprine (FLEXERIL) 10 MG tablet Take 10 mg by mouth at bedtime as needed for muscle spasms.   . digoxin (LANOXIN) 0.125 MG tablet Take 1 tablet (0.125 mg total) by mouth daily.  Marland Kitchen  FeFum-FePoly-FA-B Cmp-C-Biot (INTEGRA PLUS) CAPS Take 1 capsule by mouth daily.  . furosemide (LASIX) 80 MG tablet Take 1 tablet (80 mg total) by mouth daily.  Marland Kitchen glimepiride (AMARYL) 4 MG tablet Take 4 mg by mouth 2 (two) times daily before a meal. Take two tablets by mouth daily.  . metolazone (ZAROXOLYN) 2.5 MG tablet Take 1 tablet (2.5 mg total) by mouth as needed (for edema.).  Marland Kitchen metoprolol succinate (TOPROL-XL) 50 MG 24 hr tablet Take 75 mg by mouth daily. Take with or immediately following a meal.  . metroNIDAZOLE (FLAGYL) 500 MG tablet Take 1 tablet (500 mg total) by mouth 3 (three) times daily.  . Multiple Vitamin (MULTIVITAMIN) tablet Take 1 tablet by mouth daily.  . potassium chloride SA (K-DUR,KLOR-CON) 20 MEQ tablet Take 2 tablets (40 mEq total) by mouth as directed. Take when you take Metolazone   No facility-administered encounter medications on file as of 02/23/2016.    Allergies as of 02/23/2016  . (No Known Allergies)    Past Medical History  Diagnosis Date  . Coronary artery disease 2012    a. s/p CABG - LIMA - LAD, SVG-OM2, SVG-PDA (11/2010). b. Abnl nuc 09/2015 - R/LHC 10/2015  R/LHC showed patent LIMA-LAD, and SVG-OM2, but occluded SVG-PDA; right heart pressures slightly elevated, otherwise no significant findings.  . Diabetes mellitus 2003  . Hyperlipidemia   . Ischemic cardiomyopathy   . CKD (chronic kidney disease), stage II   . Shingles 2003  . Proliferative retinopathy 2015  treated with injection of Avastin.   . Chronic osteomyelitis of right tibia (HCC) 11/19/2015  . Pseudomonas aeruginosa infection 11/19/2015  . Serratia infection 11/19/2015  . Group B streptococcal infection 11/19/2015  . Bacteroides fragilis 11/19/2015  . Chronic systolic CHF (congestive heart failure) (HCC)     a. unable to tolerate lisinopril/ARB due to dizziness.   . Essential hypertension   . Venous insufficiency     a. severe venous insufficiency with previous R leg ulcers requiring  vein stripping.  . Anemia     a. 10/2015: symptomatic; requiring transfusion, EGD with antral gastritis. Anemia panel more suggestive of AOCD.  Marland Kitchen Gastritis   . Myocardial infarction (HCC)   . Shortness of breath dyspnea   . Headache     Past Surgical History  Procedure Laterality Date  . Varicose vein surgery  1995    right lower extremity  . Coronary artery bypass graft  11/05/2010  . Appendectomy  1971  . Refractive surgery  2009, 2015  . Colonoscopy  10/2012    Dr Silver Huguenin in [place]. mall sigmoid tics, small internal hemorrhoids.  otherwise normal screening study.  no polyps, telangectasia...  . Esophagogastroduodenoscopy (egd) with propofol N/A 10/21/2015    Procedure: ESOPHAGOGASTRODUODENOSCOPY (EGD) WITH PROPOFOL;  Surgeon: Napoleon Form, MD;  Location: [facility] END[facility];  Service: Endoscopy;  Laterality: N/A;  . Cardiac catheterization N/A 10/22/2015    Procedure: Right/Left Heart Cath and Coronary/Graft Angiography;  Surgeon: Dolores Patty, MD;  Location: [facility] INVASIVE CV LAB;  Service: Cardiovascular;  Laterality: N/A;  . I&d extremity Right 10/24/2015    Procedure: IRRIGATION AND DEBRIDEMENT EXTREMITY;  Surgeon: Alena Bills Dillingham, DO;  Location: [facility] OR;  Service: Plastics;  Laterality: Right;  . Application of a-cell of extremity Right 10/24/2015    Procedure: APPLICATION OF A-CELL AND PLACEMENT OF WOUND VAC;  Surgeon: Alena Bills Dillingham, DO;  Location: [facility] OR;  Service: Plastics;  Laterality: Right;  . Fracture surgery       wrist  . I&d extremity Right 01/29/2016    Procedure: RIGHT LEG WOUND IRRIGATION AND DEBRIDEMENT ;  Surgeon: Alena Bills Dillingham, DO;  Location: [facility] OR;  Service: Plastics;  Laterality: Right;  . Application of a-cell of extremity Right 01/29/2016    Procedure: APPLICATION OF A-CELL OF EXTREMITY;  Surgeon: Alena Bills Dillingham, DO;  Location: [facility] OR;  Service: Plastics;  Laterality: Right;  . Application of wound vac Right 01/29/2016    Procedure:  APPLICATION OF WOUND VAC;  Surgeon: Alena Bills Dillingham, DO;  Location: [facility] OR;  Service: Plastics;  Laterality: Right;    Family History  Problem Relation Age of Onset  . Hypertension Father   . Other Father     Pacemaker  . Colon cancer Neg Hx     Social History   Social History  . Marital Status: Married    Spouse Name: N/A  . Number of Children: 2  . Years of Education: N/A   Occupational History  . Works at [employer]    Social History Main Topics  . Smoking status: Never Smoker   . Smokeless tobacco: Never Used  . Alcohol Use: No  . Drug Use: No  . Sexual Activity: Not on file   Other Topics Concern  . Not on file   Social History Narrative      Review of systems: Review of Systems  Constitutional: Negative for fever and chills.  HENT: Negative.   Eyes: Negative for blurred vision.  Respiratory:  Negative for cough, shortness of breath and wheezing.   Cardiovascular: Negative for chest pain and palpitations.  Gastrointestinal: as per HPI Genitourinary: Negative for dysuria, urgency, frequency and hematuria.  Musculoskeletal: Negative for myalgias, back pain and joint pain.  Skin: Negative for itching and rash.  Neurological: Negative for dizziness, tremors, focal weakness, seizures and loss of consciousness.  Endo/Heme/Allergies: Negative for environmental allergies.  Psychiatric/Behavioral: Negative for depression, suicidal ideas and hallucinations.  All other systems reviewed and are negative.   Physical Exam: Filed Vitals:   02/23/16 1523  BP: 90/58  Pulse: 84   Gen:      No acute distress HEENT:  EOMI, sclera anicteric Neck:     No masses; no thyromegaly Lungs:    Clear to auscultation bilaterally; normal respiratory effort CV:         Regular rate and rhythm; + murmurs Abd:      + bowel sounds; soft, non-tender; no palpable masses, no distension Ext:    B/l edema; RLE wound VAC Skin:      Warm and dry; no rash Neuro: alert and oriented x  3 Psych: normal mood and affect  Data Reviewed:  Reviewed chart in epic   Assessment and Plan/Recommendations:  55 year old male with severe CHF status post CABG, CK D, iron and B12 deficiency and anemia of chronic disease here for follow-up visit Patient has no signs of overt GI bleed Workup so far negative for any significant etiology for possible GI blood loss Agree with supportive therapy with IV Iron, B12 and erythropoietin if needed Based on his repeat CBC done today, he seems to have responded well to IV iron therapy. Due for recall screening colonoscopy in 2025 GERD: Increase PPI to twice daily 30 minutes before breakfast and dinner Follow antireflux measures Return as needed  K. Scherry Ran , MD [phone] Mon-Fri 8a-5p [phone] after 5p, weekends, holidays

## 2016-03-24 ENCOUNTER — Other Ambulatory Visit: Payer: Self-pay

## 2016-03-24 DIAGNOSIS — D649 Anemia, unspecified: Secondary | ICD-10-CM

## 2016-03-25 ENCOUNTER — Other Ambulatory Visit: Payer: Managed Care, Other (non HMO)

## 2016-03-25 ENCOUNTER — Ambulatory Visit: Payer: Managed Care, Other (non HMO) | Admitting: Hematology and Oncology

## 2016-03-25 NOTE — Assessment & Plan Note (Signed)
Normocytic anemia: Although MCV is in the lower side of the normal at 78. Diagnosed December 2016 when patient was admitted for heart cath and was given 2 units of PRBC Nonhealing right leg ulcer with infection on antibiotics for the past 3 months with wound VAC  Differential diagnosis: 1. Anemia of chronic disease due to inflammation/infection 2. Iron and B-12 deficiencies 3. Hemolysis 4. Blood loss  Workup on 02/23/2016 Hemoglobin 10, MCV 86.6, RDW 29 Serum iron 67, iron saturation 22%, ferritin 316.5 Workup and 01/21/2016: Normal B-12, fully casted, no evidence of hemolysis  I strongly suspect that this is anemia due to chronic inflammation and infection Patient works at [employer] and still works full-time. 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

## 2016-04-05 ENCOUNTER — Encounter (HOSPITAL_COMMUNITY): Payer: Self-pay | Admitting: *Deleted

## 2016-04-05 ENCOUNTER — Other Ambulatory Visit: Payer: Self-pay | Admitting: Plastic Surgery

## 2016-04-05 DIAGNOSIS — S81801A Unspecified open wound, right lower leg, initial encounter: Secondary | ICD-10-CM

## 2016-04-05 NOTE — H&P (Signed)
Vincent Brown is an 55 y.o. male.   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 He was hospitalized with CHF and a severe wound on his leg.  Amputation was suggested and refused.  He was taken to the OR for an attempt at limb salvage. He underwent irrigation and debridement of his right lower leg venous stasis ulcer with placement of Acell and the VAC (10/24/15). Home Health RN has been doing VAC dressing changes twice weekly. The operative cultures grew Pseudomonas/Serratia. Both were sensitive to Cipro for which ID is following him along with his flagyl. He underwent repeat Acell and VAC placement in the OR ~01/29/16.  The area has very good granulation tissue at this time.  The edema has improved.    Past Medical History  Diagnosis Date  . Coronary artery disease 2012    a. s/p CABG - LIMA - LAD, SVG-OM2, SVG-PDA (11/2010). b. Abnl nuc 09/2015 - R/LHC 10/2015  R/LHC showed patent LIMA-LAD, and SVG-OM2, but occluded SVG-PDA; right heart pressures slightly elevated, otherwise no significant findings.  . Diabetes mellitus 2003  . Hyperlipidemia   . Ischemic cardiomyopathy   . CKD (chronic kidney disease), stage II   . Shingles 2003  . Proliferative retinopathy 2015    treated with injection of Avastin.   . Chronic osteomyelitis of right tibia (HCC) 11/19/2015  . Pseudomonas aeruginosa infection 11/19/2015  . Serratia infection 11/19/2015  . Group B streptococcal infection 11/19/2015  . Bacteroides fragilis 11/19/2015  . Chronic systolic CHF (congestive heart failure) (HCC)     a. unable to tolerate lisinopril/ARB due to dizziness.   . Essential hypertension   . Venous insufficiency     a. severe venous insufficiency with previous R leg ulcers requiring vein stripping.  . Anemia     a. 10/2015: symptomatic; requiring  transfusion, EGD with antral gastritis. Anemia panel more suggestive of AOCD.  Marland Kitchen Gastritis   . Myocardial infarction (HCC)   . Shortness of breath dyspnea   . Headache     Past Surgical History  Procedure Laterality Date  . Varicose vein surgery  1995    right lower extremity  . Coronary artery bypass graft  11/05/2010  . Appendectomy  1971  . Refractive surgery  2009, 2015  . Colonoscopy  10/2012    Dr Silver Huguenin in [place]. mall sigmoid tics, small internal hemorrhoids.  otherwise normal screening study.  no polyps, telangectasia...  . Esophagogastroduodenoscopy (egd) with propofol N/A 10/21/2015    Procedure: ESOPHAGOGASTRODUODENOSCOPY (EGD) WITH PROPOFOL;  Surgeon: Napoleon Form, MD;  Location: [facility];  Service: Endoscopy;  Laterality: N/A;  . Cardiac catheterization N/A 10/22/2015    Procedure: Right/Left Heart Cath and Coronary/Graft Angiography;  Surgeon: Dolores Patty, MD;  Location[facility] INVASIVE CV LAB;  Service: Cardiovascular;  Laterality: N/A;  . I&d extremity Right 10/24/2015    Procedure: IRRIGATION AND DEBRIDEMENT EXTREMITY;  Surgeon: Alena Bills Dillingham, DO;  Location: [facility] OR;  Service: Plastics;  Laterality: Right;  . Application of a-cell of extremity Right 10/24/2015    Procedure: APPLICATION OF A-CELL AND PLACEMENT OF WOUND VAC;  Surgeon: Alena Bills Dillingham, DO;  Location: [facility] OR;  Service: Plastics;  Laterality: Right;  . Fracture surgery       wrist  . I&d extremity Right 01/29/2016  Procedure: RIGHT LEG WOUND IRRIGATION AND DEBRIDEMENT ;  Surgeon: Claire S Dillingham, DO;  Location: [facility] OR;  Service: Plastics;  Laterality: Right;  . Application of a-cell of extremity Right 01/29/2016    Procedure: APPLICATION OF A-CELL OF EXTREMITY;  Surgeon: Claire S Dillingham, DO;  Location: [facility] OR;  Service: Plastics;  Laterality: Right;  . Application of wound vac Right 01/29/2016    Procedure: APPLICATION OF WOUND VAC;  Surgeon: Claire S Dillingham, DO;   Location: [facility] OR;  Service: Plastics;  Laterality: Right;    Family History  Problem Relation Age of Onset  . Hypertension Father   . Other Father     Pacemaker  . Parkinson's disease Father   . Colon cancer Neg Hx    Social History:  reports that he has never smoked. He has never used smokeless tobacco. He reports that he does not drink alcohol or use illicit drugs.  Allergies: No Known Allergies   (Not in a hospital admission)  No results found for this or any previous visit (from the past 48 hour(s)). No results found.  Review of Systems  Constitutional: Negative.   HENT: Negative.   Eyes: Negative.   Respiratory: Negative.   Cardiovascular: Negative.   Gastrointestinal: Negative.   Genitourinary: Negative.   Musculoskeletal: Negative.   Skin: Negative.   Neurological: Negative.   Psychiatric/Behavioral: Negative.     There were no vitals taken for this visit. Physical Exam  Constitutional: He is oriented to person, place, and time. He appears well-developed and well-nourished.  HENT:  Head: Normocephalic and atraumatic.  Eyes: Conjunctivae and EOM are normal. Pupils are equal, round, and reactive to light.  Cardiovascular: Normal rate.   Respiratory: Effort normal. No respiratory distress.  GI: Soft. He exhibits no distension.  Musculoskeletal: He exhibits edema and tenderness.  Neurological: He is alert and oriented to person, place, and time.  Skin: Skin is warm.  Psychiatric: He has a normal mood and affect. His behavior is normal. Judgment and thought content normal.     Assessment/Plan Plan for right leg wound split thickness skin graft and placement of VAC and Acell on the donor site.  CLAIRE S DILLINGHAM, DO 04/05/2016, 7:41 PM    

## 2016-04-05 NOTE — Progress Notes (Signed)
Pt denies any acute cardiopulmonary issues. Pt made aware to stop taking vitamins, fish oil and herbal medications. Do not take any NSAIDs ie: Ibuprofen, Advil, Naproxen, BC and Goody Powder or any medication containing Aspirin. Pt stated that he does not take Aspirin. Pt stated that his fasting blood sugar ranges from 65-80.  Pt made aware of diabetes protocol to not take Glimeride the night before surgery and the morning of surgery, check BS every 2 hours prior to arrival and interventions for BS< 70 and >220 and phone number to SS. Pt stated that he takes his Digoxin and Metoprolol at HS. Pt verbalized understanding of all pre-op instructions.

## 2016-04-06 MED ORDER — CEFAZOLIN SODIUM-DEXTROSE 2-4 GM/100ML-% IV SOLN
2.0000 g | INTRAVENOUS | Status: AC
  Administered 2016-04-07: 2 g via INTRAVENOUS
  Filled 2016-04-06: qty 100

## 2016-04-07 ENCOUNTER — Encounter (HOSPITAL_COMMUNITY): Payer: Self-pay | Admitting: Plastic Surgery

## 2016-04-07 ENCOUNTER — Encounter (HOSPITAL_COMMUNITY)
Admission: RE | Disposition: A | Discharge status: Home / Self Care | Payer: Self-pay | Source: Ambulatory Visit | Attending: Plastic Surgery

## 2016-04-07 ENCOUNTER — Ambulatory Visit (HOSPITAL_COMMUNITY)
Admission: RE | Admit: 2016-04-07 | Discharge: 2016-04-07 | Disposition: A | Discharge status: Home / Self Care | Payer: Managed Care, Other (non HMO) | Source: Ambulatory Visit | Attending: Plastic Surgery | Admitting: Plastic Surgery

## 2016-04-07 ENCOUNTER — Ambulatory Visit (HOSPITAL_COMMUNITY): Payer: Managed Care, Other (non HMO) | Admitting: Certified Registered Nurse Anesthetist

## 2016-04-07 DIAGNOSIS — L97919 Non-pressure 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 lower leg with unspecified severity: Secondary | ICD-10-CM | POA: Diagnosis present

## 2016-04-07 DIAGNOSIS — I252 Old myocardial infarction: Secondary | ICD-10-CM | POA: Diagnosis not present

## 2016-04-07 DIAGNOSIS — I130 Hypertensive heart and chronic kidney disease with heart failure and stage 1 through stage 4 chronic kidney disease, or unspecified chronic kidney disease: Secondary | ICD-10-CM | POA: Insufficient documentation

## 2016-04-07 DIAGNOSIS — E1122 Type 2 diabetes mellitus with diabetic chronic kidney disease: Secondary | ICD-10-CM | POA: Diagnosis not present

## 2016-04-07 DIAGNOSIS — I2510 Atherosclerotic heart disease of native coronary artery without angina pectoris: Secondary | ICD-10-CM | POA: Insufficient documentation

## 2016-04-07 DIAGNOSIS — I5022 Chronic systolic (congestive) heart failure: Secondary | ICD-10-CM | POA: Diagnosis not present

## 2016-04-07 DIAGNOSIS — Z955 Presence of coronary angioplasty implant and graft: Secondary | ICD-10-CM | POA: Diagnosis not present

## 2016-04-07 DIAGNOSIS — Z79899 Other long term (current) drug therapy: Secondary | ICD-10-CM | POA: Diagnosis not present

## 2016-04-07 DIAGNOSIS — N182 Chronic kidney disease, stage 2 (mild): Secondary | ICD-10-CM | POA: Insufficient documentation

## 2016-04-07 DIAGNOSIS — Z7984 Long term (current) use of oral hypoglycemic drugs: Secondary | ICD-10-CM | POA: Diagnosis not present

## 2016-04-07 DIAGNOSIS — I509 Heart failure, unspecified: Secondary | ICD-10-CM | POA: Diagnosis not present

## 2016-04-07 DIAGNOSIS — E785 Hyperlipidemia, unspecified: Secondary | ICD-10-CM | POA: Insufficient documentation

## 2016-04-07 DIAGNOSIS — E1151 Type 2 diabetes mellitus with diabetic peripheral angiopathy without gangrene: Secondary | ICD-10-CM | POA: Insufficient documentation

## 2016-04-07 DIAGNOSIS — S81801A Unspecified open wound, right lower leg, initial encounter: Secondary | ICD-10-CM

## 2016-04-07 HISTORY — PX: APPLICATION OF A-CELL OF EXTREMITY: SHX6303

## 2016-04-07 HISTORY — PX: SKIN SPLIT GRAFT: SHX444

## 2016-04-07 LAB — BASIC METABOLIC PANEL
Anion gap: 7 (ref 5–15)
BUN: 20 mg/dL (ref 6–20)
CO2: 28 mmol/L (ref 22–32)
Calcium: 9.2 mg/dL (ref 8.9–10.3)
Chloride: 99 mmol/L — ABNORMAL LOW (ref 101–111)
Creatinine, Ser: 0.92 mg/dL (ref 0.61–1.24)
GFR calc Af Amer: >60 mL/min (ref >60)
GLUCOSE: 63 mg/dL — AB (ref 65–99)
POTASSIUM: 4.8 mmol/L (ref 3.5–5.1)
Sodium: 134 mmol/L — ABNORMAL LOW (ref 135–145)

## 2016-04-07 LAB — CBC
HEMATOCRIT: 36.4 % — AB (ref 39.0–52.0)
Hemoglobin: 11.5 g/dL — ABNORMAL LOW (ref 13.0–17.0)
MCH: 29.1 pg (ref 26.0–34.0)
MCHC: 31.6 g/dL (ref 30.0–36.0)
MCV: 92.2 fL (ref 78.0–100.0)
Platelets: 176 10*3/uL (ref 150–400)
RBC: 3.95 MIL/uL — ABNORMAL LOW (ref 4.22–5.81)
RDW: 16.6 % — AB (ref 11.5–15.5)
WBC: 7.3 10*3/uL (ref 4.0–10.5)

## 2016-04-07 LAB — GLUCOSE, CAPILLARY
Glucose-Capillary: 56 mg/dL — ABNORMAL LOW (ref 65–99)
Glucose-Capillary: 89 mg/dL (ref 65–99)
Glucose-Capillary: 144 mg/dL — ABNORMAL HIGH (ref 65–99)
Glucose-Capillary: 69 mg/dL (ref 65–99)
Glucose-Capillary: 86 mg/dL (ref 65–99)

## 2016-04-07 SURGERY — APPLICATION, GRAFT, SKIN, SPLIT-THICKNESS
Anesthesia: General | Site: Leg Lower | Laterality: Right

## 2016-04-07 MED ORDER — PROPOFOL 10 MG/ML IV BOLUS
INTRAVENOUS | Status: DC | PRN
  Administered 2016-04-07: 100 mg via INTRAVENOUS
  Administered 2016-04-07 (×2): 50 mg via INTRAVENOUS

## 2016-04-07 MED ORDER — FENTANYL CITRATE (PF) 250 MCG/5ML IJ SOLN
INTRAMUSCULAR | Status: AC
  Filled 2016-04-07: qty 5

## 2016-04-07 MED ORDER — PROPOFOL 10 MG/ML IV BOLUS
INTRAVENOUS | Status: AC
  Filled 2016-04-07: qty 20

## 2016-04-07 MED ORDER — DEXTROSE 50 % IV SOLN
INTRAVENOUS | Status: AC
  Administered 2016-04-07: 25 mL via INTRAVENOUS
  Filled 2016-04-07: qty 50

## 2016-04-07 MED ORDER — PHENYLEPHRINE HCL 10 MG/ML IJ SOLN
INTRAMUSCULAR | Status: DC | PRN
  Administered 2016-04-07 (×3): 80 ug via INTRAVENOUS
  Administered 2016-04-07: 120 ug via INTRAVENOUS

## 2016-04-07 MED ORDER — ONDANSETRON HCL 4 MG/2ML IJ SOLN: 4.0000 mg | Freq: Once | INTRAMUSCULAR | Status: DC | PRN

## 2016-04-07 MED ORDER — MIDAZOLAM HCL 5 MG/5ML IJ SOLN
INTRAMUSCULAR | Status: DC | PRN
  Administered 2016-04-07: 2 mg via INTRAVENOUS

## 2016-04-07 MED ORDER — ONDANSETRON HCL 4 MG/2ML IJ SOLN
INTRAMUSCULAR | Status: DC | PRN
  Administered 2016-04-07: 4 mg via INTRAVENOUS

## 2016-04-07 MED ORDER — DEXTROSE 50 % IV SOLN
25.0000 mL | Freq: Once | INTRAVENOUS | Status: AC
  Administered 2016-04-07: 25 mL via INTRAVENOUS

## 2016-04-07 MED ORDER — LIDOCAINE HCL (CARDIAC) 20 MG/ML IV SOLN
INTRAVENOUS | Status: DC | PRN
  Administered 2016-04-07: 60 mg via INTRAVENOUS

## 2016-04-07 MED ORDER — PHENYLEPHRINE HCL 10 MG/ML IJ SOLN
10.0000 mg | INTRAMUSCULAR | Status: DC | PRN
  Administered 2016-04-07: 50 ug/min via INTRAVENOUS

## 2016-04-07 MED ORDER — ALPRAZOLAM 0.25 MG PO TABS: 0.2500 mg | ORAL_TABLET | Freq: Two times a day (BID) | ORAL | Status: DC | PRN

## 2016-04-07 MED ORDER — MIDAZOLAM HCL 2 MG/2ML IJ SOLN
INTRAMUSCULAR | Status: AC
  Filled 2016-04-07: qty 2

## 2016-04-07 MED ORDER — BUPIVACAINE HCL (PF) 0.25 % IJ SOLN
INTRAMUSCULAR | Status: AC
  Filled 2016-04-07: qty 30

## 2016-04-07 MED ORDER — LIDOCAINE 2% (20 MG/ML) 5 ML SYRINGE
INTRAMUSCULAR | Status: AC
  Filled 2016-04-07: qty 5

## 2016-04-07 MED ORDER — HYDROCODONE-ACETAMINOPHEN 5-325 MG PO TABS: 1.0000 | ORAL_TABLET | Freq: Four times a day (QID) | ORAL | Status: DC | PRN

## 2016-04-07 MED ORDER — SODIUM CHLORIDE 0.9 % IR SOLN
Status: DC | PRN
  Administered 2016-04-07: 500 mL

## 2016-04-07 MED ORDER — FENTANYL CITRATE (PF) 100 MCG/2ML IJ SOLN: 25.0000 ug | INTRAMUSCULAR | Status: DC | PRN

## 2016-04-07 MED ORDER — 0.9 % SODIUM CHLORIDE (POUR BTL) OPTIME
TOPICAL | Status: DC | PRN
  Administered 2016-04-07: 1000 mL

## 2016-04-07 MED ORDER — BUPIVACAINE-EPINEPHRINE (PF) 0.25% -1:200000 IJ SOLN
INTRAMUSCULAR | Status: AC
  Filled 2016-04-07: qty 30

## 2016-04-07 MED ORDER — EPINEPHRINE HCL 1 MG/ML IJ SOLN
INTRAMUSCULAR | Status: AC
  Filled 2016-04-07: qty 1

## 2016-04-07 MED ORDER — LACTATED RINGERS IV SOLN
INTRAVENOUS | Status: DC
  Administered 2016-04-07 (×2): via INTRAVENOUS

## 2016-04-07 MED ORDER — EPINEPHRINE HCL 1 MG/ML IJ SOLN
INTRAMUSCULAR | Status: DC | PRN
  Administered 2016-04-07: 1 mg

## 2016-04-07 MED ORDER — BUPIVACAINE-EPINEPHRINE 0.25% -1:200000 IJ SOLN
INTRAMUSCULAR | Status: DC | PRN
  Administered 2016-04-07: 20 mL

## 2016-04-07 SURGICAL SUPPLY — 69 items
BAG DECANTER FOR FLEXI CONT (MISCELLANEOUS) ×2 IMPLANT
BANDAGE ACE 4X5 VEL STRL LF (GAUZE/BANDAGES/DRESSINGS) ×4 IMPLANT
BANDAGE ELASTIC 4 VELCRO ST LF (GAUZE/BANDAGES/DRESSINGS) IMPLANT
BANDAGE ELASTIC 6 VELCRO ST LF (GAUZE/BANDAGES/DRESSINGS) IMPLANT
BENZOIN TINCTURE PRP APPL 2/3 (GAUZE/BANDAGES/DRESSINGS) IMPLANT
BLADE 10 SAFETY STRL DISP (BLADE) IMPLANT
BLADE DERMATOME II (BLADE) ×2 IMPLANT
BLADE SURG ROTATE 9660 (MISCELLANEOUS) IMPLANT
BNDG GAUZE ELAST 4 BULKY (GAUZE/BANDAGES/DRESSINGS) ×2 IMPLANT
CANISTER SUCTION 2500CC (MISCELLANEOUS) IMPLANT
CANISTER WOUND CARE 500ML ATS (WOUND CARE) IMPLANT
COVER SURGICAL LIGHT HANDLE (MISCELLANEOUS) ×2 IMPLANT
DECANTER SPIKE VIAL GLASS SM (MISCELLANEOUS) ×2 IMPLANT
DERMACARRIERS GRAFT 1 TO 1.5 (DISPOSABLE) ×2
DRAPE INCISE IOBAN 66X45 STRL (DRAPES) ×2 IMPLANT
DRAPE ORTHO SPLIT 77X108 STRL (DRAPES) ×2
DRAPE PROXIMA HALF (DRAPES) ×2 IMPLANT
DRAPE SURG ORHT 6 SPLT 77X108 (DRAPES) ×2 IMPLANT
DRESSING HYDROCOLLOID 4X4 XTH (GAUZE/BANDAGES/DRESSINGS) ×2 IMPLANT
DRSG ADAPTIC 3X8 NADH LF (GAUZE/BANDAGES/DRESSINGS) IMPLANT
DRSG CUTIMED SORBACT 7X9 (GAUZE/BANDAGES/DRESSINGS) ×4 IMPLANT
DRSG OPSITE 6X11 MED (GAUZE/BANDAGES/DRESSINGS) IMPLANT
DRSG PAD ABDOMINAL 8X10 ST (GAUZE/BANDAGES/DRESSINGS) ×2 IMPLANT
DRSG TELFA 3X8 NADH (GAUZE/BANDAGES/DRESSINGS) IMPLANT
DRSG VAC ATS LRG SENSATRAC (GAUZE/BANDAGES/DRESSINGS) IMPLANT
DRSG VAC ATS MED SENSATRAC (GAUZE/BANDAGES/DRESSINGS) ×2 IMPLANT
DRSG VAC ATS SM SENSATRAC (GAUZE/BANDAGES/DRESSINGS) IMPLANT
ELECT CAUTERY BLADE 6.4 (BLADE) ×2 IMPLANT
ELECT REM PT RETURN 9FT ADLT (ELECTROSURGICAL)
ELECTRODE REM PT RTRN 9FT ADLT (ELECTROSURGICAL) IMPLANT
FILTER STRAW FLUID ASPIR (MISCELLANEOUS) IMPLANT
GAUZE SPONGE 4X4 12PLY STRL (GAUZE/BANDAGES/DRESSINGS) ×2 IMPLANT
GAUZE XEROFORM 5X9 LF (GAUZE/BANDAGES/DRESSINGS) IMPLANT
GEL ULTRASOUND 20GR AQUASONIC (MISCELLANEOUS) ×2 IMPLANT
GLOVE BIO SURGEON STRL SZ 6.5 (GLOVE) ×4 IMPLANT
GOWN STRL REUS W/ TWL LRG LVL3 (GOWN DISPOSABLE) ×3 IMPLANT
GOWN STRL REUS W/TWL LRG LVL3 (GOWN DISPOSABLE) ×3
GRAFT DERMACARRIERS 1 TO 1.5 (DISPOSABLE) ×1 IMPLANT
HANDPIECE INTERPULSE COAX TIP (DISPOSABLE)
KIT BASIN OR (CUSTOM PROCEDURE TRAY) ×2 IMPLANT
KIT ROOM TURNOVER OR (KITS) ×2 IMPLANT
MATRIX SURGICAL PSM 10X15CM (Tissue) ×2 IMPLANT
NEEDLE FILTER BLUNT 18X 1/2SAF (NEEDLE) ×1
NEEDLE FILTER BLUNT 18X1 1/2 (NEEDLE) ×1 IMPLANT
NEEDLE HYPO 25GX1X1/2 BEV (NEEDLE) ×2 IMPLANT
NEEDLE SPNL 18GX3.5 QUINCKE PK (NEEDLE) IMPLANT
NS IRRIG 1000ML POUR BTL (IV SOLUTION) ×2 IMPLANT
PACK GENERAL/GYN (CUSTOM PROCEDURE TRAY) ×2 IMPLANT
PACK ORTHO EXTREMITY (CUSTOM PROCEDURE TRAY) ×2 IMPLANT
PAD ARMBOARD 7.5X6 YLW CONV (MISCELLANEOUS) ×2 IMPLANT
SET HNDPC FAN SPRY TIP SCT (DISPOSABLE) IMPLANT
SPLINT FIBERGLASS 4X30 (CAST SUPPLIES) ×2 IMPLANT
STAPLER VISISTAT 35W (STAPLE) ×2 IMPLANT
STOCKINETTE IMPERVIOUS 9X36 MD (GAUZE/BANDAGES/DRESSINGS) IMPLANT
STOCKINETTE IMPERVIOUS LG (DRAPES) IMPLANT
SUT CHROMIC 4 0 PS 2 18 (SUTURE) IMPLANT
SUT SILK 4 0 P 3 (SUTURE) ×4 IMPLANT
SUT SILK 4 0 PS 2 (SUTURE) IMPLANT
SUT VIC AB 5-0 P-3 18XBRD (SUTURE) ×2 IMPLANT
SUT VIC AB 5-0 P3 18 (SUTURE) ×2
SUT VIC AB 5-0 PS2 18 (SUTURE) ×8 IMPLANT
SYR 3ML 25GX5/8 SAFETY (SYRINGE) IMPLANT
SYR CONTROL 10ML LL (SYRINGE) ×2 IMPLANT
TAPE CLOTH SURG 6X10 WHT LF (GAUZE/BANDAGES/DRESSINGS) ×2 IMPLANT
TOWEL OR 17X24 6PK STRL BLUE (TOWEL DISPOSABLE) ×2 IMPLANT
TOWEL OR 17X26 10 PK STRL BLUE (TOWEL DISPOSABLE) ×2 IMPLANT
TUBE CONNECTING 12X1/4 (SUCTIONS) ×2 IMPLANT
UNDERPAD 30X30 INCONTINENT (UNDERPADS AND DIAPERS) ×2 IMPLANT
YANKAUER SUCT BULB TIP NO VENT (SUCTIONS) ×2 IMPLANT

## 2016-04-07 NOTE — Progress Notes (Signed)
Orthopedic Tech Progress Note Patient Details:  Vincent Brown November 22, 1960 [id number]  Patient ID: Vincent Brown, male   DOB: 30-Jul-1961, 55 y.o.   MRN: [id number] Viewed order from doctor's order list  Nikki Dom 04/07/2016, 12:28 PM

## 2016-04-07 NOTE — Discharge Instructions (Addendum)
Do not change or remove vac [place] gel to the right thigh daily Walk with crutches and try not to put any weight on the right leg/foot

## 2016-04-07 NOTE — Anesthesia Postprocedure Evaluation (Signed)
Anesthesia Post Note  Patient: Vincent Brown  Procedure(s) Performed: Procedure(s) (LRB): SKIN GRAFT SPLIT THICKNESS TO RIGHT LOWER LEG (Right) APPLICATION OF A-CELL OF EXTREMITY AND VAC DRESSING CHANGE (Right)  Patient location during evaluation: PACU Anesthesia Type: General Level of consciousness: awake and alert Pain management: pain level controlled Vital Signs Assessment: post-procedure vital signs reviewed and stable Respiratory status: spontaneous breathing, nonlabored ventilation, respiratory function stable and patient connected to nasal cannula oxygen Cardiovascular status: blood pressure returned to baseline and stable Postop Assessment: no signs of nausea or vomiting Anesthetic complications: no    Last Vitals:  Filed Vitals:   04/07/16 1239 04/07/16 1245  BP: 95/69 101/73  Pulse: 87 87  Temp: 37.1 C   Resp: 20 18    Last Pain:  Filed Vitals:   04/07/16 1249  PainSc: 0-No pain                 Reino Kent

## 2016-04-07 NOTE — Anesthesia Preprocedure Evaluation (Signed)
Anesthesia Evaluation  Patient identified by MRN, date of birth, ID band Patient awake    Reviewed: Allergy & Precautions, NPO status , Patient's Chart, lab work & pertinent test results  History of Anesthesia Complications Negative for: history of anesthetic complications  Airway Mallampati: II  TM Distance: >3 FB Neck ROM: Full    Dental no notable dental hx.    Pulmonary shortness of breath and with exertion,    breath sounds clear to auscultation       Cardiovascular hypertension, + CAD, + Past MI, + Peripheral Vascular Disease, +CHF and + DOE   Rhythm:Regular  EF 20%   Neuro/Psych  Headaches,    GI/Hepatic negative GI ROS, Neg liver ROS,   Endo/Other  diabetes, Type 2, Oral Hypoglycemic AgentsCBG 0815 92  Renal/GU Renal InsufficiencyRenal disease     Musculoskeletal   Abdominal   Peds  Hematology  (+) anemia , 3/30 0734 Hb 8.7 Hct 28.4    Anesthesia Other Findings   Reproductive/Obstetrics                             Anesthesia Physical  Anesthesia Plan  ASA: III  Anesthesia Plan: General   Post-op Pain Management:    Induction: Intravenous  Airway Management Planned: LMA  Additional Equipment: None  Intra-op Plan:   Post-operative Plan: Extubation in OR  Informed Consent: I have reviewed the patients History and Physical, chart, labs and discussed the procedure including the risks, benefits and alternatives for the proposed anesthesia with the patient or authorized representative who has indicated his/her understanding and acceptance.   Dental advisory given  Plan Discussed with: CRNA and Surgeon  Anesthesia Plan Comments:                                          Anesthesia Evaluation  Patient identified by MRN, date of birth, ID band Patient awake    Reviewed: Allergy & Precautions, H&P , NPO status , Patient's Chart, lab work & pertinent test results,  reviewed documented beta blocker date and time   Airway Mallampati: I  TM Distance: >3 FB Neck ROM: Full    Dental no notable dental hx. (+) Teeth Intact, Dental Advisory Given   Pulmonary neg pulmonary ROS,    Pulmonary exam normal breath sounds clear to auscultation       Cardiovascular + CAD, + CABG and +CHF   Rhythm:Regular Rate:Normal     Neuro/Psych negative neurological ROS  negative psych ROS   GI/Hepatic negative GI ROS, Neg liver ROS,   Endo/Other  diabetes, Type 2, Oral Hypoglycemic Agents  Renal/GU Renal InsufficiencyRenal disease  negative genitourinary   Musculoskeletal   Abdominal   Peds  Hematology negative hematology ROS (+)   Anesthesia Other Findings   Reproductive/Obstetrics negative OB ROS                            Anesthesia Physical Anesthesia Plan  ASA: III  Anesthesia Plan: General   Post-op Pain Management:    Induction: Intravenous  Airway Management Planned: LMA  Additional Equipment:   Intra-op Plan:   Post-operative Plan: Extubation in OR  Informed Consent: I have reviewed the patients History and Physical, chart, labs and discussed the procedure including the risks, benefits and alternatives for the proposed anesthesia  with the patient or authorized representative who has indicated his/her understanding and acceptance.   Dental advisory given  Plan Discussed with: CRNA  Anesthesia Plan Comments:        Anesthesia Quick Evaluation  Anesthesia Quick Evaluation

## 2016-04-07 NOTE — H&P (View-Only) (Signed)
Vincent Brown is an 55 y.o. male.   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 He was hospitalized with CHF and a severe wound on his leg.  Amputation was suggested and refused.  He was taken to the OR for an attempt at limb salvage. He underwent irrigation and debridement of his right lower leg venous stasis ulcer with placement of Acell and the VAC (10/24/15). Home Health RN has been doing VAC dressing changes twice weekly. The operative cultures grew Pseudomonas/Serratia. Both were sensitive to Cipro for which ID is following him along with his flagyl. He underwent repeat Acell and VAC placement in the OR ~01/29/16.  The area has very good granulation tissue at this time.  The edema has improved.    Past Medical History  Diagnosis Date  . Coronary artery disease 2012    a. s/p CABG - LIMA - LAD, SVG-OM2, SVG-PDA (11/2010). b. Abnl nuc 09/2015 - R/LHC 10/2015  R/LHC showed patent LIMA-LAD, and SVG-OM2, but occluded SVG-PDA; right heart pressures slightly elevated, otherwise no significant findings.  . Diabetes mellitus 2003  . Hyperlipidemia   . Ischemic cardiomyopathy   . CKD (chronic kidney disease), stage II   . Shingles 2003  . Proliferative retinopathy 2015    treated with injection of Avastin.   . Chronic osteomyelitis of right tibia (HCC) 11/19/2015  . Pseudomonas aeruginosa infection 11/19/2015  . Serratia infection 11/19/2015  . Group B streptococcal infection 11/19/2015  . Bacteroides fragilis 11/19/2015  . Chronic systolic CHF (congestive heart failure) (HCC)     a. unable to tolerate lisinopril/ARB due to dizziness.   . Essential hypertension   . Venous insufficiency     a. severe venous insufficiency with previous R leg ulcers requiring vein stripping.  . Anemia     a. 10/2015: symptomatic; requiring  transfusion, EGD with antral gastritis. Anemia panel more suggestive of AOCD.  Marland Kitchen Gastritis   . Myocardial infarction (HCC)   . Shortness of breath dyspnea   . Headache     Past Surgical History  Procedure Laterality Date  . Varicose vein surgery  1995    right lower extremity  . Coronary artery bypass graft  11/05/2010  . Appendectomy  1971  . Refractive surgery  2009, 2015  . Colonoscopy  10/2012    Dr Silver Huguenin in [place]. mall sigmoid tics, small internal hemorrhoids.  otherwise normal screening study.  no polyps, telangectasia...  . Esophagogastroduodenoscopy (egd) with propofol N/A 10/21/2015    Procedure: ESOPHAGOGASTRODUODENOSCOPY (EGD) WITH PROPOFOL;  Surgeon: Napoleon Form, MD;  Location: [facility] ENDOSCOPY;  Service: Endoscopy;  Laterality: N/A;  . Cardiac catheterization N/A 10/22/2015    Procedure: Right/Left Heart Cath and Coronary/Graft Angiography;  Surgeon: Dolores Patty, MD;  Location: [facility] INVASIVE CV LAB;  Service: Cardiovascular;  Laterality: N/A;  . I&d extremity Right 10/24/2015    Procedure: IRRIGATION AND DEBRIDEMENT EXTREMITY;  Surgeon: Alena Bills Daishawn Lauf, DO;  Location: [facility] OR;  Service: Plastics;  Laterality: Right;  . Application of a-cell of extremity Right 10/24/2015    Procedure: APPLICATION OF A-CELL AND PLACEMENT OF WOUND VAC;  Surgeon: Alena Bills Meziah Blasingame, DO;  Location: [facility] OR;  Service: Plastics;  Laterality: Right;  . Fracture surgery       wrist  . I&d extremity Right 01/29/2016  Procedure: RIGHT LEG WOUND IRRIGATION AND DEBRIDEMENT ;  Surgeon: Alena Bills Jesson Foskey, DO;  Location: [facility] OR;  Service: Plastics;  Laterality: Right;  . Application of a-cell of extremity Right 01/29/2016    Procedure: APPLICATION OF A-CELL OF EXTREMITY;  Surgeon: Alena Bills Binyomin Brann, DO;  Location: [facility] OR;  Service: Plastics;  Laterality: Right;  . Application of wound vac Right 01/29/2016    Procedure: APPLICATION OF WOUND VAC;  Surgeon: Alena Bills Brecken Walth, DO;   Location: [facility] OR;  Service: Plastics;  Laterality: Right;    Family History  Problem Relation Age of Onset  . Hypertension Father   . Other Father     Pacemaker  . Parkinson's disease Father   . Colon cancer Neg Hx    Social History:  reports that he has never smoked. He has never used smokeless tobacco. He reports that he does not drink alcohol or use illicit drugs.  Allergies: No Known Allergies   (Not in a hospital admission)  No results found for this or any previous visit (from the past 48 hour(s)). No results found.  Review of Systems  Constitutional: Negative.   HENT: Negative.   Eyes: Negative.   Respiratory: Negative.   Cardiovascular: Negative.   Gastrointestinal: Negative.   Genitourinary: Negative.   Musculoskeletal: Negative.   Skin: Negative.   Neurological: Negative.   Psychiatric/Behavioral: Negative.     There were no vitals taken for this visit. Physical Exam  Constitutional: He is oriented to person, place, and time. He appears well-developed and well-nourished.  HENT:  Head: Normocephalic and atraumatic.  Eyes: Conjunctivae and EOM are normal. Pupils are equal, round, and reactive to light.  Cardiovascular: Normal rate.   Respiratory: Effort normal. No respiratory distress.  GI: Soft. He exhibits no distension.  Musculoskeletal: He exhibits edema and tenderness.  Neurological: He is alert and oriented to person, place, and time.  Skin: Skin is warm.  Psychiatric: He has a normal mood and affect. His behavior is normal. Judgment and thought content normal.     Assessment/Plan Plan for right leg wound split thickness skin graft and placement of VAC and Acell on the donor site.  Peggye Form, DO 04/05/2016, 7:41 PM

## 2016-04-07 NOTE — Brief Op Note (Signed)
04/07/2016  10:46 AM  PATIENT:  Gemma Payor  55 y.o. male  PRE-OPERATIVE DIAGNOSIS:  RIGHT LOWER LEG  WOUND  POST-OPERATIVE DIAGNOSIS:  same  PROCEDURE:  Procedure(s): SKIN GRAFT SPLIT THICKNESS TO RIGHT LOWER LEG (Right) APPLICATION OF A-CELL OF EXTREMITY AND BACK DRESSING CHANGE (Right)  SURGEON:  Surgeon(s) and Role:    * Alena Bills Dillingham, DO - Primary  PHYSICIAN ASSISTANT: Shawn Rayburn, PA  ASSISTANTS: none   ANESTHESIA:   general  EBL:     BLOOD ADMINISTERED:none  DRAINS: none   LOCAL MEDICATIONS USED:  MARCAINE     SPECIMEN:  No Specimen  DISPOSITION OF SPECIMEN:  N/A  COUNTS:  YES  TOURNIQUET:  * No tourniquets in log *  DICTATION: .Dragon Dictation  PLAN OF CARE: Discharge to home after PACU  PATIENT DISPOSITION:  PACU - hemodynamically stable.   Delay start of Pharmacological VTE agent (>24hrs) due to surgical blood loss or risk of bleeding: no

## 2016-04-07 NOTE — Progress Notes (Signed)
Orthopedic Tech Progress Note Patient Details:  Vincent Brown 1961/03/16 [id number]  Ortho Devices Type of Ortho Device: Crutches Ortho Device/Splint Interventions: Application   Nikki Dom 04/07/2016, 12:28 PM

## 2016-04-07 NOTE — Op Note (Signed)
Operative Note   DATE OF OPERATION: 04/07/2016  LOCATION: [facility]  SURGICAL DIVISION: Plastic Surgery  PREOPERATIVE DIAGNOSES:  Right leg ulcer 7 x 16 cm  POSTOPERATIVE DIAGNOSES:  same  PROCEDURE:   1. Placement of split thickness skin graft to right leg wound 150 cm2 and placement of VAC. 2. Placement of Acell (10 x 15 cm sheet) on the donor site.   SURGEON: Wayland Denis, DO  ASSISTANT: Shawn Rayburn, PA  ANESTHESIA:  General.   COMPLICATIONS: None.   INDICATIONS FOR PROCEDURE:  The patient, Vincent Brown is a 55 y.o. male born on 1961-03-02, is here for treatment of a chronic right leg wound that the patient has been dealing with for the past year.  He underwent debridement and acell placement in the past and returns for further treatment. MRN: [id number]  CONSENT:  Informed consent was obtained directly from the patient. Risks, benefits and alternatives were fully discussed. Specific risks including but not limited to bleeding, infection, hematoma, seroma, scarring, pain, infection, contracture, asymmetry, wound healing problems, and need for further surgery were all discussed. The patient did have an ample opportunity to have questions answered to satisfaction.   DESCRIPTION OF PROCEDURE:  The patient was taken to the operating room. SCD was placed on the left leg and IV antibiotics were given. The patient's operative site was prepped and draped in a sterile fashion. A time out was performed and all information was confirmed to be correct.  General anesthesia was administered.  The wound of the right leg (7 x 16 cm) was irrigated with antibiotic and saline solution.  The skin edges were debrided sharply with a #10 blade and the curette used at the base of the wound.  The dermatome was set at 10/999 inches and a split thickness skin graft was obtained from the right thigh.  All of the Acell sheet 10 x 15 cm was placed and secured with 5-0 Vicryl.  A sorbact was  placed on top and secured with a 4-0 Silk.  The KY was placed with gauze and an ABD.  The graft was then secured to the wound 150 cm2 with 5-0 Vicryl.  The sorbact was applied over the graft.  The VAC was applied and had an excellent seal.  The leg was wrapped with a kerlex.  A splint was placed and secured with an Ace. The patient tolerated the procedure well.  There were no complications. The patient was allowed to wake from anesthesia, extubated and taken to the recovery room in satisfactory condition.

## 2016-04-07 NOTE — Anesthesia Preprocedure Evaluation (Deleted)
Anesthesia Evaluation  Patient identified by MRN, date of birth, ID band Patient awake    Reviewed: Allergy & Precautions, NPO status , Patient's Chart, lab work & pertinent test results  History of Anesthesia Complications Negative for: history of anesthetic complications  Airway Mallampati: II  TM Distance: >3 FB Neck ROM: Full    Dental no notable dental hx.    Pulmonary shortness of breath and with exertion,    breath sounds clear to auscultation       Cardiovascular hypertension, Pt. on medications and Pt. on home beta blockers + CAD, + Past MI, + Peripheral Vascular Disease, +CHF and + DOE   Rhythm:Regular Rate:Normal  10/2015 Cath: Assessment: 1. Severe 3v CAD as above with patent LIMA to LAD and patent SVG to OM 2. SVG to RPDA is occluded but there are good L->R and R->R collaterals 3. EF ~ 20% by echo and nuclear test 4. Mild to moderately elevated L-sided filling pressures with normal cardiac output   Neuro/Psych  Headaches,    GI/Hepatic negative GI ROS, Neg liver ROS,   Endo/Other  diabetes, Type 2, Oral Hypoglycemic Agents  Renal/GU Renal InsufficiencyRenal disease     Musculoskeletal   Abdominal   Peds  Hematology  (+) anemia ,   Anesthesia Other Findings   Reproductive/Obstetrics                            Lab Results  Component Value Date   WBC 4.6 02/23/2016   HGB 10.0* 02/23/2016   HCT 31.2* 02/23/2016   MCV 86.6 02/23/2016   PLT 204.0 02/23/2016   Lab Results  Component Value Date   CREATININE 1.35* 02/19/2016   BUN 30* 02/19/2016   NA 139 02/19/2016   K 4.6 02/19/2016   CL 102 02/19/2016   CO2 26 02/19/2016    Anesthesia Physical  Anesthesia Plan  ASA: III  Anesthesia Plan: General   Post-op Pain Management:    Induction: Intravenous  Airway Management Planned: LMA  Additional Equipment: None and Arterial line  Intra-op Plan:    Post-operative Plan: Extubation in OR  Informed Consent: I have reviewed the patients History and Physical, chart, labs and discussed the procedure including the risks, benefits and alternatives for the proposed anesthesia with the patient or authorized representative who has indicated his/her understanding and acceptance.   Dental advisory given  Plan Discussed with: CRNA  Anesthesia Plan Comments:                                           Anesthesia Quick Evaluation

## 2016-04-07 NOTE — Anesthesia Procedure Notes (Signed)
Procedure Name: LMA Insertion Date/Time: 04/07/2016 10:57 AM Performed by: Tillman Abide Pre-anesthesia Checklist: Patient identified, Emergency Drugs available, Suction available and Patient being monitored Patient Re-evaluated:Patient Re-evaluated prior to inductionOxygen Delivery Method: Circle System Utilized Preoxygenation: Pre-oxygenation with 100% oxygen Intubation Type: IV induction Ventilation: Mask ventilation without difficulty LMA: LMA inserted LMA Size: 4.0 Number of attempts: 1 Placement Confirmation: positive ETCO2 and breath sounds checked- equal and bilateral Tube secured with: Tape Dental Injury: Teeth and Oropharynx as per pre-operative assessment

## 2016-04-07 NOTE — Transfer of Care (Signed)
Immediate Anesthesia Transfer of Care Note  Patient: Vincent Brown  Procedure(s) Performed: Procedure(s): SKIN GRAFT SPLIT THICKNESS TO RIGHT LOWER LEG (Right) APPLICATION OF A-CELL OF EXTREMITY AND VAC DRESSING CHANGE (Right)  Patient Location: PACU  Anesthesia Type:General  Level of Consciousness: awake, alert  and patient cooperative  Airway & Oxygen Therapy: Patient Spontanous Breathing  Post-op Assessment: Report given to RN and Post -op Vital signs reviewed and stable  Post vital signs: stable  Last Vitals:  Filed Vitals:   04/07/16 0909  BP: 94/63  Pulse: 90  Temp: 36.3 C  Resp: 18    Last Pain: There were no vitals filed for this visit.    Patients Stated Pain Goal: 3 (04/07/16 0947)  Complications: No apparent anesthesia complications

## 2016-04-07 NOTE — Interval H&P Note (Signed)
History and Physical Interval Note:  04/07/2016 8:28 AM  Vincent Brown  has presented today for surgery, with the diagnosis of RIGHT LOWER LEG  WOUND  The various methods of treatment have been discussed with the patient and family. After consideration of risks, benefits and other options for treatment, the patient has consented to  Procedure(s): SKIN GRAFT SPLIT THICKNESS TO RIGHT LOWER LEG (Right) APPLICATION OF A-CELL OF EXTREMITY AND BACK DRESSING CHANGE (Right) as a surgical intervention .  The patient's history has been reviewed, patient examined, no change in status, stable for surgery.  I have reviewed the patient's chart and labs.  Questions were answered to the patient's satisfaction.     Peggye Form

## 2016-04-07 NOTE — Progress Notes (Signed)
CBG=56. Pt alert and oriented, reports drinking 6 oz of apple juice at 0715 for CBG of 60 at home. Dr. Glade Stanford called and informed with orders to give 1/2 amp IV D50, will monitor.

## 2016-04-08 ENCOUNTER — Encounter (HOSPITAL_COMMUNITY): Payer: Self-pay | Admitting: Plastic Surgery

## 2016-04-08 LAB — HEMOGLOBIN A1C
HEMOGLOBIN A1C: 5.6 % (ref 4.8–5.6)
MEAN PLASMA GLUCOSE: 114 mg/dL

## 2016-05-03 ENCOUNTER — Other Ambulatory Visit: Payer: Self-pay | Admitting: Nurse Practitioner

## 2016-05-27 ENCOUNTER — Telehealth: Payer: Self-pay | Admitting: *Deleted

## 2016-05-27 NOTE — Telephone Encounter (Signed)
Sent release of information on 12-11-2015. I called several times for the records and finally received them 05-17-2016. Colonoscopy report , dated 10-30-2012. No pathology report. [facility].

## 2016-06-01 ENCOUNTER — Other Ambulatory Visit (HOSPITAL_COMMUNITY): Payer: Self-pay | Admitting: Internal Medicine

## 2016-06-04 ENCOUNTER — Other Ambulatory Visit (HOSPITAL_COMMUNITY): Payer: Self-pay | Admitting: Internal Medicine

## 2016-12-08 ENCOUNTER — Other Ambulatory Visit (HOSPITAL_COMMUNITY): Payer: Self-pay | Admitting: Internal Medicine

## 2017-01-05 ENCOUNTER — Other Ambulatory Visit (HOSPITAL_COMMUNITY): Payer: Self-pay | Admitting: Internal Medicine

## 2017-01-18 ENCOUNTER — Other Ambulatory Visit (HOSPITAL_COMMUNITY): Payer: Self-pay | Admitting: Internal Medicine

## 2017-02-08 ENCOUNTER — Other Ambulatory Visit (HOSPITAL_COMMUNITY): Payer: Self-pay | Admitting: Internal Medicine

## 2018-09-06 ENCOUNTER — Other Ambulatory Visit: Payer: Self-pay

## 2018-09-06 ENCOUNTER — Encounter (HOSPITAL_COMMUNITY): Payer: Self-pay | Admitting: Internal Medicine

## 2018-09-06 ENCOUNTER — Ambulatory Visit (HOSPITAL_COMMUNITY)
Admission: RE | Admit: 2018-09-06 | Discharge: 2018-09-06 | Disposition: A | Discharge status: Home / Self Care | Payer: Commercial Managed Care - PPO | Source: Ambulatory Visit | Attending: Internal Medicine | Admitting: Internal Medicine

## 2018-09-06 VITALS — BP 102/63 | HR 83 | Wt 164.4 lb

## 2018-09-06 DIAGNOSIS — I130 Hypertensive heart and chronic kidney disease with heart failure and stage 1 through stage 4 chronic kidney disease, or unspecified chronic kidney disease: Secondary | ICD-10-CM | POA: Diagnosis not present

## 2018-09-06 DIAGNOSIS — Z9889 Other specified postprocedural states: Secondary | ICD-10-CM | POA: Insufficient documentation

## 2018-09-06 DIAGNOSIS — R42 Dizziness and giddiness: Secondary | ICD-10-CM | POA: Insufficient documentation

## 2018-09-06 DIAGNOSIS — I2510 Atherosclerotic heart disease of native coronary artery without angina pectoris: Secondary | ICD-10-CM | POA: Insufficient documentation

## 2018-09-06 DIAGNOSIS — I5022 Chronic systolic (congestive) heart failure: Secondary | ICD-10-CM | POA: Insufficient documentation

## 2018-09-06 DIAGNOSIS — Z951 Presence of aortocoronary bypass graft: Secondary | ICD-10-CM | POA: Diagnosis not present

## 2018-09-06 DIAGNOSIS — N182 Chronic kidney disease, stage 2 (mild): Secondary | ICD-10-CM | POA: Insufficient documentation

## 2018-09-06 DIAGNOSIS — I872 Venous insufficiency (chronic) (peripheral): Secondary | ICD-10-CM | POA: Diagnosis not present

## 2018-09-06 DIAGNOSIS — Z79899 Other long term (current) drug therapy: Secondary | ICD-10-CM | POA: Insufficient documentation

## 2018-09-06 DIAGNOSIS — E1122 Type 2 diabetes mellitus with diabetic chronic kidney disease: Secondary | ICD-10-CM | POA: Insufficient documentation

## 2018-09-06 DIAGNOSIS — E785 Hyperlipidemia, unspecified: Secondary | ICD-10-CM | POA: Diagnosis not present

## 2018-09-06 DIAGNOSIS — Z8249 Family history of ischemic heart disease and other diseases of the circulatory system: Secondary | ICD-10-CM | POA: Diagnosis not present

## 2018-09-06 MED ORDER — ASPIRIN EC 81 MG PO TBEC: 81.0000 mg | DELAYED_RELEASE_TABLET | Freq: Every day | ORAL | 3 refills | Status: DC

## 2018-09-06 MED ORDER — EMPAGLIFLOZIN 10 MG PO TABS: 10.0000 mg | ORAL_TABLET | Freq: Every day | ORAL | 3 refills | Status: DC

## 2018-09-06 NOTE — Progress Notes (Signed)
Patient ID: Vincent Brown, male   DOB: May 07, 1961, 57 y.o.   MRN: [id number]    [facility]  Date:  09/06/2018  Patient ID:  Brown, Vincent 03/31/1961, MRN [id number] PCP:  Dorothey Baseman, MD  Cardiologist:  DBensimhon (CHF clinic)    Subjective: Vincent Brown is a 57 y.o. 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  Had a NSTEMI in January 2012 after presenting with bad cough. Had cath at [facility]. LAD 100% LCX 70% RCA p70%, d100%. EF 25%. Cardiac MRI: EF 15% anterior wall and inferior wall infarct with significant viability.  Unable to tolerate lisinopril due to dizziness. Spironolactone stopped due to hyperkalemia.   He was found to have large RLE chronic wound and MRI was suggestive of osteomyelitis. Vascular was consulted and felt AKA was the best option but the patient was not willing to have this at this time. Plastic surgery was consulted and performed debridement/wound vac. This is since being followed by ID and plastics. Although EF has remained historically low, the patient has deferred ICD in the past.   Today he returns for HF follow up. He has not been seen since 2017.  He was seen by his PCP a few months ago. At that time he was started on losartan however Tamel did not start. In the past he was not able to tolerate due to hypotension. Overall feeling fine. LE wound has healed.  Denies SOB/PND/Orthopnea. Appetite ok. No fever or chills. Weight at home 160 -163  pounds. He is only taking lasix once a week. Taking all medications.Works full time at [employer]   Lab work from MD office  04/2018 Hgb 12.4 Creatinine 1.1 K 5.    ECHO 12/2011: EF 25-30% ECHO 03/14/13 EF 25% ECHO 12/15: EF 25%, diffuse hypokinesis,  normal RV size, mildly decreased RV systolic function, PA systolic pressure 40 mmHg    Past Medical History:  Diagnosis Date  . Anemia    a. 10/2015: symptomatic; requiring transfusion, EGD with antral gastritis. Anemia panel more suggestive of AOCD.  Marland Kitchen Bacteroides fragilis 11/19/2015  . Chronic osteomyelitis of right tibia (HCC) 11/19/2015  . Chronic systolic CHF (congestive heart failure) (HCC)    a. unable to tolerate lisinopril/ARB due to dizziness.   . CKD (chronic kidney disease), stage II   . Coronary artery disease 2012   a. s/p CABG - LIMA - LAD, SVG-OM2, SVG-PDA (11/2010). b. Abnl nuc 09/2015 - R/LHC 10/2015  R/LHC showed patent LIMA-LAD, and SVG-OM2, but occluded SVG-PDA; right heart pressures slightly elevated, otherwise no significant findings.  . Diabetes mellitus 2003  . Essential hypertension   . Gastritis   . Group B streptococcal infection 11/19/2015  . Headache   . Hyperlipidemia   . Ischemic cardiomyopathy   . Myocardial infarction (HCC)   . Proliferative retinopathy 2015   treated with injection of Avastin.   . Pseudomonas aeruginosa infection 11/19/2015  . Serratia infection 11/19/2015  . Shingles 2003  . Shortness of breath dyspnea   . Venous insufficiency    a. severe venous insufficiency with previous R leg ulcers requiring vein stripping.    Past Surgical History:  Procedure Laterality Date  . APPENDECTOMY  1971  . APPLICATION OF A-CELL OF EXTREMITY Right 10/24/2015  Procedure: APPLICATION OF A-CELL AND PLACEMENT OF WOUND VAC;  Surgeon: Alena Bills Dillingham, DO;  [facility]: [facility];  Service: Plastics;  Laterality: Right;  . APPLICATION OF A-CELL OF EXTREMITY Right 01/29/2016   Procedure: APPLICATION OF A-CELL OF EXTREMITY;  Surgeon: Alena Bills Dillingham, DO;  Location: [facility];  Service: Plastics;  Laterality: Right;  . APPLICATION OF A-CELL OF EXTREMITY Right 04/07/2016   Procedure: APPLICATION OF A-CELL OF EXTREMITY AND VAC DRESSING CHANGE;  Surgeon: Alena Bills  Dillingham, DO;  Location: [facility];  Service: Plastics;  Laterality: Right;  . APPLICATION OF WOUND VAC Right 01/29/2016   Procedure: APPLICATION OF WOUND VAC;  Surgeon: Alena Bills Dillingham, DO;  Location: [facility];  Service: Plastics;  Laterality: Right;  . CARDIAC CATHETERIZATION N/A 10/22/2015   Procedure: Right/Left Heart Cath and Coronary/Graft Angiography;  Surgeon: Dolores Patty, MD;  Location: [facility] INVASIVE CV LAB;  Service: Cardiovascular;  Laterality: N/A;  . COLONOSCOPY  10/2012   Dr Silver Huguenin in [place]. mall sigmoid tics, small internal hemorrhoids.  otherwise normal screening study.  no polyps, telangectasia...  . CORONARY ARTERY BYPASS GRAFT  11/05/2010  . ESOPHAGOGASTRODUODENOSCOPY (EGD) WITH PROPOFOL N/A 10/21/2015   Procedure: ESOPHAGOGASTRODUODENOSCOPY (EGD) WITH PROPOFOL;  Surgeon: Napoleon Form, MD;  [facility]: [facility];  Service: Endoscopy;  Laterality: N/A;  . FRACTURE SURGERY      wrist  . I&D EXTREMITY Right 10/24/2015   Procedure: IRRIGATION AND DEBRIDEMENT EXTREMITY;  Surgeon: Alena Bills Dillingham, DO;  [facility]: [facility];  Service: Plastics;  Laterality: Right;  . I&D EXTREMITY Right 01/29/2016   Procedure: RIGHT LEG WOUND IRRIGATION AND DEBRIDEMENT ;  Surgeon: Alena Bills Dillingham, DO;  [facility]: [facility];  Service: Plastics;  Laterality: Right;  . REFRACTIVE SURGERY  2009, 2015  . SKIN SPLIT GRAFT Right 04/07/2016   Procedure: SKIN GRAFT SPLIT THICKNESS TO RIGHT LOWER LEG;  Surgeon: Alena Bills Dillingham, DO;  [facility];  Service: Plastics;  Laterality: Right;  Marland Kitchen VARICOSE VEIN SURGERY  1995   right lower extremity    Current Outpatient Medications  Medication Sig Dispense Refill  . Acetaminophen 500 MG coapsule Take 500 mg by mouth every 4 (four) hours as needed for pain. Reported on 11/19/2015    . ALPRAZolam (XANAX) 0.25 MG tablet Take 1 tablet (0.25 mg total) by mouth 2 (two) times daily as needed for anxiety or sleep. 20 tablet 0  . ascorbic acid (VITAMIN  C) 1000 MG tablet Take 1,000 mg by mouth 2 (two) times daily.    Marland Kitchen atorvastatin (LIPITOR) 20 MG tablet Take 20 mg by mouth daily.    . cyclobenzaprine (FLEXERIL) 10 MG tablet Take 10 mg by mouth at bedtime as needed for muscle spasms.     . furosemide (LASIX) 80 MG tablet take 1 tablet by mouth once daily 30 tablet 3  . glimepiride (AMARYL) 4 MG tablet Take 4 mg by mouth 2 (two) times daily before a meal.     . metoprolol succinate (TOPROL-XL) 50 MG 24 hr tablet take 1 1/2 tablet by mouth once daily 45 tablet 6  . metoprolol succinate (TOPROL-XL) 50 MG 24 hr tablet TAKE 1 AND 1/2 TABLET BY MOUTH EVERY DAY 135 tablet 3   No current facility-administered medications for this encounter.     Allergies:   Patient has no known allergies.   Social History:  The patient  reports that he has never smoked. He has never used smokeless tobacco. He reports that he does not  drink alcohol or use drugs.   Family History:  The patient's family history includes Hypertension in his father; Other in his father; Parkinson's disease in his father.  ROS:  Please see the history of present illness.    All other systems are reviewed and otherwise negative.   PHYSICAL EXAM:  VS:  BP 102/63   Pulse 83   Wt 74.6 kg (164 lb 6.4 oz)   SpO2 100%   BMI 22.93 kg/m  BMI: Body mass index is 22.93 kg/m. General:  Well appearing. No resp difficulty HEENT: normal anicteric Neck: supple. no JVD. Carotids 2+ bilat; no bruits. No lymphadenopathy or thryomegaly appreciated. Cor: PMI laterally displaced. Regular rate & rhythm. No rubs, gallops or murmurs. Lungs: clear no wheeze Abdomen: soft, nontender, nondistended. No hepatosplenomegaly. No bruits or masses. Good bowel sounds. Extremities: no cyanosis, clubbing, rash, LE with scar from wound. No edema Neuro: alert & orientedx3, cranial nerves grossly intact. moves all 4 extremities w/o difficulty. Affect pleasant   Recent Labs: No results found for requested labs  within last 8760 hours.  No results found for requested labs within last 8760 hours.   CrCl cannot be calculated (Patient's most recent lab result is older than the maximum 21 days allowed.).   Wt Readings from Last 3 Encounters:  09/06/18 74.6 kg (164 lb 6.4 oz)  04/07/16 67.1 kg (148 lb)  02/23/16 71.4 kg (157 lb 6 oz)     Other studies reviewed: Additional studies/records reviewed today include: summarized above  ASSESSMENT AND PLAN:  1. Chronic systolic CHF/ICM -iCM EF 15-20% (echo 12/16). Set up repeat ECHO  NYHA I. Volume status stable. He only takes lasix as needed. Start jardiance for CV benefit.  - On toprol 75 daily.  - He is intolerant losartan and lisinopril due to hypotension.  - Intolerant spironolactone due to hyperkalemia  - He has not been on digoxin in 6 months. Keep off for now.  - Has refuses ICD. 2. CAD s/p CABG as above - recent cath stable.  - Cath 12/16 - Severe 3v CAD as above with patent LIMA to LAD and patent SVG to OM - SVG to RPDA is occluded but there are good L->R and R->R collaterals - No s/s angina - Start 81 mg asa daily. Continue atorvastatin.  3. CKD stage II - recent creatinine 1.01 April 2018  4. Anemia - In the past he received iron but no longer needs. Most recent hgb 12.4 in June 2019  5. DMII- stop amaryl with addition of jardiance.   Set up ECHO in January or February.  Follow up in 1 year.   Amy Clegg NP-C 10:58 AM  Patient seen and examined with Tonye Becket, NP. We discussed all aspects of the encounter. I agree with the assessment and plan as stated above.   Continues to do well despite severe ischemic CM with EF 15-20%. PCP has suggested losartan which I agree with but unfortunately he has been unable to tolerate in past due to low BP. He is reluctant to start again. Given recent data and presence of DM2, will try Jardiance 10 mg daily. Can stop lasix as well. Denies any anginal symptoms. Restart ASA. Needs to f/u echo but wants to  wait until 2020 for insurance reasons.   Arvilla Meres, MD  1:07 PM

## 2018-09-06 NOTE — Patient Instructions (Signed)
STOP amaryl  START aspirin 81 mg (1 tab) daily  START Jardiance 10 mg (1 tab) daily  Your physician has requested that you have an echocardiogram. Echocardiography is a painless test that uses sound waves to create images of your heart. It provides your doctor with information about the size and shape of your heart and how well your heart's chambers and valves are working. This procedure takes approximately one hour. There are no restrictions for this procedure.   Follow up with Dr. Gala Romney in 1 year

## 2018-09-07 ENCOUNTER — Telehealth (HOSPITAL_COMMUNITY): Payer: Self-pay | Admitting: Surgery

## 2018-09-07 NOTE — Telephone Encounter (Signed)
I called patient in reference to concerns that he expressed during yesterday's appt in the [facility].  I left a message and requested that he call me back.

## 2018-10-05 ENCOUNTER — Other Ambulatory Visit (HOSPITAL_COMMUNITY): Payer: Self-pay | Admitting: Internal Medicine

## 2018-12-28 ENCOUNTER — Other Ambulatory Visit (HOSPITAL_COMMUNITY): Payer: Commercial Managed Care - PPO

## 2019-02-01 ENCOUNTER — Ambulatory Visit (HOSPITAL_COMMUNITY): Payer: Commercial Managed Care - PPO

## 2019-04-19 ENCOUNTER — Ambulatory Visit (HOSPITAL_COMMUNITY)
Admission: RE | Admit: 2019-04-19 | Discharge: 2019-04-19 | Disposition: A | Discharge status: Home / Self Care | Payer: Commercial Managed Care - PPO | Source: Ambulatory Visit | Attending: Internal Medicine | Admitting: Internal Medicine

## 2019-04-19 ENCOUNTER — Other Ambulatory Visit: Payer: Self-pay

## 2019-04-19 DIAGNOSIS — I5022 Chronic systolic (congestive) heart failure: Secondary | ICD-10-CM | POA: Insufficient documentation

## 2019-04-19 DIAGNOSIS — I080 Rheumatic disorders of both mitral and aortic valves: Secondary | ICD-10-CM | POA: Insufficient documentation

## 2019-04-19 DIAGNOSIS — E785 Hyperlipidemia, unspecified: Secondary | ICD-10-CM | POA: Diagnosis not present

## 2019-04-19 DIAGNOSIS — I2510 Atherosclerotic heart disease of native coronary artery without angina pectoris: Secondary | ICD-10-CM | POA: Diagnosis not present

## 2019-04-19 NOTE — Progress Notes (Signed)
  Echocardiogram 2D Echocardiogram has been performed.  Vincent Brown 04/19/2019, 3:20 PM

## 2019-10-22 ENCOUNTER — Encounter (HOSPITAL_COMMUNITY): Payer: Self-pay | Admitting: Internal Medicine

## 2019-10-22 ENCOUNTER — Other Ambulatory Visit: Payer: Self-pay

## 2019-10-22 ENCOUNTER — Ambulatory Visit (HOSPITAL_COMMUNITY)
Admission: RE | Admit: 2019-10-22 | Discharge: 2019-10-22 | Disposition: A | Discharge status: Home / Self Care | Payer: 59 | Source: Ambulatory Visit | Attending: Internal Medicine | Admitting: Internal Medicine

## 2019-10-22 VITALS — BP 86/58 | HR 78 | Wt 141.4 lb

## 2019-10-22 DIAGNOSIS — I2510 Atherosclerotic heart disease of native coronary artery without angina pectoris: Secondary | ICD-10-CM | POA: Insufficient documentation

## 2019-10-22 DIAGNOSIS — I872 Venous insufficiency (chronic) (peripheral): Secondary | ICD-10-CM | POA: Insufficient documentation

## 2019-10-22 DIAGNOSIS — I252 Old myocardial infarction: Secondary | ICD-10-CM | POA: Insufficient documentation

## 2019-10-22 DIAGNOSIS — E1122 Type 2 diabetes mellitus with diabetic chronic kidney disease: Secondary | ICD-10-CM | POA: Insufficient documentation

## 2019-10-22 DIAGNOSIS — Z79899 Other long term (current) drug therapy: Secondary | ICD-10-CM | POA: Diagnosis not present

## 2019-10-22 DIAGNOSIS — Z7982 Long term (current) use of aspirin: Secondary | ICD-10-CM | POA: Insufficient documentation

## 2019-10-22 DIAGNOSIS — N182 Chronic kidney disease, stage 2 (mild): Secondary | ICD-10-CM | POA: Insufficient documentation

## 2019-10-22 DIAGNOSIS — I255 Ischemic cardiomyopathy: Secondary | ICD-10-CM | POA: Diagnosis not present

## 2019-10-22 DIAGNOSIS — Z820 Family history of epilepsy and other diseases of the nervous system: Secondary | ICD-10-CM | POA: Diagnosis not present

## 2019-10-22 DIAGNOSIS — Z8249 Family history of ischemic heart disease and other diseases of the circulatory system: Secondary | ICD-10-CM | POA: Diagnosis not present

## 2019-10-22 DIAGNOSIS — R1310 Dysphagia, unspecified: Secondary | ICD-10-CM | POA: Insufficient documentation

## 2019-10-22 DIAGNOSIS — Z951 Presence of aortocoronary bypass graft: Secondary | ICD-10-CM | POA: Insufficient documentation

## 2019-10-22 DIAGNOSIS — I130 Hypertensive heart and chronic kidney disease with heart failure and stage 1 through stage 4 chronic kidney disease, or unspecified chronic kidney disease: Secondary | ICD-10-CM | POA: Insufficient documentation

## 2019-10-22 DIAGNOSIS — R1319 Other dysphagia: Secondary | ICD-10-CM

## 2019-10-22 DIAGNOSIS — I5022 Chronic systolic (congestive) heart failure: Secondary | ICD-10-CM | POA: Diagnosis not present

## 2019-10-22 DIAGNOSIS — Z7984 Long term (current) use of oral hypoglycemic drugs: Secondary | ICD-10-CM | POA: Diagnosis not present

## 2019-10-22 DIAGNOSIS — E785 Hyperlipidemia, unspecified: Secondary | ICD-10-CM | POA: Insufficient documentation

## 2019-10-22 LAB — LIPID PANEL
Cholesterol: 115 mg/dL (ref 0–200)
HDL: 36 mg/dL — ABNORMAL LOW (ref >40)
LDL Cholesterol: 71 mg/dL (ref 0–99)
Total CHOL/HDL Ratio: 3.2 RATIO
Triglycerides: 42 mg/dL (ref <150)
VLDL: 8 mg/dL (ref 0–40)

## 2019-10-22 LAB — COMPREHENSIVE METABOLIC PANEL
ALT: 17 U/L (ref 0–44)
AST: 16 U/L (ref 15–41)
Albumin: 3.5 g/dL (ref 3.5–5.0)
Alkaline Phosphatase: 62 U/L (ref 38–126)
Anion gap: 7 (ref 5–15)
BUN: 25 mg/dL — ABNORMAL HIGH (ref 6–20)
CO2: 28 mmol/L (ref 22–32)
Calcium: 9.2 mg/dL (ref 8.9–10.3)
Chloride: 104 mmol/L (ref 98–111)
Creatinine, Ser: 1 mg/dL (ref 0.61–1.24)
GFR calc Af Amer: >60 mL/min (ref >60)
GFR calc non Af Amer: >60 mL/min (ref >60)
Glucose, Bld: 102 mg/dL — ABNORMAL HIGH (ref 70–99)
Potassium: 4.6 mmol/L (ref 3.5–5.1)
Sodium: 139 mmol/L (ref 135–145)
Total Bilirubin: 0.4 mg/dL (ref 0.3–1.2)
Total Protein: 7.4 g/dL (ref 6.5–8.1)

## 2019-10-22 LAB — CBC
HCT: 35.7 % — ABNORMAL LOW (ref 39.0–52.0)
Hemoglobin: 11.2 g/dL — ABNORMAL LOW (ref 13.0–17.0)
MCH: 29.6 pg (ref 26.0–34.0)
MCHC: 31.4 g/dL (ref 30.0–36.0)
MCV: 94.4 fL (ref 80.0–100.0)
Platelets: 168 10*3/uL (ref 150–400)
RBC: 3.78 MIL/uL — ABNORMAL LOW (ref 4.22–5.81)
RDW: 12.3 % (ref 11.5–15.5)
WBC: 6.5 10*3/uL (ref 4.0–10.5)
nRBC: 0 % (ref 0.0–0.2)

## 2019-10-22 LAB — BRAIN NATRIURETIC PEPTIDE: B Natriuretic Peptide: 291.3 pg/mL — ABNORMAL HIGH (ref 0.0–100.0)

## 2019-10-22 NOTE — Patient Instructions (Signed)
Labs done today, we will notify you for abnormal results  Please call our office in May to schedule follow up appointment for June 2021  If you have any questions or concerns before your next appointment please send [place] a message through [place] or call our office at [phone].  At the [facility], you and your health needs are our priority. As part of our continuing mission to provide you with exceptional heart care, we have created designated Provider Care Teams. These Care Teams include your primary Cardiologist (physician) and Advanced Practice Providers (APPs- Physician Assistants and Nurse Practitioners) who all work together to provide you with the care you need, when you need it.   You may see any of the following providers on your designated Care Team at your next follow up: Marland Kitchen Dr Glori Bickers . Dr Loralie Champagne . Darrick Grinder, NP . Lyda Jester, PA . Audry Riles, PharmD   Please be sure to bring in all your medications bottles to every appointment.

## 2019-10-22 NOTE — Progress Notes (Addendum)
Patient ID: Vincent Brown, male   DOB: 25-Feb-1961, 58 y.o.   MRN: [id number]    [facility]  Date:  10/22/2019  Patient ID:  Vincent, Brown Feb 11, 1961, MRN [id number] PCP:  Dorothey Baseman, MD  Cardiologist:  DBensimhon (CHF clinic)    Subjective: [name] is a 58 y.o. 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  Had a NSTEMI in January 2012 after presenting with bad cough. Had cath at [facility]. LAD 100% LCX 70% RCA p70%, d100%. EF 25%. Cardiac MRI: EF 15% anterior wall and inferior wall infarct with significant viability.  Unable to tolerate lisinopril or losartan due to dizziness. Spironolactone stopped due to hyperkalemia.   He was found to have large RLE chronic wound and MRI was suggestive of osteomyelitis. Vascular was consulted and felt AKA was the best option but the patient was not willing to have this at this time. Plastic surgery was consulted and performed debridement/wound vac. Healed completely. Although EF has remained historically low, the patient has deferred ICD in the past.    Echo 6/20 EF 20-25% mild RV dysfunction Personally reviewed   Today he returns for HF follow up. He only agrees to limited visits. We have not seen him for 1 year. Last appt Jardiance added.  Says it didn't work out. Says sugars went up and PCP restarted glyburide. 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 Says overall doing pretty good. Denies SOB if takes his time, orthopnea or PND. No edema. 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 As long as he goes at his own pace he is OK but if tries to keep up with other people gets winded Says he has a hard time swallowing. Feels like food gets stuck. BP runs low. No dizziness or  syncope. Has lost about 20 pounds.   Lab work from MD office  04/2018 Hgb 12.4 Creatinine 1.1 K 5.    ECHO 12/2011: EF 25-30% ECHO 03/14/13 EF 25% ECHO 12/15: EF 25%, diffuse hypokinesis, normal RV size, mildly decreased RV systolic function, PA systolic pressure 40 mmHg    Past Medical History:  Diagnosis Date  . Anemia    a. 10/2015: symptomatic; requiring transfusion, EGD with antral gastritis. Anemia panel more suggestive of AOCD.  Marland Kitchen Bacteroides fragilis 11/19/2015  . Chronic osteomyelitis of right tibia (HCC) 11/19/2015  . Chronic systolic CHF (congestive heart failure) (HCC)    a. unable to tolerate lisinopril/ARB due to dizziness.   . CKD (chronic kidney disease), stage II   . Coronary artery disease 2012   a. s/p CABG - LIMA - LAD, SVG-OM2, SVG-PDA (11/2010). b. Abnl nuc 09/2015 - R/LHC 10/2015  R/LHC showed patent LIMA-LAD, and SVG-OM2, but occluded SVG-PDA; right heart pressures slightly elevated, otherwise no significant findings.  . Diabetes mellitus 2003  . Essential hypertension   . Gastritis   . Group B streptococcal infection 11/19/2015  . Headache   . Hyperlipidemia   . Ischemic cardiomyopathy   . Myocardial infarction (HCC)   . Proliferative retinopathy 2015   treated with injection of Avastin.   . Pseudomonas aeruginosa infection 11/19/2015  . Serratia infection 11/19/2015  . Shingles 2003  . Shortness of breath dyspnea   . Venous insufficiency  a. severe venous insufficiency with previous R leg ulcers requiring vein stripping.    Past Surgical History:  Procedure Laterality Date  . APPENDECTOMY  1971  . APPLICATION OF A-CELL OF EXTREMITY Right 10/24/2015   Procedure: APPLICATION OF A-CELL AND PLACEMENT OF WOUND VAC;  Surgeon: Alena Bills Dillingham, DO;  [facility]: [facility];  Service: Plastics;  Laterality: Right;  . APPLICATION OF A-CELL OF EXTREMITY Right 01/29/2016   Procedure: APPLICATION OF A-CELL OF EXTREMITY;  Surgeon: Alena Bills Dillingham, DO;  Location: [facility]  OR;  Service: Plastics;  Laterality: Right;  . APPLICATION OF A-CELL OF EXTREMITY Right 04/07/2016   Procedure: APPLICATION OF A-CELL OF EXTREMITY AND VAC DRESSING CHANGE;  Surgeon: Alena Bills Dillingham, DO;  Location: [facility];  Service: Plastics;  Laterality: Right;  . APPLICATION OF WOUND VAC Right 01/29/2016   Procedure: APPLICATION OF WOUND VAC;  Surgeon: Alena Bills Dillingham, DO;  [facility]: [facility];  Service: Plastics;  Laterality: Right;  . CARDIAC CATHETERIZATION N/A 10/22/2015   Procedure: Right/Left Heart Cath and Coronary/Graft Angiography;  Surgeon: Dolores Patty, MD;  Location: [facility] INVASIVE CV LAB;  Service: Cardiovascular;  Laterality: N/A;  . COLONOSCOPY  10/2012   Dr Silver Huguenin in [place]. mall sigmoid tics, small internal hemorrhoids.  otherwise normal screening study.  no polyps, telangectasia...  . CORONARY ARTERY BYPASS GRAFT  11/05/2010  . ESOPHAGOGASTRODUODENOSCOPY (EGD) WITH PROPOFOL N/A 10/21/2015   Procedure: ESOPHAGOGASTRODUODENOSCOPY (EGD) WITH PROPOFOL;  Surgeon: Napoleon Form, MD;  [facility];  Service: Endoscopy;  Laterality: N/A;  . FRACTURE SURGERY      wrist  . I & D EXTREMITY Right 10/24/2015   Procedure: IRRIGATION AND DEBRIDEMENT EXTREMITY;  Surgeon: Alena Bills Dillingham, DO;  [facility];  Service: Plastics;  Laterality: Right;  . I & D EXTREMITY Right 01/29/2016   Procedure: RIGHT LEG WOUND IRRIGATION AND DEBRIDEMENT ;  Surgeon: Alena Bills Dillingham, DO;  [facility]: [facility];  Service: Plastics;  Laterality: Right;  . REFRACTIVE SURGERY  2009, 2015  . SKIN SPLIT GRAFT Right 04/07/2016   Procedure: SKIN GRAFT SPLIT THICKNESS TO RIGHT LOWER LEG;  Surgeon: Alena Bills Dillingham, DO;  [facility]: [facility];  Service: Plastics;  Laterality: Right;  Marland Kitchen VARICOSE VEIN SURGERY  1995   right lower extremity    Current Outpatient Medications  Medication Sig Dispense Refill  . Acetaminophen 500 MG coapsule Take 500 mg by mouth every 4 (four) hours as needed for  pain. Reported on 11/19/2015    . ALPRAZolam (XANAX) 0.25 MG tablet Take 1 tablet (0.25 mg total) by mouth 2 (two) times daily as needed for anxiety or sleep. 20 tablet 0  . ascorbic acid (VITAMIN C) 1000 MG tablet Take 1,000 mg by mouth 2 (two) times daily.    Marland Kitchen aspirin EC 81 MG tablet Take 1 tablet (81 mg total) by mouth daily. 90 tablet 3  . atorvastatin (LIPITOR) 20 MG tablet Take 20 mg by mouth daily.    . cyclobenzaprine (FLEXERIL) 10 MG tablet Take 10 mg by mouth at bedtime as needed for muscle spasms.     . digoxin (LANOXIN) 0.125 MG tablet Take by mouth.    . furosemide (LASIX) 80 MG tablet take 1 tablet by mouth once daily (Patient taking differently: Take 80 mg by mouth as needed. ) 30 tablet 3  . glimepiride (AMARYL) 4 MG tablet Take by mouth.    . metoprolol succinate (TOPROL-XL) 50 MG 24 hr tablet take 1 1/2 tablet by mouth  once daily 45 tablet 6   No current facility-administered medications for this encounter.    Allergies:   Patient has no known allergies.   Social History:  The patient  reports that he has never smoked. He has never used smokeless tobacco. He reports that he does not drink alcohol or use drugs.   Family History:  The patient's family history includes Hypertension in his father; Other in his father; Parkinson's disease in his father.  ROS:  Please see the history of present illness.    All other systems are reviewed and otherwise negative.   PHYSICAL EXAM:  VS:  BP (!) 86/58   Pulse 78   Wt 64.1 kg (141 lb 6.4 oz)   SpO2 97%   BMI 19.72 kg/m  BMI: Body mass index is 19.72 kg/m. General:  Weak appearing. Thin. No resp difficulty HEENT: normal Neck: supple. no JVD. Carotids 2+ bilat; no bruits. No lymphadenopathy or thryomegaly appreciated. Cor: PMI nondisplaced. Regular rate & rhythm. No rubs, gallops or murmurs. Lungs: clear Abdomen: soft, nontender, nondistended. No hepatosplenomegaly. No bruits or masses. Good bowel sounds. Extremities: no  cyanosis, clubbing, rash, edema RLE with healed wound Neuro: alert & orientedx3, cranial nerves grossly intact. moves all 4 extremities w/o difficulty. Affect pleasant  ECG: NSR 75 LVH and repol Personally reviewed   Recent Labs: No results found for requested labs within last 8760 hours.  No results found for requested labs within last 8760 hours.   CrCl cannot be calculated (Patient's most recent lab result is older than the maximum 21 days allowed.).   Wt Readings from Last 3 Encounters:  10/22/19 64.1 kg (141 lb 6.4 oz)  09/06/18 74.6 kg (164 lb 6.4 oz)  04/07/16 67.1 kg (148 lb)     Other studies reviewed: Additional studies/records reviewed today include: summarized above  ASSESSMENT AND PLAN:  1. Chronic systolic CHF/ICM  -iCM EF 15-20% (echo 12/16). - Echo 6/20 EF 20-25% - NYHA II-III - On toprol 75 daily, digoxin 0.125  - He is intolerant losartan and lisinopril due to hypotension.  - Intolerant spironolactone due to hyperkalemia  - We started Jardiance at last visit but he said sgar went up so PCP changed back. I tried to explain that CV benefit indepenedent of glycemic control but he refused  - Has refuses ICD again - I remain concerned about him but he refuses more aggressive management  - Labs today 2. CAD s/p CABG as above - Cath 12/16 - Severe 3v CAD as above with patent LIMA to LAD and patent SVG to OM - SVG to RPDA is occluded but there are good L->R and R->R collaterals - No s/s ischemia currently - Continue ASA/statin 3. DMII - followed by PCP. Refuses SGLT2i as above 4. Dysphagia - I offered barium swallow but he refused   RTC in 6 months. Sooner if clinically indicated.   Glori Bickers MD 1:20 PM

## 2019-12-10 DIAGNOSIS — I10 Essential (primary) hypertension: Secondary | ICD-10-CM | POA: Diagnosis not present

## 2019-12-10 DIAGNOSIS — I25810 Atherosclerosis of coronary artery bypass graft(s) without angina pectoris: Secondary | ICD-10-CM | POA: Diagnosis not present

## 2019-12-10 DIAGNOSIS — Z125 Encounter for screening for malignant neoplasm of prostate: Secondary | ICD-10-CM | POA: Diagnosis not present

## 2019-12-10 DIAGNOSIS — E119 Type 2 diabetes mellitus without complications: Secondary | ICD-10-CM | POA: Diagnosis not present

## 2019-12-10 DIAGNOSIS — Z0000 Encounter for general adult medical examination without abnormal findings: Secondary | ICD-10-CM | POA: Diagnosis not present

## 2020-06-09 DIAGNOSIS — I10 Essential (primary) hypertension: Secondary | ICD-10-CM | POA: Diagnosis not present

## 2020-06-09 DIAGNOSIS — E785 Hyperlipidemia, unspecified: Secondary | ICD-10-CM | POA: Diagnosis not present

## 2020-06-09 DIAGNOSIS — I25810 Atherosclerosis of coronary artery bypass graft(s) without angina pectoris: Secondary | ICD-10-CM | POA: Diagnosis not present

## 2020-06-09 DIAGNOSIS — E119 Type 2 diabetes mellitus without complications: Secondary | ICD-10-CM | POA: Diagnosis not present

## 2020-07-18 DIAGNOSIS — H4311 Vitreous hemorrhage, right eye: Secondary | ICD-10-CM | POA: Diagnosis not present

## 2020-07-23 NOTE — Progress Notes (Signed)
Patient ID: Vincent Brown, male   DOB: [birth year], 59 y.o.   MRN: [id number]    [facility]  Date:  07/23/2020  Patient ID:  Vincent, Brown 1960-11-21, MRN [id number] PCP:  Dorothey Baseman, MD  Cardiologist:  DBensimhon (CHF clinic)    Subjective: Vincent Brown is a 59 y.o. 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  Had a NSTEMI in January 2012 after presenting with bad cough. Had cath at [facility]. LAD 100% LCX 70% RCA p70%, d100%. EF 25%. Cardiac MRI: EF 15% anterior wall and inferior wall infarct with significant viability.  Unable to tolerate lisinopril or losartan due to dizziness. Spironolactone stopped due to hyperkalemia. Unable to tolerate Jardiance so stopped. Refuses ICD.   In 2017 had RLE chronic wound and MRI was suggestive of osteomyelitis. Vascular was consulted and felt AKA was the best option but the patient was not willing to have this at this time. Plastic surgery was consulted and performed debridement/wound vac. Healed completely.   Echo 6/20 EF 20-25% mild RV dysfunction Personally reviewed  Today he returns for HF follow up. He only agrees to limited visits. Retired from [employer] in July 2020. Working on his farm some but limited by SOB. Previously added Jardiance.  Says it didn't work out. Says sugars went up and PCP restarted glyburide. 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 Gets SOB when doing anything strenuous. Minimal edema. No orthopnea or PND. Not using lasix recently. Weight down 20 pounds in last 2 years. Had bloodwork with Dr. Laverda Page in 8/21 and says everything was ok. Following with ophtho for hemorrhagic retinopathy. Off ASA  ECHO 12/2011: EF 25-30% ECHO 03/14/13 EF 25% ECHO 12/15: EF 25%,  diffuse hypokinesis, normal RV size, mildly decreased RV systolic function, PA systolic pressure 40 mmHg    Past Medical History:  Diagnosis Date  . Anemia    a. 10/2015: symptomatic; requiring transfusion, EGD with antral gastritis. Anemia panel more suggestive of AOCD.  Marland Kitchen Bacteroides fragilis 11/19/2015  . Chronic osteomyelitis of right tibia (HCC) 11/19/2015  . Chronic systolic CHF (congestive heart failure) (HCC)    a. unable to tolerate lisinopril/ARB due to dizziness.   . CKD (chronic kidney disease), stage II   . Coronary artery disease 2012   a. s/p CABG - LIMA - LAD, SVG-OM2, SVG-PDA (11/2010). b. Abnl nuc 09/2015 - R/LHC 10/2015  R/LHC showed patent LIMA-LAD, and SVG-OM2, but occluded SVG-PDA; right heart pressures slightly elevated, otherwise no significant findings.  . Diabetes mellitus 2003  . Essential hypertension   . Gastritis   . Group B streptococcal infection 11/19/2015  . Headache   . Hyperlipidemia   . Ischemic cardiomyopathy   . Myocardial infarction (HCC)   . Proliferative retinopathy 2015   treated with injection of Avastin.   . Pseudomonas aeruginosa infection 11/19/2015  . Serratia infection 11/19/2015  . Shingles 2003  . Shortness of breath dyspnea   . Venous insufficiency    a. severe venous insufficiency with previous R leg ulcers requiring vein stripping.    Past Surgical History:  Procedure Laterality Date  . APPENDECTOMY  1971  . APPLICATION OF A-CELL OF EXTREMITY Right 10/24/2015   Procedure: APPLICATION OF A-CELL AND PLACEMENT OF WOUND VAC;  Surgeon: Alan Ripper  S Dillingham, DO;  Location: [facility];  Service: Plastics;  Laterality: Right;  . APPLICATION OF A-CELL OF EXTREMITY Right 01/29/2016   Procedure: APPLICATION OF A-CELL OF EXTREMITY;  Surgeon: Alena Bills Dillingham, DO;  Location: [facility];  Service: Plastics;  Laterality: Right;  . APPLICATION OF A-CELL OF EXTREMITY Right 04/07/2016   Procedure: APPLICATION OF A-CELL OF EXTREMITY AND VAC DRESSING CHANGE;   Surgeon: Alena Bills Dillingham, DO;  Location: [facility];  Service: Plastics;  Laterality: Right;  . APPLICATION OF WOUND VAC Right 01/29/2016   Procedure: APPLICATION OF WOUND VAC;  Surgeon: Alena Bills Dillingham, DO;  Location: [facility];  Service: Plastics;  Laterality: Right;  . CARDIAC CATHETERIZATION N/A 10/22/2015   Procedure: Right/Left Heart Cath and Coronary/Graft Angiography;  Surgeon: Dolores Patty, MD;  Location: [facility] INVASIVE CV LAB;  Service: Cardiovascular;  Laterality: N/A;  . COLONOSCOPY  10/2012   Dr Silver Huguenin in [place]. mall sigmoid tics, small internal hemorrhoids.  otherwise normal screening study.  no polyps, telangectasia...  . CORONARY ARTERY BYPASS GRAFT  11/05/2010  . ESOPHAGOGASTRODUODENOSCOPY (EGD) WITH PROPOFOL N/A 10/21/2015   Procedure: ESOPHAGOGASTRODUODENOSCOPY (EGD) WITH PROPOFOL;  Surgeon: Napoleon Form, MD;  [facility];  Service: Endoscopy;  Laterality: N/A;  . FRACTURE SURGERY      wrist  . I & D EXTREMITY Right 10/24/2015   Procedure: IRRIGATION AND DEBRIDEMENT EXTREMITY;  Surgeon: Alena Bills Dillingham, DO;  [facility]: [facility];  Service: Plastics;  Laterality: Right;  . I & D EXTREMITY Right 01/29/2016   Procedure: RIGHT LEG WOUND IRRIGATION AND DEBRIDEMENT ;  Surgeon: Alena Bills Dillingham, DO;  [facility]: [facility];  Service: Plastics;  Laterality: Right;  . REFRACTIVE SURGERY  2009, 2015  . SKIN SPLIT GRAFT Right 04/07/2016   Procedure: SKIN GRAFT SPLIT THICKNESS TO RIGHT LOWER LEG;  Surgeon: Alena Bills Dillingham, DO;  [facility]: [facility];  Service: Plastics;  Laterality: Right;  Marland Kitchen VARICOSE VEIN SURGERY  1995   right lower extremity    Current Outpatient Medications  Medication Sig Dispense Refill  . Acetaminophen 500 MG coapsule Take 500 mg by mouth every 4 (four) hours as needed for pain. Reported on 11/19/2015    . ALPRAZolam (XANAX) 0.25 MG tablet Take 1 tablet (0.25 mg total) by mouth 2 (two) times daily as needed for anxiety or sleep. 20 tablet 0  .  ascorbic acid (VITAMIN C) 1000 MG tablet Take 1,000 mg by mouth 2 (two) times daily.    Marland Kitchen aspirin EC 81 MG tablet Take 1 tablet (81 mg total) by mouth daily. 90 tablet 3  . atorvastatin (LIPITOR) 20 MG tablet Take 20 mg by mouth daily.    . cyclobenzaprine (FLEXERIL) 10 MG tablet Take 10 mg by mouth at bedtime as needed for muscle spasms.     . digoxin (LANOXIN) 0.125 MG tablet Take by mouth.    . furosemide (LASIX) 80 MG tablet take 1 tablet by mouth once daily (Patient taking differently: Take 80 mg by mouth as needed. ) 30 tablet 3  . glimepiride (AMARYL) 4 MG tablet Take by mouth.    . metoprolol succinate (TOPROL-XL) 50 MG 24 hr tablet take 1 1/2 tablet by mouth once daily 45 tablet 6   No current facility-administered medications for this encounter.    Allergies:   Patient has no known allergies.   Social History:  The patient  reports that he has never smoked. He has never used smokeless tobacco. He reports that he does  not drink alcohol and does not use drugs.   Family History:  The patient's family history includes Hypertension in his father; Other in his father; Parkinson's disease in his father.  ROS:  Please see the history of present illness.    All other systems are reviewed and otherwise negative.   Wt Readings from Last 3 Encounters:  07/24/20 66.2 kg (146 lb)  10/22/19 64.1 kg (141 lb 6.4 oz)  09/06/18 74.6 kg (164 lb 6.4 oz)   Vitals:   07/24/20 1115  BP: 118/72  Pulse: 72  SpO2: 95%  Weight: 66.2 kg (146 lb)  Height: 5\' 10"  (1.778 m)     PHYSICAL EXAM:  General:  Weak appearing. Thin. No resp difficulty HEENT: normal Neck: supple. no JVD. Carotids 2+ bilat; no bruits. No lymphadenopathy or thryomegaly appreciated. Cor: PMI nondisplaced. Regular rate & rhythm. No rubs, gallops or murmurs. Lungs: clear Abdomen: soft, nontender, nondistended. No hepatosplenomegaly. No bruits or masses. Good bowel sounds. Extremities: no cyanosis, clubbing, rash, edema RLE  with healed wound Neuro: alert & orientedx3, cranial nerves grossly intact. moves all 4 extremities w/o difficulty. Affect pleasant    Recent Labs: 10/22/2019: ALT 17; B Natriuretic Peptide 291.3; BUN 25; Creatinine, Ser 1.00; Hemoglobin 11.2; Platelets 168; Potassium 4.6; Sodium 139  10/22/2019: Cholesterol 115; HDL 36; LDL Cholesterol 71; Total CHOL/HDL Ratio 3.2; Triglycerides 42; VLDL 8   CrCl cannot be calculated (Patient's most recent lab result is older than the maximum 21 days allowed.).   Wt Readings from Last 3 Encounters:  10/22/19 64.1 kg (141 lb 6.4 oz)  09/06/18 74.6 kg (164 lb 6.4 oz)  04/07/16 67.1 kg (148 lb)     Other studies reviewed: Additional studies/records reviewed today include: summarized above  ASSESSMENT AND PLAN:  1. Chronic systolic CHF/ICM  -iCM EF 15-20% (echo 12/16). - Echo 6/20 EF 20-25% mild RV HK - Stable NYHA II-III - Continue toprol 75 daily, digoxin 0.125  - Intolerant to losartan and lisinopril due to hypotension.  - Intolerant spironolactone due to hyperkalemia  - We started Jardiance previously but he said sugar went up so PCP changed back. I tried to explain that CV benefit indepenedent of glycemic control but he refused. Unwilling to reconsider.  - Has refuses ICD again - I remain concerned about him but he refuses more aggressive management  - Labs today - Repeat echo at next visit 2. CAD s/p CABG as above - Cath 12/16 - Severe 3v CAD as above with patent LIMA to LAD and patent SVG to OM - SVG to RPDA is occluded but there are good L->R and R->R collaterals - No s/s ischemia - Continue statin. Off ASA due to retinopathy. Restart as able 3. DMII - followed by PCP. Refuses SGLT2i as above - HgBa1c 6.6  1/17 MD 10:33 PM

## 2020-07-24 ENCOUNTER — Other Ambulatory Visit (HOSPITAL_COMMUNITY): Payer: Self-pay | Admitting: Internal Medicine

## 2020-07-24 ENCOUNTER — Other Ambulatory Visit: Payer: Self-pay

## 2020-07-24 ENCOUNTER — Encounter (HOSPITAL_COMMUNITY): Payer: Self-pay | Admitting: Internal Medicine

## 2020-07-24 ENCOUNTER — Ambulatory Visit (HOSPITAL_COMMUNITY)
Admission: RE | Admit: 2020-07-24 | Discharge: 2020-07-24 | Disposition: A | Discharge status: Home / Self Care | Payer: 59 | Source: Ambulatory Visit | Attending: Internal Medicine | Admitting: Internal Medicine

## 2020-07-24 VITALS — BP 118/72 | HR 72 | Ht 70.0 in | Wt 146.0 lb

## 2020-07-24 DIAGNOSIS — I255 Ischemic cardiomyopathy: Secondary | ICD-10-CM | POA: Insufficient documentation

## 2020-07-24 DIAGNOSIS — Z7982 Long term (current) use of aspirin: Secondary | ICD-10-CM | POA: Diagnosis not present

## 2020-07-24 DIAGNOSIS — E1122 Type 2 diabetes mellitus with diabetic chronic kidney disease: Secondary | ICD-10-CM | POA: Insufficient documentation

## 2020-07-24 DIAGNOSIS — I5022 Chronic systolic (congestive) heart failure: Secondary | ICD-10-CM | POA: Insufficient documentation

## 2020-07-24 DIAGNOSIS — Z951 Presence of aortocoronary bypass graft: Secondary | ICD-10-CM | POA: Insufficient documentation

## 2020-07-24 DIAGNOSIS — I2510 Atherosclerotic heart disease of native coronary artery without angina pectoris: Secondary | ICD-10-CM | POA: Diagnosis not present

## 2020-07-24 DIAGNOSIS — I252 Old myocardial infarction: Secondary | ICD-10-CM | POA: Diagnosis not present

## 2020-07-24 DIAGNOSIS — I130 Hypertensive heart and chronic kidney disease with heart failure and stage 1 through stage 4 chronic kidney disease, or unspecified chronic kidney disease: Secondary | ICD-10-CM | POA: Diagnosis not present

## 2020-07-24 DIAGNOSIS — E785 Hyperlipidemia, unspecified: Secondary | ICD-10-CM | POA: Diagnosis not present

## 2020-07-24 DIAGNOSIS — N182 Chronic kidney disease, stage 2 (mild): Secondary | ICD-10-CM | POA: Diagnosis not present

## 2020-07-24 DIAGNOSIS — I872 Venous insufficiency (chronic) (peripheral): Secondary | ICD-10-CM | POA: Insufficient documentation

## 2020-07-24 DIAGNOSIS — Z79899 Other long term (current) drug therapy: Secondary | ICD-10-CM | POA: Insufficient documentation

## 2020-07-24 DIAGNOSIS — Z7984 Long term (current) use of oral hypoglycemic drugs: Secondary | ICD-10-CM | POA: Diagnosis not present

## 2020-07-24 DIAGNOSIS — Z8249 Family history of ischemic heart disease and other diseases of the circulatory system: Secondary | ICD-10-CM | POA: Diagnosis not present

## 2020-07-24 DIAGNOSIS — E118 Type 2 diabetes mellitus with unspecified complications: Secondary | ICD-10-CM | POA: Diagnosis not present

## 2020-07-24 MED ORDER — DIGOXIN 125 MCG PO TABS: 0.1250 mg | ORAL_TABLET | Freq: Every day | ORAL | 11 refills | Status: DC

## 2020-07-24 NOTE — Patient Instructions (Signed)
Please call our office in February to schedule your follow up appointment and echocardiogram  If you have any questions or concerns before your next appointment please send [place] a message through [place] or call our office at [phone].    TO LEAVE A MESSAGE FOR THE NURSE SELECT OPTION 2, PLEASE LEAVE A MESSAGE INCLUDING: . YOUR NAME . DATE OF BIRTH . CALL BACK NUMBER . REASON FOR CALL**this is important as we prioritize the call backs  YOU WILL RECEIVE A CALL BACK THE SAME DAY AS LONG AS YOU CALL BEFORE 4:00 PM  At the [facility], you and your health needs are our priority. As part of our continuing mission to provide you with exceptional heart care, we have created designated Provider Care Teams. These Care Teams include your primary Cardiologist (physician) and Advanced Practice Providers (APPs- Physician Assistants and Nurse Practitioners) who all work together to provide you with the care you need, when you need it.   You may see any of the following providers on your designated Care Team at your next follow up: Marland Kitchen Dr Arvilla Meres . Dr Marca Ancona . Tonye Becket, NP . Robbie Lis, PA . Karle Plumber, PharmD   Please be sure to bring in all your medications bottles to every appointment.

## 2020-08-04 DIAGNOSIS — H4311 Vitreous hemorrhage, right eye: Secondary | ICD-10-CM | POA: Diagnosis not present

## 2020-08-11 ENCOUNTER — Other Ambulatory Visit: Payer: Self-pay | Admitting: Family Medicine

## 2020-09-09 DIAGNOSIS — H4311 Vitreous hemorrhage, right eye: Secondary | ICD-10-CM | POA: Diagnosis not present

## 2020-10-03 ENCOUNTER — Other Ambulatory Visit: Payer: Self-pay | Admitting: Family Medicine

## 2020-10-14 ENCOUNTER — Encounter (HOSPITAL_COMMUNITY): Payer: Self-pay | Admitting: *Deleted

## 2020-10-14 NOTE — Progress Notes (Signed)
Received signed ROI from [employer] disability office requesting records from 05/30/18 to present, records faxed to them at [phone]

## 2020-11-04 DIAGNOSIS — H4311 Vitreous hemorrhage, right eye: Secondary | ICD-10-CM | POA: Diagnosis not present

## 2020-12-23 DIAGNOSIS — E119 Type 2 diabetes mellitus without complications: Secondary | ICD-10-CM | POA: Diagnosis not present

## 2020-12-23 DIAGNOSIS — I10 Essential (primary) hypertension: Secondary | ICD-10-CM | POA: Diagnosis not present

## 2020-12-23 DIAGNOSIS — E785 Hyperlipidemia, unspecified: Secondary | ICD-10-CM | POA: Diagnosis not present

## 2020-12-23 DIAGNOSIS — R1031 Right lower quadrant pain: Secondary | ICD-10-CM | POA: Diagnosis not present

## 2020-12-23 DIAGNOSIS — R972 Elevated prostate specific antigen [PSA]: Secondary | ICD-10-CM | POA: Diagnosis not present

## 2020-12-23 DIAGNOSIS — R109 Unspecified abdominal pain: Secondary | ICD-10-CM | POA: Diagnosis not present

## 2020-12-26 ENCOUNTER — Other Ambulatory Visit: Payer: Self-pay | Admitting: Family Medicine

## 2021-01-09 ENCOUNTER — Other Ambulatory Visit: Payer: Self-pay | Admitting: Family Medicine

## 2021-01-09 DIAGNOSIS — Z0000 Encounter for general adult medical examination without abnormal findings: Secondary | ICD-10-CM | POA: Diagnosis not present

## 2021-02-04 DIAGNOSIS — Z736 Limitation of activities due to disability: Secondary | ICD-10-CM

## 2021-02-09 MED FILL — Digoxin Tab 125 MCG (0.125 MG): ORAL | 30 days supply | Qty: 30 | Fill #0 | Status: AC

## 2021-02-10 ENCOUNTER — Other Ambulatory Visit: Payer: Self-pay

## 2021-02-11 ENCOUNTER — Other Ambulatory Visit: Payer: Self-pay

## 2021-02-15 NOTE — H&P (View-Only) (Signed)
Patient ID: Vincent Brown, male   DOB: 1961/04/02, 60 y.o.   MRN: [id number]    [facility]  Date:  02/15/2021  Patient ID:  Vincent Brown, Vincent Brown 05/25/1961, MRN [id number] PCP:  Dorothey Baseman, MD  Cardiologist:  DBensimhon (CHF clinic)    Subjective: Vincent Brown is a 60 y.o. 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  Had a NSTEMI in 1/12 after presenting with bad cough. Had cath at [facility]. LAD 100% LCX 70% RCA p70%, d100%. EF 25%. Cardiac MRI: EF 15% anterior wall and inferior wall infarct with significant viability -> CABG  Unable to tolerate lisinopril or losartan due to dizziness. Spironolactone stopped due to hyperkalemia. Unable to tolerate Jardiance so stopped. Refuses ICD.   In 2017 had RLE chronic wound and MRI was suggestive of osteomyelitis. Vascular was consulted and felt AKA was the best option but the patient was not willing to have this at this time. Plastic surgery was consulted and performed debridement/wound vac. Healed completely.   Echo 6/20 EF 20-25% mild RV dysfunction Personally reviewed  Today he returns for HF follow up. He only agrees to limited visits. 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 Main complaint is pain in his right lower ribs. Says it happens about every day. Worse when he exerts himself or bends over. Progressively getting worse. Had CXR and it was ok. Has been losing weight. Down almost 15 pounds. Mild DOE. Says last time he had MI he got severe diaphoresis and he hasn't had that. No edema, orthopnea or PND.   Echo today 02/16/21: EF 15% RV mild to moderately down .Personally reviewed   ECHO 12/2011: EF 25-30% ECHO 03/14/13 EF 25% ECHO 12/15: EF 25%, diffuse hypokinesis, normal RV size, mildly decreased RV systolic  function, PA systolic pressure 40 mmHg    Past Medical History:  Diagnosis Date  . Anemia    a. 10/2015: symptomatic; requiring transfusion, EGD with antral gastritis. Anemia panel more suggestive of AOCD.  Marland Kitchen Bacteroides fragilis 11/19/2015  . Chronic osteomyelitis of right tibia (HCC) 11/19/2015  . Chronic systolic CHF (congestive heart failure) (HCC)    a. unable to tolerate lisinopril/ARB due to dizziness.   . CKD (chronic kidney disease), stage II   . Coronary artery disease 2012   a. s/p CABG - LIMA - LAD, SVG-OM2, SVG-PDA (11/2010). b. Abnl nuc 09/2015 - R/LHC 10/2015  R/LHC showed patent LIMA-LAD, and SVG-OM2, but occluded SVG-PDA; right heart pressures slightly elevated, otherwise no significant findings.  . Diabetes mellitus 2003  . Essential hypertension   . Gastritis   . Group B streptococcal infection 11/19/2015  . Headache   . Hyperlipidemia   . Ischemic cardiomyopathy   . Myocardial infarction (HCC)   . Proliferative retinopathy 2015   treated with injection of Avastin.   . Pseudomonas aeruginosa infection 11/19/2015  . Serratia infection 11/19/2015  . Shingles 2003  . Shortness of breath dyspnea   . Venous insufficiency    a. severe venous insufficiency with previous R leg ulcers requiring vein stripping.    Past Surgical History:  Procedure Laterality Date  . APPENDECTOMY  1971  . APPLICATION OF A-CELL OF EXTREMITY Right 10/24/2015   Procedure: APPLICATION OF A-CELL AND PLACEMENT OF WOUND VAC;  Surgeon:  Alena Bills Dillingham, DO;  [facility]: [facility];  Service: Plastics;  Laterality: Right;  . APPLICATION OF A-CELL OF EXTREMITY Right 01/29/2016   Procedure: APPLICATION OF A-CELL OF EXTREMITY;  Surgeon: Alena Bills Dillingham, DO;  Location: [facility];  Service: Plastics;  Laterality: Right;  . APPLICATION OF A-CELL OF EXTREMITY Right 04/07/2016   Procedure: APPLICATION OF A-CELL OF EXTREMITY AND VAC DRESSING CHANGE;  Surgeon: Alena Bills Dillingham, DO;  Location: [facility];  Service:  Plastics;  Laterality: Right;  . APPLICATION OF WOUND VAC Right 01/29/2016   Procedure: APPLICATION OF WOUND VAC;  Surgeon: Alena Bills Dillingham, DO;  Location: [facility];  Service: Plastics;  Laterality: Right;  . CARDIAC CATHETERIZATION N/A 10/22/2015   Procedure: Right/Left Heart Cath and Coronary/Graft Angiography;  Surgeon: Dolores Patty, MD;  Location: [facility] INVASIVE CV LAB;  Service: Cardiovascular;  Laterality: N/A;  . COLONOSCOPY  10/2012   Dr Silver Huguenin in [place]. mall sigmoid tics, small internal hemorrhoids.  otherwise normal screening study.  no polyps, telangectasia...  . CORONARY ARTERY BYPASS GRAFT  11/05/2010  . ESOPHAGOGASTRODUODENOSCOPY (EGD) WITH PROPOFOL N/A 10/21/2015   Procedure: ESOPHAGOGASTRODUODENOSCOPY (EGD) WITH PROPOFOL;  Surgeon: Napoleon Form, MD;  [facility];  Service: Endoscopy;  Laterality: N/A;  . FRACTURE SURGERY      wrist  . I & D EXTREMITY Right 10/24/2015   Procedure: IRRIGATION AND DEBRIDEMENT EXTREMITY;  Surgeon: Alena Bills Dillingham, DO;  [facility]: [facility];  Service: Plastics;  Laterality: Right;  . I & D EXTREMITY Right 01/29/2016   Procedure: RIGHT LEG WOUND IRRIGATION AND DEBRIDEMENT ;  Surgeon: Alena Bills Dillingham, DO;  [facility]: [facility];  Service: Plastics;  Laterality: Right;  . REFRACTIVE SURGERY  2009, 2015  . SKIN SPLIT GRAFT Right 04/07/2016   Procedure: SKIN GRAFT SPLIT THICKNESS TO RIGHT LOWER LEG;  Surgeon: Alena Bills Dillingham, DO;  [facility]: [facility];  Service: Plastics;  Laterality: Right;  Marland Kitchen VARICOSE VEIN SURGERY  1995   right lower extremity    Current Outpatient Medications  Medication Sig Dispense Refill  . Acetaminophen 500 MG coapsule Take 500 mg by mouth every 4 (four) hours as needed for pain. Reported on 11/19/2015 (Patient not taking: Reported on 07/24/2020)    . ALPRAZolam (XANAX) 0.25 MG tablet Take 1 tablet (0.25 mg total) by mouth 2 (two) times daily as needed for anxiety or sleep. (Patient not taking: Reported on  07/24/2020) 20 tablet 0  . amoxicillin (AMOXIL) 500 MG capsule TAKE 2 CAPSULES BY MOUTH 2 TIMES DAILY 40 capsule 0  . ascorbic acid (VITAMIN C) 1000 MG tablet Take 1,000 mg by mouth 2 (two) times daily. (Patient not taking: Reported on 07/24/2020)    . aspirin EC 81 MG tablet Take 1 tablet (81 mg total) by mouth daily. 90 tablet 3  . atorvastatin (LIPITOR) 20 MG tablet Take 20 mg by mouth daily.    Marland Kitchen atorvastatin (LIPITOR) 20 MG tablet TAKE 1 TABLET BY MOUTH ONCE DAILY 90 tablet 1  . clarithromycin (BIAXIN) 500 MG tablet TAKE 1 TABLET BY MOUTH 2 (TWO) TIMES DAILY FOR 10 DAYS 20 tablet 0  . cyclobenzaprine (FLEXERIL) 10 MG tablet Take 10 mg by mouth at bedtime as needed for muscle spasms.     . digoxin (LANOXIN) 0.125 MG tablet TAKE 1 TABLET (0.125 MG TOTAL) BY MOUTH DAILY. 30 tablet 11  . furosemide (LASIX) 80 MG tablet take 1 tablet by mouth once daily (Patient taking differently: Take 80 mg by mouth as  needed. ) 30 tablet 3  . glimepiride (AMARYL) 4 MG tablet Take by mouth.    Marland Kitchen glimepiride (AMARYL) 4 MG tablet TAKE 1 TABLET BY MOUTH 2 TIMES DAILY 180 tablet 1  . metoprolol succinate (TOPROL-XL) 50 MG 24 hr tablet take 1 1/2 tablet by mouth once daily 45 tablet 6  . metoprolol succinate (TOPROL-XL) 50 MG 24 hr tablet TAKE 1 AND 1/2 TABLETS BY MOUTH ONCE DAILY. 135 tablet 1  . pantoprazole (PROTONIX) 20 MG tablet TAKE 1 TABLET BY MOUTH 1 TO 2 TIMES A DAY 60 tablet 1  . pantoprazole (PROTONIX) 20 MG tablet TAKE 1 TABLET BY MOUTH TWO TIMES DAILY BEFORE MEALS 60 tablet 0   No current facility-administered medications for this encounter.    Allergies:   Patient has no known allergies.   Social History:  The patient  reports that he has never smoked. He has never used smokeless tobacco. He reports that he does not drink alcohol and does not use drugs.   Family History:  The patient's family history includes Hypertension in his father; Other in his father; Parkinson's disease in his father.  ROS:   Please see the history of present illness.    All other systems are reviewed and otherwise negative.   Wt Readings from Last 3 Encounters:  02/16/21 60.1 kg (132 lb 6.4 oz)  07/24/20 66.2 kg (146 lb)  10/22/19 64.1 kg (141 lb 6.4 oz)   Vitals:   02/16/21 1403  BP: 110/60  Pulse: 64  SpO2: 97%  Weight: 60.1 kg (132 lb 6.4 oz)     PHYSICAL EXAM:  General:  Thin male. No resp difficulty HEENT: normal Neck: supple. no JVD. Carotids 2+ bilat; no bruits. No lymphadenopathy or thryomegaly appreciated. Cor: PMI nondisplaced. Regular rate & rhythm. No rubs, gallops or murmurs. Lungs: clear Abdomen: soft, nontender, nondistended. No hepatosplenomegaly. No bruits or masses. Good bowel sounds. Extremities: no cyanosis, clubbing, rash, edema Neuro: alert & orientedx3, cranial nerves grossly intact. moves all 4 extremities w/o difficulty. Affect pleasant  NSR 85 atypical LBBB Personally reviewed   Recent Labs: No results found for requested labs within last 8760 hours.  No results found for requested labs within last 8760 hours.   CrCl cannot be calculated (Patient's most recent lab result is older than the maximum 21 days allowed.).   Wt Readings from Last 3 Encounters:  07/24/20 66.2 kg (146 lb)  10/22/19 64.1 kg (141 lb 6.4 oz)  09/06/18 74.6 kg (164 lb 6.4 oz)     Other studies reviewed: Additional studies/records reviewed today include: summarized above  ASSESSMENT AND PLAN:  1. Chronic systolic CHF/ICM  -iCM EF 15-20% (echo 12/16). - Echo 6/20 EF 20-25% mild RV HK - Echo today LVEF 15% RV mild to moderately down Personally reviewed - Progressively worse NYHA III-IIIB - Continue toprol 75 daily, digoxin 0.125  - Intolerant to losartan and lisinopril due to hypotension.  - Intolerant spironolactone due to hyperkalemia  - We started Jardiance previously but he said sugar went up so PCP changed back. I tried to explain that CV benefit indepenedent of glycemic control but he  refused. Unwilling to reconsider.  - Continues to refuse ICD - I am growing increasingly concerned that he is nearing end-stage. I am also worried that his rib/chest pain is an anginal equivalent. I discussed this with him and his wife. Will plan R/L cath in next 1-2 weeks - We discussed possible need for VAD. He said he would be  willing to consider as a "last resort" but I am not sure he will qualify - Check labs today  2. CAD s/p CABG as above - Cath 12/16 - Severe 3v CAD as above with patent LIMA to LAD and patent SVG to OM - SVG to RPDA is occluded but there are good L->R and R->R collaterals - I am worried that his rib/chest pain is an anginal equivalent. I discussed this with him and his wife. Will plan R/L cath in next 1-2 weeks. Advised to come to ER or call 911 if progressing or has severe episode - Continue ASA/statin  3. DMII - followed by PCP. Refuses SGLT2i as above - HgBa1c well controlled per his report  Total time spent 35 minutes. Over half that time spent discussing above.    Arvilla Meres MD 7:43 PM

## 2021-02-15 NOTE — Progress Notes (Signed)
Patient ID: Vincent Brown, male   DOB: 02/13/1961, 60 y.o.   MRN: [id number]    [facility]  Date:  02/15/2021  Patient ID:  Vincent Brown, DOB 07/31/1961, MRN [id number] PCP:  Bronstein, David, MD  Cardiologist:  DBensimhon (CHF clinic)    Subjective: Vincent Brown is a 60 y.o. 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  Had a NSTEMI in 1/12 after presenting with bad cough. Had cath at [facility]. LAD 100% LCX 70% RCA p70%, d100%. EF 25%. Cardiac MRI: EF 15% anterior wall and inferior wall infarct with significant viability -> CABG  Unable to tolerate lisinopril or losartan due to dizziness. Spironolactone stopped due to hyperkalemia. Unable to tolerate Jardiance so stopped. Refuses ICD.   In 2017 had RLE chronic wound and MRI was suggestive of osteomyelitis. Vascular was consulted and felt AKA was the best option but the patient was not willing to have this at this time. Plastic surgery was consulted and performed debridement/wound vac. Healed completely.   Echo 6/20 EF 20-25% mild RV dysfunction Personally reviewed  Today he returns for HF follow up. He only agrees to limited visits. Retired from [employer] in July 2020. 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 Main complaint is pain in his right lower ribs. Says it happens about every day. Worse when he exerts himself or bends over. Progressively getting worse. Had CXR and it was ok. Has been losing weight. Down almost 15 pounds. Mild DOE. Says last time he had MI he got severe diaphoresis and he hasn't had that. No edema, orthopnea or PND.   Echo today 02/16/21: EF 15% RV mild to moderately down .Personally reviewed   ECHO 12/2011: EF 25-30% ECHO 03/14/13 EF 25% ECHO 12/15: EF 25%, diffuse hypokinesis, normal RV size, mildly decreased RV systolic  function, PA systolic pressure 40 mmHg    Past Medical History:  Diagnosis Date  . Anemia    a. 10/2015: symptomatic; requiring transfusion, EGD with antral gastritis. Anemia panel more suggestive of AOCD.  . Bacteroides fragilis 11/19/2015  . Chronic osteomyelitis of right tibia (HCC) 11/19/2015  . Chronic systolic CHF (congestive heart failure) (HCC)    a. unable to tolerate lisinopril/ARB due to dizziness.   . CKD (chronic kidney disease), stage II   . Coronary artery disease 2012   a. s/p CABG - LIMA - LAD, SVG-OM2, SVG-PDA (11/2010). b. Abnl nuc 09/2015 - R/LHC 10/2015  R/LHC showed patent LIMA-LAD, and SVG-OM2, but occluded SVG-PDA; right heart pressures slightly elevated, otherwise no significant findings.  . Diabetes mellitus 2003  . Essential hypertension   . Gastritis   . Group B streptococcal infection 11/19/2015  . Headache   . Hyperlipidemia   . Ischemic cardiomyopathy   . Myocardial infarction (HCC)   . Proliferative retinopathy 2015   treated with injection of Avastin.   . Pseudomonas aeruginosa infection 11/19/2015  . Serratia infection 11/19/2015  . Shingles 2003  . Shortness of breath dyspnea   . Venous insufficiency    a. severe venous insufficiency with previous R leg ulcers requiring vein stripping.    Past Surgical History:  Procedure Laterality Date  . APPENDECTOMY  1971  . APPLICATION OF A-CELL OF EXTREMITY Right 10/24/2015   Procedure: APPLICATION OF A-CELL AND PLACEMENT OF WOUND VAC;  Surgeon:   Alena Bills Dillingham, DO;  Location: [facility] OR;  Service: Plastics;  Laterality: Right;  . APPLICATION OF A-CELL OF EXTREMITY Right 01/29/2016   Procedure: APPLICATION OF A-CELL OF EXTREMITY;  Surgeon: Alena Bills Dillingham, DO;  Location: [facility] OR;  Service: Plastics;  Laterality: Right;  . APPLICATION OF A-CELL OF EXTREMITY Right 04/07/2016   Procedure: APPLICATION OF A-CELL OF EXTREMITY AND VAC DRESSING CHANGE;  Surgeon: Alena Bills Dillingham, DO;  Location: [facility];  Service:  Plastics;  Laterality: Right;  . APPLICATION OF WOUND VAC Right 01/29/2016   Procedure: APPLICATION OF WOUND VAC;  Surgeon: Alena Bills Dillingham, DO;  Location: [facility];  Service: Plastics;  Laterality: Right;  . CARDIAC CATHETERIZATION N/A 10/22/2015   Procedure: Right/Left Heart Cath and Coronary/Graft Angiography;  Surgeon: Dolores Patty, MD;  Location: [facility] INVASIVE CV LAB;  Service: Cardiovascular;  Laterality: N/A;  . COLONOSCOPY  10/2012   Dr Silver Huguenin in [place]. mall sigmoid tics, small internal hemorrhoids.  otherwise normal screening study.  no polyps, telangectasia...  . CORONARY ARTERY BYPASS GRAFT  11/05/2010  . ESOPHAGOGASTRODUODENOSCOPY (EGD) WITH PROPOFOL N/A 10/21/2015   Procedure: ESOPHAGOGASTRODUODENOSCOPY (EGD) WITH PROPOFOL;  Surgeon: Napoleon Form, MD;  Location: [facility];  Service: Endoscopy;  Laterality: N/A;  . FRACTURE SURGERY      wrist  . I & D EXTREMITY Right 10/24/2015   Procedure: IRRIGATION AND DEBRIDEMENT EXTREMITY;  Surgeon: Alena Bills Dillingham, DO;  Location: [facility];  Service: Plastics;  Laterality: Right;  . I & D EXTREMITY Right 01/29/2016   Procedure: RIGHT LEG WOUND IRRIGATION AND DEBRIDEMENT ;  Surgeon: Alena Bills Dillingham, DO;  Location: [facility];  Service: Plastics;  Laterality: Right;  . REFRACTIVE SURGERY  2009, 2015  . SKIN SPLIT GRAFT Right 04/07/2016   Procedure: SKIN GRAFT SPLIT THICKNESS TO RIGHT LOWER LEG;  Surgeon: Alena Bills Dillingham, DO;  Location: [facility];  Service: Plastics;  Laterality: Right;  Marland Kitchen VARICOSE VEIN SURGERY  1995   right lower extremity    Current Outpatient Medications  Medication Sig Dispense Refill  . Acetaminophen 500 MG coapsule Take 500 mg by mouth every 4 (four) hours as needed for pain. Reported on 11/19/2015 (Patient not taking: Reported on 07/24/2020)    . ALPRAZolam (XANAX) 0.25 MG tablet Take 1 tablet (0.25 mg total) by mouth 2 (two) times daily as needed for anxiety or sleep. (Patient not taking: Reported on  07/24/2020) 20 tablet 0  . amoxicillin (AMOXIL) 500 MG capsule TAKE 2 CAPSULES BY MOUTH 2 TIMES DAILY 40 capsule 0  . ascorbic acid (VITAMIN C) 1000 MG tablet Take 1,000 mg by mouth 2 (two) times daily. (Patient not taking: Reported on 07/24/2020)    . aspirin EC 81 MG tablet Take 1 tablet (81 mg total) by mouth daily. 90 tablet 3  . atorvastatin (LIPITOR) 20 MG tablet Take 20 mg by mouth daily.    Marland Kitchen atorvastatin (LIPITOR) 20 MG tablet TAKE 1 TABLET BY MOUTH ONCE DAILY 90 tablet 1  . clarithromycin (BIAXIN) 500 MG tablet TAKE 1 TABLET BY MOUTH 2 (TWO) TIMES DAILY FOR 10 DAYS 20 tablet 0  . cyclobenzaprine (FLEXERIL) 10 MG tablet Take 10 mg by mouth at bedtime as needed for muscle spasms.     . digoxin (LANOXIN) 0.125 MG tablet TAKE 1 TABLET (0.125 MG TOTAL) BY MOUTH DAILY. 30 tablet 11  . furosemide (LASIX) 80 MG tablet take 1 tablet by mouth once daily (Patient taking differently: Take 80 mg by mouth as  needed. ) 30 tablet 3  . glimepiride (AMARYL) 4 MG tablet Take by mouth.    Marland Kitchen glimepiride (AMARYL) 4 MG tablet TAKE 1 TABLET BY MOUTH 2 TIMES DAILY 180 tablet 1  . metoprolol succinate (TOPROL-XL) 50 MG 24 hr tablet take 1 1/2 tablet by mouth once daily 45 tablet 6  . metoprolol succinate (TOPROL-XL) 50 MG 24 hr tablet TAKE 1 AND 1/2 TABLETS BY MOUTH ONCE DAILY. 135 tablet 1  . pantoprazole (PROTONIX) 20 MG tablet TAKE 1 TABLET BY MOUTH 1 TO 2 TIMES A DAY 60 tablet 1  . pantoprazole (PROTONIX) 20 MG tablet TAKE 1 TABLET BY MOUTH TWO TIMES DAILY BEFORE MEALS 60 tablet 0   No current facility-administered medications for this encounter.    Allergies:   Patient has no known allergies.   Social History:  The patient  reports that he has never smoked. He has never used smokeless tobacco. He reports that he does not drink alcohol and does not use drugs.   Family History:  The patient's family history includes Hypertension in his father; Other in his father; Parkinson's disease in his father.  ROS:   Please see the history of present illness.    All other systems are reviewed and otherwise negative.   Wt Readings from Last 3 Encounters:  02/16/21 60.1 kg (132 lb 6.4 oz)  07/24/20 66.2 kg (146 lb)  10/22/19 64.1 kg (141 lb 6.4 oz)   Vitals:   02/16/21 1403  BP: 110/60  Pulse: 64  SpO2: 97%  Weight: 60.1 kg (132 lb 6.4 oz)     PHYSICAL EXAM:  General:  Thin male. No resp difficulty HEENT: normal Neck: supple. no JVD. Carotids 2+ bilat; no bruits. No lymphadenopathy or thryomegaly appreciated. Cor: PMI nondisplaced. Regular rate & rhythm. No rubs, gallops or murmurs. Lungs: clear Abdomen: soft, nontender, nondistended. No hepatosplenomegaly. No bruits or masses. Good bowel sounds. Extremities: no cyanosis, clubbing, rash, edema Neuro: alert & orientedx3, cranial nerves grossly intact. moves all 4 extremities w/o difficulty. Affect pleasant  NSR 85 atypical LBBB Personally reviewed   Recent Labs: No results found for requested labs within last 8760 hours.  No results found for requested labs within last 8760 hours.   CrCl cannot be calculated (Patient's most recent lab result is older than the maximum 21 days allowed.).   Wt Readings from Last 3 Encounters:  07/24/20 66.2 kg (146 lb)  10/22/19 64.1 kg (141 lb 6.4 oz)  09/06/18 74.6 kg (164 lb 6.4 oz)     Other studies reviewed: Additional studies/records reviewed today include: summarized above  ASSESSMENT AND PLAN:  1. Chronic systolic CHF/ICM  -iCM EF 15-20% (echo 12/16). - Echo 6/20 EF 20-25% mild RV HK - Echo today LVEF 15% RV mild to moderately down Personally reviewed - Progressively worse NYHA III-IIIB - Continue toprol 75 daily, digoxin 0.125  - Intolerant to losartan and lisinopril due to hypotension.  - Intolerant spironolactone due to hyperkalemia  - We started Jardiance previously but he said sugar went up so PCP changed back. I tried to explain that CV benefit indepenedent of glycemic control but he  refused. Unwilling to reconsider.  - Continues to refuse ICD - I am growing increasingly concerned that he is nearing end-stage. I am also worried that his rib/chest pain is an anginal equivalent. I discussed this with him and his wife. Will plan R/L cath in next 1-2 weeks - We discussed possible need for VAD. He said he would be  willing to consider as a "last resort" but I am not sure he will qualify - Check labs today  2. CAD s/p CABG as above - Cath 12/16 - Severe 3v CAD as above with patent LIMA to LAD and patent SVG to OM - SVG to RPDA is occluded but there are good L->R and R->R collaterals - I am worried that his rib/chest pain is an anginal equivalent. I discussed this with him and his wife. Will plan R/L cath in next 1-2 weeks. Advised to come to ER or call 911 if progressing or has severe episode - Continue ASA/statin  3. DMII - followed by PCP. Refuses SGLT2i as above - HgBa1c well controlled per his report  Total time spent 35 minutes. Over half that time spent discussing above.    Arvilla Meres MD 7:43 PM

## 2021-02-16 ENCOUNTER — Ambulatory Visit (HOSPITAL_COMMUNITY)
Admission: RE | Admit: 2021-02-16 | Discharge: 2021-02-16 | Disposition: A | Discharge status: Home / Self Care | Payer: 59 | Source: Ambulatory Visit | Attending: Family Medicine | Admitting: Family Medicine

## 2021-02-16 ENCOUNTER — Other Ambulatory Visit: Payer: Self-pay

## 2021-02-16 ENCOUNTER — Encounter (HOSPITAL_COMMUNITY): Payer: Self-pay | Admitting: Internal Medicine

## 2021-02-16 ENCOUNTER — Ambulatory Visit (HOSPITAL_BASED_OUTPATIENT_CLINIC_OR_DEPARTMENT_OTHER)
Admission: RE | Admit: 2021-02-16 | Discharge: 2021-02-16 | Disposition: A | Discharge status: Home / Self Care | Payer: 59 | Source: Ambulatory Visit | Attending: Internal Medicine | Admitting: Internal Medicine

## 2021-02-16 VITALS — BP 110/60 | HR 64 | Wt 132.4 lb

## 2021-02-16 DIAGNOSIS — Z79899 Other long term (current) drug therapy: Secondary | ICD-10-CM | POA: Diagnosis not present

## 2021-02-16 DIAGNOSIS — E1169 Type 2 diabetes mellitus with other specified complication: Secondary | ICD-10-CM | POA: Insufficient documentation

## 2021-02-16 DIAGNOSIS — I5022 Chronic systolic (congestive) heart failure: Secondary | ICD-10-CM | POA: Diagnosis not present

## 2021-02-16 DIAGNOSIS — I252 Old myocardial infarction: Secondary | ICD-10-CM | POA: Insufficient documentation

## 2021-02-16 DIAGNOSIS — R42 Dizziness and giddiness: Secondary | ICD-10-CM | POA: Insufficient documentation

## 2021-02-16 DIAGNOSIS — Z7982 Long term (current) use of aspirin: Secondary | ICD-10-CM | POA: Diagnosis not present

## 2021-02-16 DIAGNOSIS — I2510 Atherosclerotic heart disease of native coronary artery without angina pectoris: Secondary | ICD-10-CM | POA: Diagnosis not present

## 2021-02-16 DIAGNOSIS — Z951 Presence of aortocoronary bypass graft: Secondary | ICD-10-CM | POA: Insufficient documentation

## 2021-02-16 DIAGNOSIS — E118 Type 2 diabetes mellitus with unspecified complications: Secondary | ICD-10-CM | POA: Diagnosis not present

## 2021-02-16 DIAGNOSIS — I130 Hypertensive heart and chronic kidney disease with heart failure and stage 1 through stage 4 chronic kidney disease, or unspecified chronic kidney disease: Secondary | ICD-10-CM | POA: Diagnosis not present

## 2021-02-16 DIAGNOSIS — I872 Venous insufficiency (chronic) (peripheral): Secondary | ICD-10-CM | POA: Diagnosis not present

## 2021-02-16 DIAGNOSIS — D649 Anemia, unspecified: Secondary | ICD-10-CM | POA: Diagnosis not present

## 2021-02-16 DIAGNOSIS — E785 Hyperlipidemia, unspecified: Secondary | ICD-10-CM | POA: Insufficient documentation

## 2021-02-16 DIAGNOSIS — Z7984 Long term (current) use of oral hypoglycemic drugs: Secondary | ICD-10-CM | POA: Insufficient documentation

## 2021-02-16 DIAGNOSIS — R0609 Other forms of dyspnea: Secondary | ICD-10-CM | POA: Insufficient documentation

## 2021-02-16 DIAGNOSIS — I25118 Atherosclerotic heart disease of native coronary artery with other forms of angina pectoris: Secondary | ICD-10-CM | POA: Insufficient documentation

## 2021-02-16 DIAGNOSIS — E1122 Type 2 diabetes mellitus with diabetic chronic kidney disease: Secondary | ICD-10-CM | POA: Diagnosis not present

## 2021-02-16 DIAGNOSIS — Z8249 Family history of ischemic heart disease and other diseases of the circulatory system: Secondary | ICD-10-CM | POA: Diagnosis not present

## 2021-02-16 DIAGNOSIS — N182 Chronic kidney disease, stage 2 (mild): Secondary | ICD-10-CM | POA: Diagnosis not present

## 2021-02-16 LAB — BASIC METABOLIC PANEL
Anion gap: 7 (ref 5–15)
BUN: 20 mg/dL (ref 8–23)
CO2: 32 mmol/L (ref 22–32)
Calcium: 9.4 mg/dL (ref 8.9–10.3)
Chloride: 98 mmol/L (ref 98–111)
Creatinine, Ser: 0.87 mg/dL (ref 0.61–1.24)
GFR, Estimated: >60 mL/min (ref >60)
Glucose, Bld: 151 mg/dL — ABNORMAL HIGH (ref 70–99)
Potassium: 5 mmol/L (ref 3.5–5.1)
Sodium: 137 mmol/L (ref 135–145)

## 2021-02-16 LAB — CBC
HCT: 37.5 % — ABNORMAL LOW (ref 39.0–52.0)
Hemoglobin: 11.5 g/dL — ABNORMAL LOW (ref 13.0–17.0)
MCH: 27.8 pg (ref 26.0–34.0)
MCHC: 30.7 g/dL (ref 30.0–36.0)
MCV: 90.8 fL (ref 80.0–100.0)
Platelets: 162 10*3/uL (ref 150–400)
RBC: 4.13 MIL/uL — ABNORMAL LOW (ref 4.22–5.81)
RDW: 13.7 % (ref 11.5–15.5)
WBC: 6.3 10*3/uL (ref 4.0–10.5)
nRBC: 0 % (ref 0.0–0.2)

## 2021-02-16 LAB — ECHOCARDIOGRAM COMPLETE
Area-P 1/2: 6 cm2
S' Lateral: 6 cm

## 2021-02-16 NOTE — Progress Notes (Signed)
  Echocardiogram 2D Echocardiogram with 3D has been performed.  Leta Jungling M 02/16/2021, 1:27 PM

## 2021-02-16 NOTE — Patient Instructions (Signed)
EKG done today.  Labs done today. We will contact you only if your labs are abnormal.  No medication changes were made. Please continue all current medications as prescribed.  Your physician recommends that you schedule a follow-up appointment in: 4 months   If you have any questions or concerns before your next appointment please send [place] a message through [place] or call our office at [phone].    TO LEAVE A MESSAGE FOR THE NURSE SELECT OPTION 2, PLEASE LEAVE A MESSAGE INCLUDING: . YOUR NAME . DATE OF BIRTH . CALL BACK NUMBER . REASON FOR CALL**this is important as we prioritize the call backs  YOU WILL RECEIVE A CALL BACK THE SAME DAY AS LONG AS YOU CALL BEFORE 4:00 PM   Do the following things EVERYDAY: 1) Weigh yourself in the morning before breakfast. Write it down and keep it in a log. 2) Take your medicines as prescribed 3) Eat low salt foods--Limit salt (sodium) to 2000 mg per day.  4) Stay as active as you can everyday 5) Limit all fluids for the day to less than 2 liters   At the [facility], you and your health needs are our priority. As part of our continuing mission to provide you with exceptional heart care, we have created designated Provider Care Teams. These Care Teams include your primary Cardiologist (physician) and Advanced Practice Providers (APPs- Physician Assistants and Nurse Practitioners) who all work together to provide you with the care you need, when you need it.   You may see any of the following providers on your designated Care Team at your next follow up: Marland Kitchen Dr Arvilla Meres . Dr Marca Ancona . Tonye Becket, NP . Robbie Lis, PA . Karle Plumber, PharmD   Please be sure to bring in all your medications bottles to every appointment.    You are scheduled for a Cardiac Catheterization on Friday, April 29 with Dr. Arvilla Meres.  1. Please arrive at the [facility] (Main Entrance A) at [facility]: [address] [place], [place] [address] at 8:30 AM (This time is two hours before your procedure to ensure your preparation). Free valet parking service is available.   Special note: Every effort is made to have your procedure done on time. Please understand that emergencies sometimes delay scheduled procedures.  2. Diet: Do not eat solid foods after midnight.  The patient may have clear liquids until 5am upon the day of the procedure.  3. YOU MUST HAVE PRE PROCEDURE COVID TESTING DONE Wednesday April 27TH, 2022 AT [facility] ([address]  4.On the morning of your procedure,DO NOT  take your Lasix. You may take the rest of your medications with sips of water.  5. Plan for one night stay--bring personal belongings. 6. Bring a current list of your medications and current insurance cards. 7. You MUST have a responsible person to drive you home. 8. Someone MUST be with you the first 24 hours after you arrive home or your discharge will be delayed. 9. Please wear clothes that are easy to get on and off and wear slip-on shoes.  Thank you for allowing [place] to care for you!   -- [facility]

## 2021-02-25 ENCOUNTER — Other Ambulatory Visit: Payer: Self-pay

## 2021-02-25 ENCOUNTER — Other Ambulatory Visit
Admission: RE | Admit: 2021-02-25 | Discharge: 2021-02-25 | Disposition: A | Discharge status: Home / Self Care | Payer: 59 | Source: Ambulatory Visit | Attending: Internal Medicine | Admitting: Internal Medicine

## 2021-02-25 DIAGNOSIS — Z20822 Contact with and (suspected) exposure to covid-19: Secondary | ICD-10-CM | POA: Insufficient documentation

## 2021-02-25 DIAGNOSIS — Z01812 Encounter for preprocedural laboratory examination: Secondary | ICD-10-CM | POA: Diagnosis not present

## 2021-02-26 LAB — SARS CORONAVIRUS 2 (TAT 6-24 HRS): SARS Coronavirus 2: NEGATIVE

## 2021-02-27 ENCOUNTER — Ambulatory Visit (HOSPITAL_COMMUNITY)
Admission: RE | Admit: 2021-02-27 | Discharge: 2021-02-27 | Disposition: A | Discharge status: Home / Self Care | Payer: 59 | Attending: Internal Medicine | Admitting: Internal Medicine

## 2021-02-27 ENCOUNTER — Encounter (HOSPITAL_COMMUNITY)
Admission: RE | Disposition: A | Discharge status: Home / Self Care | Payer: Self-pay | Source: Home / Self Care | Attending: Internal Medicine

## 2021-02-27 ENCOUNTER — Other Ambulatory Visit: Payer: Self-pay

## 2021-02-27 DIAGNOSIS — Z7982 Long term (current) use of aspirin: Secondary | ICD-10-CM | POA: Insufficient documentation

## 2021-02-27 DIAGNOSIS — N182 Chronic kidney disease, stage 2 (mild): Secondary | ICD-10-CM | POA: Diagnosis not present

## 2021-02-27 DIAGNOSIS — I2510 Atherosclerotic heart disease of native coronary artery without angina pectoris: Secondary | ICD-10-CM | POA: Insufficient documentation

## 2021-02-27 DIAGNOSIS — I255 Ischemic cardiomyopathy: Secondary | ICD-10-CM | POA: Diagnosis not present

## 2021-02-27 DIAGNOSIS — I5022 Chronic systolic (congestive) heart failure: Secondary | ICD-10-CM | POA: Diagnosis not present

## 2021-02-27 DIAGNOSIS — Z7984 Long term (current) use of oral hypoglycemic drugs: Secondary | ICD-10-CM | POA: Diagnosis not present

## 2021-02-27 DIAGNOSIS — E1122 Type 2 diabetes mellitus with diabetic chronic kidney disease: Secondary | ICD-10-CM | POA: Diagnosis not present

## 2021-02-27 DIAGNOSIS — I130 Hypertensive heart and chronic kidney disease with heart failure and stage 1 through stage 4 chronic kidney disease, or unspecified chronic kidney disease: Secondary | ICD-10-CM | POA: Insufficient documentation

## 2021-02-27 DIAGNOSIS — Z79899 Other long term (current) drug therapy: Secondary | ICD-10-CM | POA: Diagnosis not present

## 2021-02-27 DIAGNOSIS — Z951 Presence of aortocoronary bypass graft: Secondary | ICD-10-CM | POA: Insufficient documentation

## 2021-02-27 HISTORY — PX: RIGHT/LEFT HEART CATH AND CORONARY/GRAFT ANGIOGRAPHY: CATH118267

## 2021-02-27 LAB — POCT I-STAT 7, (LYTES, BLD GAS, ICA,H+H)
Acid-Base Excess: 5 mmol/L — ABNORMAL HIGH (ref 0.0–2.0)
Bicarbonate: 30.9 mmol/L — ABNORMAL HIGH (ref 20.0–28.0)
Calcium, Ion: 1.16 mmol/L (ref 1.15–1.40)
HCT: 31 % — ABNORMAL LOW (ref 39.0–52.0)
Hemoglobin: 10.5 g/dL — ABNORMAL LOW (ref 13.0–17.0)
O2 Saturation: 100 %
Potassium: 3.7 mmol/L (ref 3.5–5.1)
Sodium: 142 mmol/L (ref 135–145)
TCO2: 33 mmol/L — ABNORMAL HIGH (ref 22–32)
pCO2 arterial: 52.6 mmHg — ABNORMAL HIGH (ref 32.0–48.0)
pH, Arterial: 7.377 (ref 7.350–7.450)
pO2, Arterial: 253 mmHg — ABNORMAL HIGH (ref 83.0–108.0)

## 2021-02-27 LAB — GLUCOSE, CAPILLARY
Glucose-Capillary: 55 mg/dL — ABNORMAL LOW (ref 70–99)
Glucose-Capillary: 118 mg/dL — ABNORMAL HIGH (ref 70–99)
Glucose-Capillary: 59 mg/dL — ABNORMAL LOW (ref 70–99)
Glucose-Capillary: 79 mg/dL (ref 70–99)

## 2021-02-27 LAB — POCT I-STAT EG7
Acid-Base Excess: 6 mmol/L — ABNORMAL HIGH (ref 0.0–2.0)
Acid-Base Excess: 6 mmol/L — ABNORMAL HIGH (ref 0.0–2.0)
Acid-Base Excess: 7 mmol/L — ABNORMAL HIGH (ref 0.0–2.0)
Bicarbonate: 32.6 mmol/L — ABNORMAL HIGH (ref 20.0–28.0)
Bicarbonate: 32.8 mmol/L — ABNORMAL HIGH (ref 20.0–28.0)
Bicarbonate: 33.9 mmol/L — ABNORMAL HIGH (ref 20.0–28.0)
Calcium, Ion: 1.23 mmol/L (ref 1.15–1.40)
Calcium, Ion: 1.27 mmol/L (ref 1.15–1.40)
Calcium, Ion: 1.29 mmol/L (ref 1.15–1.40)
HCT: 31 % — ABNORMAL LOW (ref 39.0–52.0)
HCT: 31 % — ABNORMAL LOW (ref 39.0–52.0)
HCT: 32 % — ABNORMAL LOW (ref 39.0–52.0)
Hemoglobin: 10.5 g/dL — ABNORMAL LOW (ref 13.0–17.0)
Hemoglobin: 10.5 g/dL — ABNORMAL LOW (ref 13.0–17.0)
Hemoglobin: 10.9 g/dL — ABNORMAL LOW (ref 13.0–17.0)
O2 Saturation: 73 %
O2 Saturation: 74 %
O2 Saturation: 74 %
Potassium: 4 mmol/L (ref 3.5–5.1)
Potassium: 4 mmol/L (ref 3.5–5.1)
Potassium: 4 mmol/L (ref 3.5–5.1)
Sodium: 141 mmol/L (ref 135–145)
Sodium: 141 mmol/L (ref 135–145)
Sodium: 142 mmol/L (ref 135–145)
TCO2: 34 mmol/L — ABNORMAL HIGH (ref 22–32)
TCO2: 35 mmol/L — ABNORMAL HIGH (ref 22–32)
TCO2: 36 mmol/L — ABNORMAL HIGH (ref 22–32)
pCO2, Ven: 58.2 mmHg (ref 44.0–60.0)
pCO2, Ven: 58.3 mmHg (ref 44.0–60.0)
pCO2, Ven: 59 mmHg (ref 44.0–60.0)
pH, Ven: 7.355 (ref 7.250–7.430)
pH, Ven: 7.359 (ref 7.250–7.430)
pH, Ven: 7.368 (ref 7.250–7.430)
pO2, Ven: 42 mmHg (ref 32.0–45.0)
pO2, Ven: 42 mmHg (ref 32.0–45.0)
pO2, Ven: 42 mmHg (ref 32.0–45.0)

## 2021-02-27 SURGERY — RIGHT/LEFT HEART CATH AND CORONARY/GRAFT ANGIOGRAPHY
Anesthesia: LOCAL

## 2021-02-27 MED ORDER — DEXTROSE 50 % IV SOLN
INTRAVENOUS | Status: AC
  Administered 2021-02-27: 25 mL
  Filled 2021-02-27: qty 50

## 2021-02-27 MED ORDER — SODIUM CHLORIDE 0.9 % IV SOLN: 250.0000 mL | INTRAVENOUS | Status: DC | PRN

## 2021-02-27 MED ORDER — SODIUM CHLORIDE 0.9 % IV SOLN: INTRAVENOUS | Status: DC

## 2021-02-27 MED ORDER — LIDOCAINE HCL (PF) 1 % IJ SOLN
INTRAMUSCULAR | Status: DC | PRN
  Administered 2021-02-27 (×2): 2 mL

## 2021-02-27 MED ORDER — MIDAZOLAM HCL 2 MG/2ML IJ SOLN
INTRAMUSCULAR | Status: AC
  Filled 2021-02-27: qty 2

## 2021-02-27 MED ORDER — LABETALOL HCL 5 MG/ML IV SOLN: 10.0000 mg | INTRAVENOUS | Status: DC | PRN

## 2021-02-27 MED ORDER — MIDAZOLAM HCL 2 MG/2ML IJ SOLN
INTRAMUSCULAR | Status: DC | PRN
  Administered 2021-02-27 (×2): 1 mg via INTRAVENOUS

## 2021-02-27 MED ORDER — FENTANYL CITRATE (PF) 100 MCG/2ML IJ SOLN
INTRAMUSCULAR | Status: DC | PRN
  Administered 2021-02-27 (×2): 25 ug via INTRAVENOUS

## 2021-02-27 MED ORDER — SODIUM CHLORIDE 0.9% FLUSH: 3.0000 mL | Freq: Two times a day (BID) | INTRAVENOUS | Status: DC

## 2021-02-27 MED ORDER — ASPIRIN 81 MG PO CHEW
81.0000 mg | CHEWABLE_TABLET | ORAL | Status: AC
  Administered 2021-02-27: 81 mg via ORAL
  Filled 2021-02-27: qty 1

## 2021-02-27 MED ORDER — VERAPAMIL HCL 2.5 MG/ML IV SOLN
INTRAVENOUS | Status: AC
  Filled 2021-02-27: qty 2

## 2021-02-27 MED ORDER — HEPARIN (PORCINE) IN NACL 1000-0.9 UT/500ML-% IV SOLN
INTRAVENOUS | Status: DC | PRN
  Administered 2021-02-27 (×2): 500 mL

## 2021-02-27 MED ORDER — IOHEXOL 350 MG/ML SOLN
INTRAVENOUS | Status: DC | PRN
Start: 2021-02-27 — End: 2021-02-27
  Administered 2021-02-27: 55 mL

## 2021-02-27 MED ORDER — HEPARIN (PORCINE) IN NACL 1000-0.9 UT/500ML-% IV SOLN
INTRAVENOUS | Status: AC
  Filled 2021-02-27: qty 1000

## 2021-02-27 MED ORDER — HEPARIN SODIUM (PORCINE) 1000 UNIT/ML IJ SOLN
INTRAMUSCULAR | Status: DC | PRN
Start: 2021-02-27 — End: 2021-02-27
  Administered 2021-02-27: 3000 [IU] via INTRAVENOUS

## 2021-02-27 MED ORDER — DEXTROSE 50 % IV SOLN: 12.5000 g | INTRAVENOUS | Status: DC

## 2021-02-27 MED ORDER — IOHEXOL 350 MG/ML SOLN
INTRAVENOUS | Status: AC
  Filled 2021-02-27: qty 1

## 2021-02-27 MED ORDER — ACETAMINOPHEN 325 MG PO TABS: 650.0000 mg | ORAL_TABLET | ORAL | Status: DC | PRN

## 2021-02-27 MED ORDER — FENTANYL CITRATE (PF) 100 MCG/2ML IJ SOLN
INTRAMUSCULAR | Status: AC
  Filled 2021-02-27: qty 2

## 2021-02-27 MED ORDER — HYDRALAZINE HCL 20 MG/ML IJ SOLN: 10.0000 mg | INTRAMUSCULAR | Status: DC | PRN

## 2021-02-27 MED ORDER — LIDOCAINE HCL (PF) 1 % IJ SOLN
INTRAMUSCULAR | Status: AC
  Filled 2021-02-27: qty 30

## 2021-02-27 MED ORDER — SODIUM CHLORIDE 0.9% FLUSH: 3.0000 mL | INTRAVENOUS | Status: DC | PRN

## 2021-02-27 MED ORDER — VERAPAMIL HCL 2.5 MG/ML IV SOLN
INTRAVENOUS | Status: DC | PRN
  Administered 2021-02-27: 10 mL via INTRA_ARTERIAL

## 2021-02-27 MED ORDER — ONDANSETRON HCL 4 MG/2ML IJ SOLN: 4.0000 mg | Freq: Four times a day (QID) | INTRAMUSCULAR | Status: DC | PRN

## 2021-02-27 SURGICAL SUPPLY — 17 items
CATH EXPO 5F MPA-1 (CATHETERS) ×2 IMPLANT
CATH INFINITI 5 FR AR1 MOD (CATHETERS) ×2 IMPLANT
CATH INFINITI 5 FR LCB (CATHETERS) ×2 IMPLANT
CATH INFINITI 5FR AL1 (CATHETERS) ×2 IMPLANT
CATH INFINITI MULTIPACK ANG 4F (CATHETERS) ×2 IMPLANT
CATH SWAN GANZ 7F STRAIGHT (CATHETERS) ×2 IMPLANT
DEVICE RAD COMP TR BAND LRG (VASCULAR PRODUCTS) ×2 IMPLANT
GLIDESHEATH SLEND SS 6F .021 (SHEATH) ×2 IMPLANT
GLIDESHEATH SLENDER 7FR .021G (SHEATH) ×2 IMPLANT
GUIDEWIRE .025 260CM (WIRE) ×2 IMPLANT
GUIDEWIRE INQWIRE 1.5J.035X260 (WIRE) ×1 IMPLANT
INQWIRE 1.5J .035X260CM (WIRE) ×2
PACK CARDIAC CATHETERIZATION (CUSTOM PROCEDURE TRAY) ×2 IMPLANT
SHEATH PROBE COVER 6X72 (BAG) ×2 IMPLANT
TRANSDUCER W/STOPCOCK (MISCELLANEOUS) ×2 IMPLANT
TUBING CIL FLEX 10 FLL-RA (TUBING) ×2 IMPLANT
WIRE EMERALD 3MM-J .025X260CM (WIRE) ×2 IMPLANT

## 2021-02-27 NOTE — Discharge Instructions (Signed)
Drink plenty of fluids for 48 hours and keep wrist elevated at heart level for 24 hours  Radial Site Care   This sheet gives you information about how to care for yourself after your procedure. Your health care provider may also give you more specific instructions. If you have problems or questions, contact your health care provider. What can I expect after the procedure? After the procedure, it is common to have:  Bruising and tenderness at the catheter insertion area. Follow these instructions at home: Medicines  Take over-the-counter and prescription medicines only as told by your health care provider. Insertion site care 1. Follow instructions from your health care provider about how to take care of your insertion site. Make sure you: ? Wash your hands with soap and water before you change your bandage (dressing). If soap and water are not available, use hand sanitizer. ? Remove your dressing as told by your health care provider. In 24 hours 2. Check your insertion site every day for signs of infection. Check for: ? Redness, swelling, or pain. ? Fluid or blood. ? Pus or a bad smell. ? Warmth. 3. Do not take baths, swim, or use a hot tub until your health care provider approves. 4. You may shower 24-48 hours after the procedure, or as directed by your health care provider. ? Remove the dressing and gently wash the site with plain soap and water. ? Pat the area dry with a clean towel. ? Do not rub the site. That could cause bleeding. 5. Do not apply powder or lotion to the site. Activity   1. For 24 hours after the procedure, or as directed by your health care provider: ? Do not flex or bend the affected arm. ? Do not push or pull heavy objects with the affected arm. ? Do not drive yourself home from the hospital or clinic. You may drive 24 hours after the procedure unless your health care provider tells you not to. ? Do not operate machinery or power tools. 2. Do not lift  anything that is heavier than 10 lb (4.5 kg), or the limit that you are told, until your health care provider says that it is safe.  For 4 days 3. Ask your health care provider when it is okay to: ? Return to work or school. ? Resume usual physical activities or sports. ? Resume sexual activity. General instructions  If the catheter site starts to bleed, raise your arm and put firm pressure on the site. If the bleeding does not stop, get help right away. This is a medical emergency.  If you went home on the same day as your procedure, a responsible adult should be with you for the first 24 hours after you arrive home.  Keep all follow-up visits as told by your health care provider. This is important. Contact a health care provider if:  You have a fever.  You have redness, swelling, or yellow drainage around your insertion site. Get help right away if:  You have unusual pain at the radial site.  The catheter insertion area swells very fast.  The insertion area is bleeding, and the bleeding does not stop when you hold steady pressure on the area.  Your arm or hand becomes pale, cool, tingly, or numb. These symptoms may represent a serious problem that is an emergency. Do not wait to see if the symptoms will go away. Get medical help right away. Call your local emergency services (911 in the [place] Do   not drive yourself to the hospital. Summary  After the procedure, it is common to have bruising and tenderness at the site.  Follow instructions from your health care provider about how to take care of your radial site wound. Check the wound every day for signs of infection.  Do not lift anything that is heavier than 10 lb (4.5 kg), or the limit that you are told, until your health care provider says that it is safe. This information is not intended to replace advice given to you by your health care provider. Make sure you discuss any questions you have with your health care  provider. Document Revised: 11/23/2017 Document Reviewed: 11/23/2017 Elsevier Patient Education  2020 [employer]vier Inc[employer]  

## 2021-02-27 NOTE — Interval H&P Note (Signed)
History and Physical Interval Note:  02/27/2021 8:18 AM  Vincent Brown  has presented today for surgery, with the diagnosis of heart failure.  The various methods of treatment have been discussed with the patient and family. After consideration of risks, benefits and other options for treatment, the patient has consented to  Procedure(s): RIGHT/LEFT HEART CATH AND CORONARY/GRAFT ANGIOGRAPHY (N/A) and possible coronary angioplasty as a surgical intervention.  The patient's history has been reviewed, patient examined, no change in status, stable for surgery.  I have reviewed the patient's chart and labs.  Questions were answered to the patient's satisfaction.     Taiquan Campanaro

## 2021-02-27 NOTE — Progress Notes (Signed)
cbg on arrival was  55.  Patient asymptomatic- IV started and 1/2 Amp D 50 given . 7902 cBG now 119.

## 2021-02-27 NOTE — Progress Notes (Signed)
Pt with CBG of 59 post cath. Juice given. 30 minute recheck CBG was 79.

## 2021-03-02 ENCOUNTER — Other Ambulatory Visit: Payer: Self-pay

## 2021-03-02 ENCOUNTER — Encounter (HOSPITAL_COMMUNITY): Payer: Self-pay | Admitting: Internal Medicine

## 2021-03-02 ENCOUNTER — Other Ambulatory Visit: Payer: Self-pay | Admitting: Family Medicine

## 2021-03-03 ENCOUNTER — Other Ambulatory Visit: Payer: Self-pay

## 2021-03-03 MED ORDER — PANTOPRAZOLE SODIUM 20 MG PO TBEC
DELAYED_RELEASE_TABLET | ORAL | 2 refills | Status: DC
  Filled 2021-03-03: qty 60, 30d supply, fill #0

## 2021-03-19 DIAGNOSIS — H2513 Age-related nuclear cataract, bilateral: Secondary | ICD-10-CM | POA: Diagnosis not present

## 2021-03-19 DIAGNOSIS — E113592 Type 2 diabetes mellitus with proliferative diabetic retinopathy without macular edema, left eye: Secondary | ICD-10-CM | POA: Diagnosis not present

## 2021-03-20 ENCOUNTER — Other Ambulatory Visit: Payer: Self-pay

## 2021-03-20 ENCOUNTER — Other Ambulatory Visit: Payer: Self-pay | Admitting: Family Medicine

## 2021-03-20 MED ORDER — ATORVASTATIN CALCIUM 20 MG PO TABS
1.0000 | ORAL_TABLET | Freq: Every day | ORAL | 1 refills | Status: AC
  Filled 2021-03-20: qty 90, 90d supply, fill #0
  Filled 2021-08-09: qty 90, 90d supply, fill #1

## 2021-03-20 MED FILL — Digoxin Tab 125 MCG (0.125 MG): ORAL | 30 days supply | Qty: 30 | Fill #1 | Status: AC

## 2021-04-06 ENCOUNTER — Encounter (HOSPITAL_COMMUNITY): Payer: Self-pay

## 2021-04-06 NOTE — Telephone Encounter (Signed)
Lets refer to [facility] before we order any further scans and see what they think

## 2021-04-08 ENCOUNTER — Other Ambulatory Visit: Payer: Self-pay

## 2021-04-08 ENCOUNTER — Telehealth: Payer: Self-pay | Admitting: Gastroenterology

## 2021-04-08 MED ORDER — PANTOPRAZOLE SODIUM 20 MG PO TBEC
DELAYED_RELEASE_TABLET | ORAL | 2 refills | Status: DC
  Filled 2021-04-08: qty 180, 90d supply, fill #0

## 2021-04-08 MED ORDER — FAMOTIDINE 20 MG PO TABS
20.0000 mg | ORAL_TABLET | Freq: Every evening | ORAL | 0 refills | Status: DC | PRN
  Filled 2021-04-08: qty 30, 30d supply, fill #0

## 2021-04-08 NOTE — Telephone Encounter (Signed)
Please advise patient to continue protonix before breakfast and dinner. Use Pepcid at bedtime as needed. Small frequent meals and antireflux measures. Please bring him sooner for office visit with me or APP. Thanks

## 2021-04-08 NOTE — Telephone Encounter (Signed)
Discussed with the wife. Declines earlier appointment. 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

## 2021-04-08 NOTE — Telephone Encounter (Signed)
Patients wife called said the patient has severe left quadrant pain he was admitted to the hospital in April and since then has lost a lot of weight. Seeking advise

## 2021-04-08 NOTE — Telephone Encounter (Signed)
Patient contacted. He had his wife speak with me.  He has had a discomfort in his chest. Cardiac issues were ruled out. Cardiology had him contact GI with his concerns. He was last seen here 2017 by [name]. Complains of weight loss, regurgitation of food and abdominal pain. "He has trouble eating because he cannot chew well due to his teeth and because his stomach hurts." PCP treated him "for H Pylori and ulcers." His symptoms have not improved. Appointment scheduled for 04/14/21. Recommended soft bland foods and small frequent meals in the meantime, focusing on good hydration.

## 2021-04-14 ENCOUNTER — Encounter: Payer: Self-pay | Admitting: Physician Assistant

## 2021-04-14 ENCOUNTER — Ambulatory Visit (INDEPENDENT_AMBULATORY_CARE_PROVIDER_SITE_OTHER): Payer: 59 | Admitting: Physician Assistant

## 2021-04-14 ENCOUNTER — Other Ambulatory Visit: Payer: Self-pay

## 2021-04-14 ENCOUNTER — Other Ambulatory Visit (INDEPENDENT_AMBULATORY_CARE_PROVIDER_SITE_OTHER): Payer: 59

## 2021-04-14 VITALS — BP 96/56 | HR 57 | Ht 68.0 in | Wt 122.0 lb

## 2021-04-14 DIAGNOSIS — R1011 Right upper quadrant pain: Secondary | ICD-10-CM

## 2021-04-14 DIAGNOSIS — K219 Gastro-esophageal reflux disease without esophagitis: Secondary | ICD-10-CM

## 2021-04-14 DIAGNOSIS — R634 Abnormal weight loss: Secondary | ICD-10-CM | POA: Diagnosis not present

## 2021-04-14 LAB — CBC WITH DIFFERENTIAL/PLATELET
Basophils Absolute: 0 10*3/uL (ref 0.0–0.1)
Basophils Relative: 0.7 % (ref 0.0–3.0)
Eosinophils Absolute: 0.1 10*3/uL (ref 0.0–0.7)
Eosinophils Relative: 1.5 % (ref 0.0–5.0)
HCT: 35.5 % — ABNORMAL LOW (ref 39.0–52.0)
Hemoglobin: 11.8 g/dL — ABNORMAL LOW (ref 13.0–17.0)
Lymphocytes Relative: 26.2 % (ref 12.0–46.0)
Lymphs Abs: 1.3 10*3/uL (ref 0.7–4.0)
MCHC: 33.1 g/dL (ref 30.0–36.0)
MCV: 85.2 fl (ref 78.0–100.0)
Monocytes Absolute: 0.4 10*3/uL (ref 0.1–1.0)
Monocytes Relative: 8.5 % (ref 3.0–12.0)
Neutro Abs: 3.2 10*3/uL (ref 1.4–7.7)
Neutrophils Relative %: 63.1 % (ref 43.0–77.0)
Platelets: 155 10*3/uL (ref 150.0–400.0)
RBC: 4.17 Mil/uL — ABNORMAL LOW (ref 4.22–5.81)
RDW: 14.1 % (ref 11.5–15.5)
WBC: 5.1 10*3/uL (ref 4.0–10.5)

## 2021-04-14 LAB — COMPREHENSIVE METABOLIC PANEL
ALT: 9 U/L (ref 0–53)
AST: 14 U/L (ref 0–37)
Albumin: 3.8 g/dL (ref 3.5–5.2)
Alkaline Phosphatase: 66 U/L (ref 39–117)
BUN: 27 mg/dL — ABNORMAL HIGH (ref 6–23)
CO2: 35 mEq/L — ABNORMAL HIGH (ref 19–32)
Calcium: 9.4 mg/dL (ref 8.4–10.5)
Chloride: 100 mEq/L (ref 96–112)
Creatinine, Ser: 0.96 mg/dL (ref 0.40–1.50)
GFR: 84.83 mL/min (ref >60.00)
Glucose, Bld: 131 mg/dL — ABNORMAL HIGH (ref 70–99)
Potassium: 4.6 mEq/L (ref 3.5–5.1)
Sodium: 137 mEq/L (ref 135–145)
Total Bilirubin: 0.5 mg/dL (ref 0.2–1.2)
Total Protein: 8.1 g/dL (ref 6.0–8.3)

## 2021-04-14 LAB — LIPASE: Lipase: 19 U/L (ref 11.0–59.0)

## 2021-04-14 MED ORDER — PANTOPRAZOLE SODIUM 40 MG PO TBEC
40.0000 mg | DELAYED_RELEASE_TABLET | Freq: Two times a day (BID) | ORAL | 5 refills | Status: DC
  Filled 2021-04-14: qty 180, 90d supply, fill #0

## 2021-04-14 NOTE — Progress Notes (Signed)
Chief Complaint: Reflux and weight loss  HPI:    Vincent Brown is a 60 year old male with a past medical history as listed below including chronic systolic CHF (02/27/2021 cardiac cath with EF 15-20%) status post CABG, CKD stage II, CAD and multiple others listed below, known to Dr. Lavon Paganini, who was referred to me by Dorothey Baseman, MD for a complaint of reflux and weight loss.      02/23/16 office visit Dr. Lavon Paganini.  That time being seen for iron deficiency anemia.  Noted a colonoscopy in 2015 which was normal per patient, EGD December 2016 showed mild antral erosive gastritis.  It was felt his chronic anemia was secondary to chronic disease due to inflammation/infection in addition to the B12 and iron deficiency.  At that time discussed patient had no overt signs of GI bleed and work-up negative so far for any significant etiology possible GI blood loss.  Agreed with supportive therapy with IV iron, B12 and erythropoietin if needed.  Was noted he was due for recall screening colonoscopy in 2025.  His PPI was increased to twice daily for reflux.    02/16/2021 CBC with hemoglobin normal baseline 11.5.    Today, the patient presents to clinic accompanied by his daughter.  They explained that for the past 6 months he has had various issues.  Describes some indigestion and reflux issues patient telling me he can oftentimes regurgitate his food after eating it.  This is happening regardless of his Pantoprazole 20 mg twice daily and the Pepcid 20 mg that was added at night.  Explains that he was tested for H. pylori within the past 4 months and treated twice.  Feels like maybe his symptoms are only "slightly better".    More concerning to him is that he has a right upper quadrant pain just under his rib cage which is worse with activity, if he sits around all day it does not hurt at all but the second that he gets up to do anything this is aggravated and rated as a 9/10.  It is hindering what he is able to do.   Does tell me that he slipped on the ice back in January and felt initially that this may be a broken rib or such but had an x-ray that was negative.  Also tells me he was head butted by a cow in this area.    Along with all the above patient has been experiencing weight loss.  His daughter tells me he has lost another 10 pounds just since being seen in April.    Denies fever, chills, change in bowel habits or blood in his stool.  Past Medical History:  Diagnosis Date   Anemia    a. 10/2015: symptomatic; requiring transfusion, EGD with antral gastritis. Anemia panel more suggestive of AOCD.   Bacteroides fragilis 11/19/2015   Chronic osteomyelitis of right tibia (HCC) 11/19/2015   Chronic systolic CHF (congestive heart failure) (HCC)    a. unable to tolerate lisinopril/ARB due to dizziness.    CKD (chronic kidney disease), stage II    Coronary artery disease 2012   a. s/p CABG - LIMA - LAD, SVG-OM2, SVG-PDA (11/2010). b. Abnl nuc 09/2015 - R/LHC 10/2015  R/LHC showed patent LIMA-LAD, and SVG-OM2, but occluded SVG-PDA; right heart pressures slightly elevated, otherwise no significant findings.   Diabetes mellitus 2003   Essential hypertension    Gastritis    Group B streptococcal infection 11/19/2015   Headache    Hyperlipidemia  Ischemic cardiomyopathy    Myocardial infarction (HCC)    Proliferative retinopathy 2015   treated with injection of Avastin.    Pseudomonas aeruginosa infection 11/19/2015   Serratia infection 11/19/2015   Shingles 2003   Shortness of breath dyspnea    Venous insufficiency    a. severe venous insufficiency with previous R leg ulcers requiring vein stripping.    Past Surgical History:  Procedure Laterality Date   APPENDECTOMY  1971   APPLICATION OF A-CELL OF EXTREMITY Right 10/24/2015   Procedure: APPLICATION OF A-CELL AND PLACEMENT OF WOUND VAC;  Surgeon: Alena Bills Dillingham, DO;  Location: [facility];  Service: Plastics;  Laterality: Right;   APPLICATION OF  A-CELL OF EXTREMITY Right 01/29/2016   Procedure: APPLICATION OF A-CELL OF EXTREMITY;  Surgeon: Alena Bills Dillingham, DO;  Location: [facility] OR;  Service: Plastics;  Laterality: Right;   APPLICATION OF A-CELL OF EXTREMITY Right 04/07/2016   Procedure: APPLICATION OF A-CELL OF EXTREMITY AND VAC DRESSING CHANGE;  Surgeon: Alena Bills Dillingham, DO;  Location: [facility] OR;  Service: Plastics;  Laterality: Right;   APPLICATION OF WOUND VAC Right 01/29/2016   Procedure: APPLICATION OF WOUND VAC;  Surgeon: Alena Bills Dillingham, DO;  Location: [facility];  Service: Plastics;  Laterality: Right;   CARDIAC CATHETERIZATION N/A 10/22/2015   Procedure: Right/Left Heart Cath and Coronary/Graft Angiography;  Surgeon: Dolores Patty, MD;  Location: [facility] INVASIVE CV LAB;  Service: Cardiovascular;  Laterality: N/A;   COLONOSCOPY  10/2012   Dr Silver Huguenin in [address]. mall sigmoid tics, small internal hemorrhoids.  otherwise normal screening study.  no polyps, telangectasia...   CORONARY ARTERY BYPASS GRAFT  11/05/2010   ESOPHAGOGASTRODUODENOSCOPY (EGD) WITH PROPOFOL N/A 10/21/2015   Procedure: ESOPHAGOGASTRODUODENOSCOPY (EGD) WITH PROPOFOL;  Surgeon: Napoleon Form, MD;  Location: [facility];  Service: Endoscopy;  Laterality: N/A;   FRACTURE SURGERY      wrist   I & D EXTREMITY Right 10/24/2015   Procedure: IRRIGATION AND DEBRIDEMENT EXTREMITY;  Surgeon: Alena Bills Dillingham, DO;  Location: [facility];  Service: Plastics;  Laterality: Right;   I & D EXTREMITY Right 01/29/2016   Procedure: RIGHT LEG WOUND IRRIGATION AND DEBRIDEMENT ;  Surgeon: Alena Bills Dillingham, DO;  Location: [facility];  Service: Plastics;  Laterality: Right;   REFRACTIVE SURGERY  2009, 2015   RIGHT/LEFT HEART CATH AND CORONARY/GRAFT ANGIOGRAPHY N/A 02/27/2021   Procedure: RIGHT/LEFT HEART CATH AND CORONARY/GRAFT ANGIOGRAPHY;  Surgeon: Dolores Patty, MD;  Location: [facility];  Service: Cardiovascular;  Laterality: N/A;   SKIN SPLIT GRAFT Right 04/07/2016    Procedure: SKIN GRAFT SPLIT THICKNESS TO RIGHT LOWER LEG;  Surgeon: Alena Bills Dillingham, DO;  Location: [facility];  Service: Plastics;  Laterality: Right;   VARICOSE VEIN SURGERY  1995   right lower extremity    Current Outpatient Medications  Medication Sig Dispense Refill   Acetaminophen 500 MG coapsule Take 500 mg by mouth every 4 (four) hours as needed for pain.     atorvastatin (LIPITOR) 20 MG tablet TAKE 1 TABLET BY MOUTH ONCE DAILY 90 tablet 1   cyclobenzaprine (FLEXERIL) 10 MG tablet Take 10 mg by mouth at bedtime as needed for muscle spasms.      digoxin (LANOXIN) 0.125 MG tablet TAKE 1 TABLET (0.125 MG TOTAL) BY MOUTH DAILY. (Patient taking differently: Take 0.125 mg by mouth daily.) 30 tablet 11   famotidine (PEPCID) 20 MG tablet Take 1 tablet (20 mg total) by mouth at bedtime as needed for  heartburn or indigestion. 30 tablet 0   furosemide (LASIX) 80 MG tablet Take 80 mg by mouth daily as needed for fluid or edema.     glimepiride (AMARYL) 4 MG tablet TAKE 1 TABLET BY MOUTH 2 TIMES DAILY (Patient taking differently: Take 4 mg by mouth 2 (two) times daily.) 180 tablet 1   metoprolol succinate (TOPROL-XL) 50 MG 24 hr tablet take 1 1/2 tablet by mouth once daily (Patient taking differently: Take 75 mg by mouth daily.) 45 tablet 6   pantoprazole (PROTONIX) 20 MG tablet TAKE 1 TABLET BY MOUTH TWO TIMES DAILY BEFORE MEALS 60 tablet 2   No current facility-administered medications for this visit.    Allergies as of 04/14/2021   (No Known Allergies)    Family History  Problem Relation Age of Onset   Hypertension Father    Other Father        Pacemaker   Parkinson's disease Father    Colon cancer Neg Hx     Social History   Socioeconomic History   Marital status: Married    Spouse name: Not on file   Number of children: 2   Years of education: Not on file   Highest education level: Not on file  Occupational History   Occupation: Works at [employer]  Tobacco Use   Smoking  status: Never   Smokeless tobacco: Never  Substance and Sexual Activity   Alcohol use: No   Drug use: No   Sexual activity: Not on file  Other Topics Concern   Not on file  Social History Narrative   Not on file   Social Determinants of Health   Financial Resource Strain: Not on file  Food Insecurity: Not on file  Transportation Needs: Not on file  Physical Activity: Not on file  Stress: Not on file  Social Connections: Not on file  Intimate Partner Violence: Not on file    Review of Systems:    Constitutional: No fever or chills Skin: No rash  Cardiovascular: No chest pain  Respiratory: No SOB  Gastrointestinal: See HPI and otherwise negative Genitourinary: No dysuria Neurological: No headache, dizziness or syncope Musculoskeletal: No new muscle or joint pain Hematologic: No bleeding Psychiatric: No history of depression or anxiety   Physical Exam:  Vital signs: BP (!) 96/56   Pulse (!) 57   Ht 5\' 8"  (1.727 m)   Wt 122 lb (55.3 kg)   BMI 18.55 kg/m    Constitutional:   Pleasant thin appearing Caucasian male appears to be in NAD, Well developed, Well nourished, alert and cooperative Head:  Normocephalic and atraumatic. Eyes:   PEERL, EOMI. No icterus. Conjunctiva pink. Ears:  Normal auditory acuity. Neck:  Supple Throat: Oral cavity and pharynx without inflammation, swelling or lesion.  Respiratory: Respirations even and unlabored. Lungs clear to auscultation bilaterally.   No wheezes, crackles, or rhonchi.  Cardiovascular: Normal S1, S2. No MRG. Regular rate and rhythm. No peripheral edema, cyanosis or pallor.  Gastrointestinal:  Soft, nondistended, marked ttp in RUQ with involuntary guarding, spreading to epigastrum, Normal bowel sounds. No appreciable masses or hepatomegaly. Rectal:  Not performed.  Msk:  Symmetrical without gross deformities. Without edema, no deformity or joint abnormality.  Neurologic:  Alert and  oriented x4;  grossly normal  neurologically.  Skin:   Dry and intact without significant lesions or rashes. Psychiatric: Demonstrates good judgement and reason without abnormal affect or behaviors.  RELEVANT LABS AND IMAGING: CBC    Component Value Date/Time  WBC 6.3 02/16/2021 1450   RBC 4.13 (L) 02/16/2021 1450   HGB 10.5 (L) 02/27/2021 0914   HGB 8.0 (L) 01/21/2016 1626   HCT 31.0 (L) 02/27/2021 0914   HCT 27.5 (L) 01/21/2016 1626   PLT 162 02/16/2021 1450   PLT 161 01/21/2016 1626   MCV 90.8 02/16/2021 1450   MCV 81.4 01/21/2016 1626   MCH 27.8 02/16/2021 1450   MCHC 30.7 02/16/2021 1450   RDW 13.7 02/16/2021 1450   RDW 17.5 (H) 01/21/2016 1626   LYMPHSABS 1.3 02/23/2016 1555   LYMPHSABS 1.6 01/21/2016 1626   MONOABS 0.4 02/23/2016 1555   MONOABS 0.4 01/21/2016 1626   EOSABS 0.1 02/23/2016 1555   EOSABS 0.0 01/21/2016 1626   BASOSABS 0.0 02/23/2016 1555   BASOSABS 0.0 01/21/2016 1626    CMP     Component Value Date/Time   NA 142 02/27/2021 0914   NA 134 (L) 01/21/2016 1626   K 4.0 02/27/2021 0914   K 4.1 01/21/2016 1626   CL 98 02/16/2021 1450   CO2 32 02/16/2021 1450   CO2 28 01/21/2016 1626   GLUCOSE 151 (H) 02/16/2021 1450   GLUCOSE 201 (H) 01/21/2016 1626   BUN 20 02/16/2021 1450   BUN 31.8 (H) 01/21/2016 1626   CREATININE 0.87 02/16/2021 1450   CREATININE 1.5 (H) 01/21/2016 1626   CALCIUM 9.4 02/16/2021 1450   CALCIUM 8.6 01/21/2016 1626   PROT 7.4 10/22/2019 1337   PROT 7.8 01/21/2016 1626   ALBUMIN 3.5 10/22/2019 1337   ALBUMIN 3.0 (L) 01/21/2016 1626   AST 16 10/22/2019 1337   AST 19 01/21/2016 1626   ALT 17 10/22/2019 1337   ALT 10 01/21/2016 1626   ALKPHOS 62 10/22/2019 1337   ALKPHOS 77 01/21/2016 1626   BILITOT 0.4 10/22/2019 1337   BILITOT 0.80 01/21/2016 1626   GFRNONAA >60 02/16/2021 1450   GFRAA >60 10/22/2019 1337    Assessment: 1.  Right upper quadrant pain: For the past 6 months, worse with movement; consider musculoskeletal versus underlying mass versus  other 2.  Weight loss: About 10 pounds over the past 2 months, more prior to that; consider malignancy 3.  GERD: Increase in indigestion over the past 6 months, treated for H. pylori twice, minimally better; consider continued H. Pylori+/- gastritis versus other  Plan: 1.  Ordered CT abdomen pelvis with contrast to rule out malignancy. 2.  Increase Pantoprazole to 40 mg twice daily, 30-6 minutes for breakfast and dinner.  Prescribed #60 with 5 refills.  Told him they can use to 20 mg tabs twice daily for now. 3.  Ordered labs including CBC, CMP and lipase. 4.  Patient does require procedures in the future he would need to be off of his blood thinner and these would be very high risk given his low EF.  They would need to be completed in the hospital 5.  Patient to follow in clinic per recommendations after labs above.  Hyacinth Meeker, PA-C [facility] 04/14/2021, 1:27 PM  Cc: Dorothey Baseman, MD

## 2021-04-14 NOTE — Patient Instructions (Signed)
If you are age 62 or older, your body mass index should be between 23-30. Your Body mass index is 18.55 kg/m. If this is out of the aforementioned range listed, please consider follow up with your Primary Care Provider.  If you are age 57 or younger, your body mass index should be between 19-25. Your Body mass index is 18.55 kg/m. If this is out of the aformentioned range listed, please consider follow up with your Primary Care Provider.   Your provider has requested that you go to the basement level for lab work before leaving today. Press "B" on the elevator. The lab is located at the first door on the left as you exit the elevator.   You have been scheduled for a CT scan of the abdomen and pelvis at [facility] ([address]---this is in the same building as [employer]).   You are scheduled on 04/17/21 at 11:30am. You should arrive 15 minutes prior to your appointment time for registration. Please follow the written instructions below on the day of your exam:  WARNING: IF YOU ARE ALLERGIC TO IODINE/X-RAY DYE, PLEASE NOTIFY RADIOLOGY IMMEDIATELY AT [phone]! YOU WILL BE GIVEN A 13 HOUR PREMEDICATION PREP.  1) Do not eat or drink anything after 7:30am (4 hours prior to your test) 2) You have been given 2 bottles of oral contrast to drink. The solution may taste better if refrigerated, but do NOT add ice or any other liquid to this solution. Shake well before drinking.    Drink 1 bottle of contrast @ 9:30am (2 hours prior to your exam)  Drink 1 bottle of contrast @ 10:30am (1 hour prior to your exam)  You may take any medications as prescribed with a small amount of water, if necessary. If you take any of the following medications: METFORMIN, GLUCOPHAGE, GLUCOVANCE, AVANDAMET, RIOMET, FORTAMET, [place] MET, JANUMET, GLUMETZA or METAGLIP, you MAY be asked to HOLD this medication 48 hours AFTER the exam.  The purpose of you drinking the oral contrast is to aid in  the visualization of your intestinal tract. The contrast solution may cause some diarrhea. Depending on your individual set of symptoms, you may also receive an intravenous injection of x-ray contrast/dye. Plan on being at [facility] for 30 minutes or longer, depending on the type of exam you are having performed.  This test typically takes 30-45 minutes to complete.  If you have any questions regarding your exam or if you need to reschedule, you may call the CT department at [phone] between the hours of 8:00 am and 5:00 pm, Monday-Friday.  ________________________________________________________________________   The [facility] providers would like to encourage you to use [facility] to communicate with providers for non-urgent requests or questions.  Due to long hold times on the telephone, sending your provider a message by [facility] may be a faster and more efficient way to get a response.  Please allow 48 business hours for a response.  Please remember that this is for non-urgent requests.   It was a pleasure to see you today!  Thank you for trusting me with your gastrointestinal care!    Ellouise Newer , PA-C

## 2021-04-15 NOTE — Progress Notes (Signed)
Reviewed and agree with documentation and assessment and plan. K. Veena Zaccheaus Storlie , MD   

## 2021-04-17 ENCOUNTER — Ambulatory Visit (INDEPENDENT_AMBULATORY_CARE_PROVIDER_SITE_OTHER)
Admission: RE | Admit: 2021-04-17 | Discharge: 2021-04-17 | Disposition: A | Discharge status: Home / Self Care | Payer: 59 | Source: Ambulatory Visit | Attending: Physician Assistant | Admitting: Physician Assistant

## 2021-04-17 ENCOUNTER — Other Ambulatory Visit: Payer: Self-pay

## 2021-04-17 DIAGNOSIS — N200 Calculus of kidney: Secondary | ICD-10-CM | POA: Diagnosis not present

## 2021-04-17 DIAGNOSIS — K219 Gastro-esophageal reflux disease without esophagitis: Secondary | ICD-10-CM | POA: Diagnosis not present

## 2021-04-17 DIAGNOSIS — R634 Abnormal weight loss: Secondary | ICD-10-CM

## 2021-04-17 MED ORDER — IOHEXOL 300 MG/ML  SOLN
80.0000 mL | Freq: Once | INTRAMUSCULAR | Status: AC | PRN
  Administered 2021-04-17: 80 mL via INTRAVENOUS

## 2021-04-17 MED ORDER — METOPROLOL SUCCINATE ER 50 MG PO TB24
ORAL_TABLET | ORAL | 0 refills | Status: DC
  Filled 2021-04-17: qty 135, 90d supply, fill #0

## 2021-04-20 ENCOUNTER — Other Ambulatory Visit: Payer: Self-pay

## 2021-04-20 DIAGNOSIS — R1011 Right upper quadrant pain: Secondary | ICD-10-CM

## 2021-04-24 NOTE — Progress Notes (Signed)
Subjective:    I'm seeing this patient as a consultation for:  Vincent Meeker, PA. Note will be routed back to referring provider/PCP.  CC: RUQ pain  I, Vincent Brown, LAT, ATC, am serving as scribe for Dr. Clementeen Graham.  HPI: Pt is a 60 y/o male presenting w/ c/o RUQ pain just under his rib cage since Jan/Feb 2022.  He has a complicated medical hx and has seen gastroenterology for these c/o as well as others.  He does report suffering a fall in January after slipping on the ice but does not feel that this pain is associated w/ this injury.  He had a triple bypass in 2012.  He locates his pain to his RUQ just below his distal ant ribcage.  Radiating pain: yes posteriorly to his R post ribcage Aggravating factors: activity; walking uphill; working w/ his hands overhead; riding tractor for several hours Treatments tried: antibiotics for hPylori;   Diagnostic imaging: CT abdomen/pelvis- 04/17/21  Past medical history, Surgical history, Family history, Social history, Allergies, and medications have been entered into the medical record, reviewed.   Review of Systems: No new headache, visual changes, nausea, vomiting, diarrhea, constipation, dizziness, abdominal pain, skin rash, fevers, chills, night sweats, weight loss, swollen lymph nodes, body aches, joint swelling, muscle aches, chest pain, shortness of breath, mood changes, visual or auditory hallucinations.   Objective:    Vitals:   04/27/21 1043  BP: 120/62  Pulse: 83  SpO2: 96%  Body mass index is 18.88 kg/m.  General: Thin to cachectic appearing man in no acute distress. Neuro/Psych: Alert and oriented x3, extra-ocular muscles intact, able to move all 4 extremities, sensation grossly intact. Skin: Warm and dry, no rashes noted.  Respiratory: Not using accessory muscles, speaking in full sentences, trachea midline.  Cardiovascular: Pulses palpable, no extremity edema. Abdomen: Thin.  Nondistended.  Tender palpation right and  left upper quadrants.  Pain is worse with palpation with flexed abdominal musculature.  No masses palpated.  No guarding or rebound. MSK: Decreased lumbar and thoracic motion. Abdomen tender to palpation as above.  Generalized decreased muscle bulk throughout upper and lower extremities and core.  Lab and Radiology Results  CT scan images of ribs, lumbar spine, and pelvis and hips visible on CT scan abdomen and pelvis obtained April 17, 2021 personally and independent interpreted today. No acute fractures.  Mild lumbar spine degeneration.  X-ray images T-spine obtained today personally and independently interpreted Sternotomy wires in place.  Decreased bone mineral density.  Mild thoracic spine DDD.  No acute fractures.  Atelectasis present inferior lung field Await formal radiology review  Impression and Recommendations:    Assessment and Plan: 60 y.o. male with abdominal pain.  Patient is already had a pretty thorough evaluation by gastroenterology and cardiology moved thought that his problem is probably musculoskeletal in nature.  He does have motion dependent pain in his abdomen which does indicate that is more likely to be a abdominal wall muscle issue.  Certainly that could be the case.  Plan to treat with physical therapy to work on core strengthening which may help.  Additionally differential diagnosis could include thoracic radiculopathy so we will obtain a T-spine x-ray.  On a more global perspective Vincent Brown appears to be in general poor health.  He is quite thin appearing.  Further evaluation may be needed if not improving with my treatment.  PDMP not reviewed this encounter. Orders Placed This Encounter  Procedures   DG Thoracic Spine 2 View  Standing Status:   Future    Number of Occurrences:   1    Standing Expiration Date:   04/27/2022    Order Specific Question:   Reason for Exam (SYMPTOM  OR DIAGNOSIS REQUIRED)    Answer:   eval pain abd pain suspect thor rad    Order  Specific Question:   Preferred imaging location?    Answer:   [facility]   Ambulatory referral to Physical Therapy    Referral Priority:   Routine    Referral Type:   Physical Medicine    Referral Reason:   Specialty Services Required    Requested Specialty:   Physical Therapy    Number of Visits Requested:   1   No orders of the defined types were placed in this encounter.   Discussed warning signs or symptoms. Please see discharge instructions. Patient expresses understanding.   The above documentation has been reviewed and is accurate and complete Clementeen Graham, M.D.

## 2021-04-27 ENCOUNTER — Other Ambulatory Visit: Payer: Self-pay

## 2021-04-27 ENCOUNTER — Ambulatory Visit (INDEPENDENT_AMBULATORY_CARE_PROVIDER_SITE_OTHER): Payer: 59 | Admitting: Family Medicine

## 2021-04-27 ENCOUNTER — Ambulatory Visit (INDEPENDENT_AMBULATORY_CARE_PROVIDER_SITE_OTHER): Payer: 59

## 2021-04-27 ENCOUNTER — Encounter: Payer: Self-pay | Admitting: Family Medicine

## 2021-04-27 VITALS — BP 120/62 | HR 83 | Ht 68.0 in | Wt 124.2 lb

## 2021-04-27 DIAGNOSIS — M7918 Myalgia, other site: Secondary | ICD-10-CM

## 2021-04-27 DIAGNOSIS — R1013 Epigastric pain: Secondary | ICD-10-CM

## 2021-04-27 DIAGNOSIS — M5414 Radiculopathy, thoracic region: Secondary | ICD-10-CM

## 2021-04-27 DIAGNOSIS — M5134 Other intervertebral disc degeneration, thoracic region: Secondary | ICD-10-CM | POA: Diagnosis not present

## 2021-04-27 MED FILL — Digoxin Tab 125 MCG (0.125 MG): ORAL | 30 days supply | Qty: 30 | Fill #2 | Status: AC

## 2021-04-27 NOTE — Patient Instructions (Signed)
Thank you for coming in today.   Please get an Xray today before you leave   I've referred you to Physical Therapy.  Let us know if you don't hear from them in one week.   Recheck in 6 weeks.   Let me know if this is not going well.

## 2021-04-28 NOTE — Progress Notes (Signed)
X-ray thoracic spine shows a little bit of arthritis.  Otherwise it looks okay.

## 2021-04-30 ENCOUNTER — Telehealth: Payer: Self-pay | Admitting: Family Medicine

## 2021-04-30 NOTE — Telephone Encounter (Signed)
Patient is not able to do therapy at [facility] due to his insurance. They asked that this be sent to the [facility] in [facility] ([address], [place] [address]).

## 2021-04-30 NOTE — Addendum Note (Signed)
Addended by: Edwena Felty T on: 04/30/2021 02:44 PM   Modules accepted: Orders

## 2021-05-04 ENCOUNTER — Other Ambulatory Visit: Payer: Self-pay | Admitting: Gastroenterology

## 2021-05-05 ENCOUNTER — Other Ambulatory Visit: Payer: Self-pay

## 2021-05-05 MED ORDER — FAMOTIDINE 20 MG PO TABS
20.0000 mg | ORAL_TABLET | Freq: Every evening | ORAL | 0 refills | Status: DC | PRN
  Filled 2021-05-05: qty 30, 30d supply, fill #0

## 2021-05-10 ENCOUNTER — Other Ambulatory Visit: Payer: Self-pay

## 2021-05-11 ENCOUNTER — Other Ambulatory Visit: Payer: Self-pay

## 2021-05-12 ENCOUNTER — Other Ambulatory Visit: Payer: Self-pay

## 2021-05-12 MED ORDER — GLIMEPIRIDE 4 MG PO TABS
ORAL_TABLET | ORAL | 1 refills | Status: AC
  Filled 2021-05-12: qty 180, 90d supply, fill #0
  Filled 2021-08-24: qty 180, 90d supply, fill #1

## 2021-06-01 ENCOUNTER — Ambulatory Visit: Payer: 59 | Attending: Family Medicine

## 2021-06-01 DIAGNOSIS — M5414 Radiculopathy, thoracic region: Secondary | ICD-10-CM | POA: Insufficient documentation

## 2021-06-01 DIAGNOSIS — M546 Pain in thoracic spine: Secondary | ICD-10-CM | POA: Diagnosis not present

## 2021-06-01 NOTE — Patient Instructions (Signed)
Access Code: [email or web address] URL: [email or web address] Date: 06/01/2021 Prepared by: Loralyn Freshwater  Exercises Seated Trunk Rotation - Arms Crossed - 2 x daily - 7 x weekly - 2 sets - 10 reps - 5 seconds hold Seated Scapular Retraction - 3 x daily - 7 x weekly - 1 sets - 10 reps - 5 seconds hold

## 2021-06-01 NOTE — Therapy (Signed)
[facility] [address] [place], [address] Phone: [phone]   Fax:  [phone]  Physical Therapy Evaluation  Patient Details  Name: Vincent Brown MRN: [id number] Date of Birth: October 25, 1961 Referring Provider (PT): Rodolph Bong, MD  Encounter Date: 06/01/2021   PT End of Session - 06/01/21 1009     Visit Number 1    Number of Visits 17    Date for PT Re-Evaluation 07/30/21    Authorization Type 1    Authorization Time Period of 10    PT Start Time 1009    PT Stop Time 1054    PT Time Calculation (min) 45 min    Activity Tolerance Patient tolerated treatment well;Patient limited by fatigue    Behavior During Therapy [facility] for tasks assessed/performed             Past Medical History:  Diagnosis Date   Anemia    a. 10/2015: symptomatic; requiring transfusion, EGD with antral gastritis. Anemia panel more suggestive of AOCD.   Bacteroides fragilis 11/19/2015   Chronic osteomyelitis of right tibia (HCC) 11/19/2015   Chronic systolic CHF (congestive heart failure) (HCC)    a. unable to tolerate lisinopril/ARB due to dizziness.    CKD (chronic kidney disease), stage II    Coronary artery disease 2012   a. s/p CABG - LIMA - LAD, SVG-OM2, SVG-PDA (11/2010). b. Abnl nuc 09/2015 - R/LHC 10/2015  R/LHC showed patent LIMA-LAD, and SVG-OM2, but occluded SVG-PDA; right heart pressures slightly elevated, otherwise no significant findings.   Diabetes mellitus 2003   Essential hypertension    Gastritis    Group B streptococcal infection 11/19/2015   Headache    Hyperlipidemia    Ischemic cardiomyopathy    Myocardial infarction (HCC)    Proliferative retinopathy 2015   treated with injection of Avastin.    Pseudomonas aeruginosa infection 11/19/2015   Serratia infection 11/19/2015   Shingles 2003   Shortness of breath dyspnea    Venous insufficiency    a. severe venous insufficiency with previous R leg ulcers  requiring vein stripping.    Past Surgical History:  Procedure Laterality Date   APPENDECTOMY  1971   APPLICATION OF A-CELL OF EXTREMITY Right 10/24/2015   Procedure: APPLICATION OF A-CELL AND PLACEMENT OF WOUND VAC;  Surgeon: Alena Bills Dillingham, DO;  Location: [facility] OR;  Service: Plastics;  Laterality: Right;   APPLICATION OF A-CELL OF EXTREMITY Right 01/29/2016   Procedure: APPLICATION OF A-CELL OF EXTREMITY;  Surgeon: Alena Bills Dillingham, DO;  Location: [facility] OR;  Service: Plastics;  Laterality: Right;   APPLICATION OF A-CELL OF EXTREMITY Right 04/07/2016   Procedure: APPLICATION OF A-CELL OF EXTREMITY AND VAC DRESSING CHANGE;  Surgeon: Alena Bills Dillingham, DO;  Location: [facility] OR;  Service: Plastics;  Laterality: Right;   APPLICATION OF WOUND VAC Right 01/29/2016   Procedure: APPLICATION OF WOUND VAC;  Surgeon: Alena Bills Dillingham, DO;  Location: [facility] OR;  Service: Plastics;  Laterality: Right;   CARDIAC CATHETERIZATION N/A 10/22/2015   Procedure: Right/Left Heart Cath and Coronary/Graft Angiography;  Surgeon: Dolores Patty, MD;  Location: [facility] INVASIVE CV LAB;  Service: Cardiovascular;  Laterality: N/A;   COLONOSCOPY  10/2012   Dr Silver Huguenin in [place]. mall sigmoid tics, small internal hemorrhoids.  otherwise normal screening study.  no polyps, telangectasia...   CORONARY ARTERY BYPASS GRAFT  11/05/2010   ESOPHAGOGASTRODUODENOSCOPY (EGD) WITH PROPOFOL N/A 10/21/2015   Procedure: ESOPHAGOGASTRODUODENOSCOPY (EGD) WITH PROPOFOL;  Surgeon: Napoleon FormKavitha Nandigam V, MD;  [facility];  Service: Endoscopy;  Laterality: N/A;   FRACTURE SURGERY      wrist   I & D EXTREMITY Right 10/24/2015   Procedure: IRRIGATION AND DEBRIDEMENT EXTREMITY;  Surgeon: Alena Billslaire S Dillingham, DO;  Location: [facility] OR;  Service: Plastics;  Laterality: Right;   I & D EXTREMITY Right 01/29/2016   Procedure: RIGHT LEG WOUND IRRIGATION AND DEBRIDEMENT ;  Surgeon: Alena Billslaire S Dillingham, DO;  Location: [facility] OR;  Service: Plastics;   Laterality: Right;   REFRACTIVE SURGERY  2009, 2015   RIGHT/LEFT HEART CATH AND CORONARY/GRAFT ANGIOGRAPHY N/A 02/27/2021   Procedure: RIGHT/LEFT HEART CATH AND CORONARY/GRAFT ANGIOGRAPHY;  Surgeon: Dolores PattyBensimhon, Daniel R, MD;  Location: [facility] INVASIVE CV LAB;  Service: Cardiovascular;  Laterality: N/A;   SKIN SPLIT GRAFT Right 04/07/2016   Procedure: SKIN GRAFT SPLIT THICKNESS TO RIGHT LOWER LEG;  Surgeon: Alena Billslaire S Dillingham, DO;  Location: [facility] OR;  Service: Plastics;  Laterality: Right;   VARICOSE VEIN SURGERY  1995   right lower extremity    There were no vitals filed for this visit.    Subjective Assessment - 06/01/21 1018     Subjective R thoracic pain: 0/10 currently, 6/10 at worst for the past 3 months.    Pertinent History Epigastric and abdominal pain. Pain is located predominanly R 12th rib area, also feels pain L 12th rib area as well. Pain began around January/February 2022. Gradual onset. Pt walked to feed the cows. Throws the bails of hay of about 30-40 lbs (pt bent over while throwing hay to the R and L) Walking uphill botherss his R trunk. No findings with GI or cardiaology appointment. Denies loss of bowel or bladder control or LE paresthesia. Pt is R hand dominant. Pain has stayed the same since onset.    Patient Stated Goals Decrease pain.    Currently in Pain? No/denies    Pain Score 0-No pain    Pain Location Thoracic    Pain Descriptors / Indicators Aching    Pain Type Chronic pain    Pain Onset More than a month ago    Pain Frequency Occasional    Aggravating Factors  Breathing, sitting in his truck seat, walking up hill.    Pain Relieving Factors leaning back. resting his R arm on something                [facility] - 06/01/21 1017       Assessment   Medical Diagnosis R10.13 (ICD-10-CM) - Epigastric pain  M79.18 (ICD-10-CM) - Abdominal muscle pain    Referring Provider (PT) Rodolph Bongorey, Evan S, MD    Onset Date/Surgical Date 04/27/21      Precautions    Precaution Comments CHF      Balance Screen   Has the patient fallen in the past 6 months No    Has the patient had a decrease in activity level because of a fear of falling?  No    Is the patient reluctant to leave their home because of a fear of falling?  No      Home Environment   Additional Comments Pt lives in 1 story home with basement with wife. No steps to enter hom.      Observation/Other Assessments   Focus on Therapeutic Outcomes (FOTO)  Rib FOTO 54      Posture/Postural Control   Posture Comments L lateral shift, L lumbar convexity, kyphosis, B protracted shoulders, and neck, L shoulder  lower, L scapular wigning > R, R iliac crest and greater trochanter higher.      AROM   Right Shoulder Flexion --   [facility]   Right Shoulder ABduction --   [facility]   Lumbar Flexion WFL    Lumbar Extension [facility]    Lumbar - Right Side Bend [facility]    Lumbar - Left Side Bend WFL    Lumbar - Right Rotation [facility]    Lumbar - Left Rotation [facility]      Strength   Overall Strength Comments Manually resisted scapular retraction targeting lower trap 4-/5 B.    Right Shoulder Flexion 4+/5    Right Shoulder ABduction 4/5    Right Shoulder Internal Rotation 4/5    Right Shoulder External Rotation 4+/5    Left Shoulder Flexion 5/5    Left Shoulder ABduction 5/5    Left Shoulder Internal Rotation 4+/5    Left Shoulder External Rotation 4/5      Palpation   Palpation comment TTP R T12 TP and R 12th rib with reproduction of symptoms.      Special Tests   Other special tests (-) repeated flexion test      Ambulation/Gait   Gait Comments Gait: antalgic, decreased stance L LE, R lumbar side bend during R LE stance phase                        Objective measurements completed on examination: See above findings.  No latex allergies  Blood pressure L arm sitting, mechanically taken, normal cuff: 109/58, HR 83    Reproduction of symptoms with sitting upright and B shoulder flexion L >  R  Medbridge Access Code [email or web address]    Therapeutic exercise Seated L trunk rotation 10x2 with 5 second holds   Seated B scapular retraction 10x5 seconds    Rest breaks secondary to fatigue  Improved exercise technique, movement at target joints, use of target muscles after mod verbal, visual, tactile cues.       Response to treatment Pt tolerated session well without aggravation of pain.   Clinical impression Pt is a 60 year old male who came to physical therapy secondary to abdominal muscle/thoracic pain. He also demonstrates reproduction of symptoms with palpation to R 12th rib and R T2 TP, altered gait pattern and posture, trunk and scapular weakness, decreased activity tolerance, and difficulty walking up a hill as well as breathing at times due to pain. Pt will benefit from skilled physical therapy services to address the aforementioned deficits.               PT Education - 06/01/21 1130     Education Details ther-ex, HEP, plan of care    Person(s) Educated Patient    Methods Explanation;Demonstration;Tactile cues;Verbal cues;Handout    Comprehension Returned demonstration;Verbalized understanding              PT Short Term Goals - 06/01/21 1113       PT SHORT TERM GOAL #1   Title Pt will be independent with his initial HEP to decrease pain, improve ability to ambulate uphill more comfortably.    Baseline Pt has started his HEP (06/01/2021)    Time 3    Period Weeks    Status New    Target Date 06/25/21               PT Long Term Goals - 06/01/21 1114       PT LONG TERM  GOAL #1   Title Pt will have a decrease in thoracic pain to 3/10 or less at worst to promote ability to breath as well as ambulate uphill or sit on his truck seat more comfortably.    Baseline 6/10 thoracic pain at worst for the past 3 months (06/01/2021)    Time 8    Period Weeks    Status New    Target Date 07/30/21      PT LONG TERM GOAL #2   Title Pt will improve  bilateral scapular strength by at least 1/2 MMT grade to promote ability to breathe, as well as ambulate uphill more comfortably.    Baseline Manually resisted scapular retraction targeting the lower trap muscles 4-/5 R and L (06/01/2021)    Time 8    Period Weeks    Status New    Target Date 07/30/21      PT LONG TERM GOAL #3   Title Pt will improve his FOTO score by at least 10 points as a demonstration of improved function.    Baseline Rib FOTO 54 (06/01/2021)    Time 8    Period Weeks    Status New    Target Date 07/30/21                    Plan - 06/01/21 1110     Clinical Impression Statement Pt is a 60 year old male who came to physical therapy secondary to abdominal muscle/thoracic pain. He also demonstrates reproduction of symptoms with palpation to R 12th rib and R T2 TP, altered gait pattern and posture, trunk and scapular weakness, decreased activity tolerance, and difficulty walking up a hill as well as breathing at times due to pain. Pt will benefit from skilled physical therapy services to address the aforementioned deficits.    Personal Factors and Comorbidities Comorbidity 3+;Age;Fitness;Past/Current Experience;Time since onset of injury/illness/exacerbation    Comorbidities Anemia, CHF, CKD, CAD, DM, ischemic cardiomyopathy, dyspnea, venous insufficiency    Examination-Activity Limitations Locomotion Level;Sit    Stability/Clinical Decision Making Stable/Uncomplicated    Clinical Decision Making Low    Rehab Potential Fair    PT Frequency 2x / week    PT Duration 8 weeks    PT Treatment/Interventions Therapeutic activities;Therapeutic exercise;Neuromuscular re-education;Patient/family education;Manual techniques;Dry needling;Electrical Stimulation;Iontophoresis 4mg /ml Dexamethasone    PT Next Visit Plan posture, trunk scapular strengthening, manual techniques, modalities PRN    PT Home Exercise Plan Medbridge Access Code [email or web address]    Consulted and Agree with Plan  of Care Patient             Patient will benefit from skilled therapeutic intervention in order to improve the following deficits and impairments:  Pain, Improper body mechanics, Postural dysfunction, Decreased strength, Difficulty walking, Decreased endurance, Decreased activity tolerance  Visit Diagnosis: Pain in thoracic spine - Plan: PT plan of care cert/re-cert  Radiculopathy, thoracic region - Plan: PT plan of care cert/re-cert     Problem List Patient Active Problem List   Diagnosis Date Noted   Acute on chronic systolic (congestive) heart failure (HCC) 02/15/2016   Fever 02/04/2016   Normocytic anemia 01/21/2016   Chronic osteomyelitis of right tibia (HCC) 11/19/2015   Pseudomonas aeruginosa infection 11/19/2015   Serratia infection 11/19/2015   Group B streptococcal infection 11/19/2015   Bacteroides fragilis 11/19/2015   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) 11/07/2015   Symptomatic anemia    Wound infection  Congestive heart failure (HCC)    DOE (dyspnea on exertion)    Abnormal finding on thallium stress test    Venous stasis ulcer (HCC) 08/26/2015   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, [place], [address] Phone: [phone]   Fax:  [phone]  Name: Vincent Brown MRN: [id number] Date of Birth: 26-Apr-1961

## 2021-06-02 ENCOUNTER — Other Ambulatory Visit: Payer: Self-pay

## 2021-06-02 ENCOUNTER — Other Ambulatory Visit: Payer: Self-pay | Admitting: Gastroenterology

## 2021-06-02 MED ORDER — FAMOTIDINE 20 MG PO TABS
20.0000 mg | ORAL_TABLET | Freq: Every evening | ORAL | 0 refills | Status: DC | PRN
  Filled 2021-06-02: qty 30, 30d supply, fill #0

## 2021-06-02 MED FILL — Digoxin Tab 125 MCG (0.125 MG): ORAL | 30 days supply | Qty: 30 | Fill #3 | Status: AC

## 2021-06-03 ENCOUNTER — Ambulatory Visit: Payer: 59

## 2021-06-03 DIAGNOSIS — M546 Pain in thoracic spine: Secondary | ICD-10-CM | POA: Diagnosis not present

## 2021-06-03 DIAGNOSIS — M5414 Radiculopathy, thoracic region: Secondary | ICD-10-CM

## 2021-06-03 NOTE — Therapy (Signed)
[facility] [address] [place], [address] Phone: [phone]   Fax:  [phone]  Physical Therapy Treatment  Patient Details  Name: Vincent Brown MRN: [id number] Date of Birth: October 17, 1961 Referring Provider (PT): Rodolph Bong, MD   Encounter Date: 06/03/2021   PT End of Session - 06/03/21 1017     Visit Number 2    Number of Visits 17    Date for PT Re-Evaluation 07/30/21    Authorization Type 2    Authorization Time Period of 10    PT Start Time 1017    PT Stop Time 1047    PT Time Calculation (min) 30 min    Activity Tolerance Patient tolerated treatment well;Patient limited by fatigue    Behavior During Therapy [facility] for tasks assessed/performed             Past Medical History:  Diagnosis Date   Anemia    a. 10/2015: symptomatic; requiring transfusion, EGD with antral gastritis. Anemia panel more suggestive of AOCD.   Bacteroides fragilis 11/19/2015   Chronic osteomyelitis of right tibia (HCC) 11/19/2015   Chronic systolic CHF (congestive heart failure) (HCC)    a. unable to tolerate lisinopril/ARB due to dizziness.    CKD (chronic kidney disease), stage II    Coronary artery disease 2012   a. s/p CABG - LIMA - LAD, SVG-OM2, SVG-PDA (11/2010). b. Abnl nuc 09/2015 - R/LHC 10/2015  R/LHC showed patent LIMA-LAD, and SVG-OM2, but occluded SVG-PDA; right heart pressures slightly elevated, otherwise no significant findings.   Diabetes mellitus 2003   Essential hypertension    Gastritis    Group B streptococcal infection 11/19/2015   Headache    Hyperlipidemia    Ischemic cardiomyopathy    Myocardial infarction (HCC)    Proliferative retinopathy 2015   treated with injection of Avastin.    Pseudomonas aeruginosa infection 11/19/2015   Serratia infection 11/19/2015   Shingles 2003   Shortness of breath dyspnea    Venous insufficiency    a. severe venous insufficiency with previous R leg ulcers  requiring vein stripping.    Past Surgical History:  Procedure Laterality Date   APPENDECTOMY  1971   APPLICATION OF A-CELL OF EXTREMITY Right 10/24/2015   Procedure: APPLICATION OF A-CELL AND PLACEMENT OF WOUND VAC;  Surgeon: Alena Bills Dillingham, DO;  [facility];  Service: Plastics;  Laterality: Right;   APPLICATION OF A-CELL OF EXTREMITY Right 01/29/2016   Procedure: APPLICATION OF A-CELL OF EXTREMITY;  Surgeon: Alena Bills Dillingham, DO;  [facility];  Service: Plastics;  Laterality: Right;   APPLICATION OF A-CELL OF EXTREMITY Right 04/07/2016   Procedure: APPLICATION OF A-CELL OF EXTREMITY AND VAC DRESSING CHANGE;  Surgeon: Alena Bills Dillingham, DO;  [facility];  Service: Plastics;  Laterality: Right;   APPLICATION OF WOUND VAC Right 01/29/2016   Procedure: APPLICATION OF WOUND VAC;  Surgeon: Alena Bills Dillingham, DO;  [facility];  Service: Plastics;  Laterality: Right;   CARDIAC CATHETERIZATION N/A 10/22/2015   Procedure: Right/Left Heart Cath and Coronary/Graft Angiography;  Surgeon: Dolores Patty, MD;  Location: [facility] INVASIVE CV LAB;  Service: Cardiovascular;  Laterality: N/A;   COLONOSCOPY  10/2012   Dr Silver Huguenin in [place]. mall sigmoid tics, small internal hemorrhoids.  otherwise normal screening study.  no polyps, telangectasia...   CORONARY ARTERY BYPASS GRAFT  11/05/2010   ESOPHAGOGASTRODUODENOSCOPY (EGD) WITH PROPOFOL N/A 10/21/2015   Procedure: ESOPHAGOGASTRODUODENOSCOPY (EGD) WITH  PROPOFOL;  Surgeon: Napoleon Form, MD;  Location: [facility] ENDOSCOPY;  Service: Endoscopy;  Laterality: N/A;   FRACTURE SURGERY      wrist   I & D EXTREMITY Right 10/24/2015   Procedure: IRRIGATION AND DEBRIDEMENT EXTREMITY;  Surgeon: Alena Bills Dillingham, DO;  [facility];  Service: Plastics;  Laterality: Right;   I & D EXTREMITY Right 01/29/2016   Procedure: RIGHT LEG WOUND IRRIGATION AND DEBRIDEMENT ;  Surgeon: Alena Bills Dillingham, DO;  [facility];  Service: Plastics;   Laterality: Right;   REFRACTIVE SURGERY  2009, 2015   RIGHT/LEFT HEART CATH AND CORONARY/GRAFT ANGIOGRAPHY N/A 02/27/2021   Procedure: RIGHT/LEFT HEART CATH AND CORONARY/GRAFT ANGIOGRAPHY;  Surgeon: Dolores Patty, MD;  [facility] INV[facility] LAB;  Service: Cardiovascular;  Laterality: N/A;   SKIN SPLIT GRAFT Right 04/07/2016   Procedure: SKIN GRAFT SPLIT THICKNESS TO RIGHT LOWER LEG;  Surgeon: Alena Bills Dillingham, DO;  [facility];  Service: Plastics;  Laterality: Right;   VARICOSE VEIN SURGERY  1995   right lower extremity    There were no vitals filed for this visit.   Subjective Assessment - 06/03/21 1019     Subjective R side is pretty sore, 5/10 currently. Was alright after last session.    Pertinent History Epigastric and abdominal pain. Pain is located predominanly R 12th rib area, also feels pain L 12th rib area as well. Pain began around January/February 2022. Gradual onset. Pt walked to feed the cows. Throws the bails of hay of about 30-40 lbs (pt bent over while throwing hay to the R and L) Walking uphill botherss his R trunk. No findings with GI or cardiaology appointment. Denies loss of bowel or bladder control or LE paresthesia. Pt is R hand dominant. Pain has stayed the same since onset.    Patient Stated Goals Decrease pain.    Currently in Pain? Yes    Pain Score 5     Pain Location Thoracic    Pain Descriptors / Indicators Sore    Pain Onset More than a month ago                                       PT Education - 06/03/21 1024     Education Details ther-ex    Person(s) Educated Patient    Methods Explanation;Demonstration;Tactile cues;Verbal cues    Comprehension Returned demonstration;Verbalized understanding            Objective   No latex allergies        Reproduction of symptoms with sitting upright and B shoulder flexion L > R   Medbridge Access Code [email or web address]       Therapeutic exercise Seated L trunk  rotation 10x , then 10x2 with 5 second holds   Seated B scapular retraction 10x5 seconds for 2 sets  Decreased R rib soreness to 2/10 after aforementioned exercises  Seated manually resisted L upper trunk rotation isometrics in neutral to promote more neutral posture. 10x3 with 5 second holds.   Seated B shoulder extension isometrics, hand on knees 6x5 seconds, then 5x5 seconds for 2 sets   Seated manually resisted R lateral shift isometrics in neutral to promote more neutral posture 5x5 seconds, then 10x5 seconds for 2 sets  No R 12th rib soreness afterwards.      Therapeutic rest breaks provided secondary to fatigue   Improved exercise technique,  movement at target joints, use of target muscles after mod verbal, visual, tactile cues.            Response to treatment Pt tolerated session well without aggravation of pain.   Clinical impression Worked on promoting a more neutral trunk posture as well as trunk strengthening to help decrease stress to R 12th rib. No R 12th rib soreness reported after treatment. Rest breaks provided secondary to fatigue to promote muscle recovery for next exercise. 30 min session performed today so as to not overwork pt. Pt will benefit from skilled physical therapy services to address the aforementioned deficits.       PT Short Term Goals - 06/01/21 1113       PT SHORT TERM GOAL #1   Title Pt will be independent with his initial HEP to decrease pain, improve ability to ambulate uphill more comfortably.    Baseline Pt has started his HEP (06/01/2021)    Time 3    Period Weeks    Status New    Target Date 06/25/21               PT Long Term Goals - 06/01/21 1114       PT LONG TERM GOAL #1   Title Pt will have a decrease in thoracic pain to 3/10 or less at worst to promote ability to breath as well as ambulate uphill or sit on his truck seat more comfortably.    Baseline 6/10 thoracic pain at worst for the past 3 months (06/01/2021)     Time 8    Period Weeks    Status New    Target Date 07/30/21      PT LONG TERM GOAL #2   Title Pt will improve bilateral scapular strength by at least 1/2 MMT grade to promote ability to breathe, as well as ambulate uphill more comfortably.    Baseline Manually resisted scapular retraction targeting the lower trap muscles 4-/5 R and L (06/01/2021)    Time 8    Period Weeks    Status New    Target Date 07/30/21      PT LONG TERM GOAL #3   Title Pt will improve his FOTO score by at least 10 points as a demonstration of improved function.    Baseline Rib FOTO 54 (06/01/2021)    Time 8    Period Weeks    Status New    Target Date 07/30/21                   Plan - 06/03/21 1037     Clinical Impression Statement Worked on promoting a more neutral trunk posture as well as trunk strengthening to help decrease stress to R 12th rib. No R 12th rib soreness reported after treatment. Rest breaks provided secondary to fatigue to promote muscle recovery for next exercise. 30 min session performed today so as to not overwork pt. Pt will benefit from skilled physical therapy services to address the aforementioned deficits.    Personal Factors and Comorbidities Comorbidity 3+;Age;Fitness;Past/Current Experience;Time since onset of injury/illness/exacerbation    Comorbidities Anemia, CHF, CKD, CAD, DM, ischemic cardiomyopathy, dyspnea, venous insufficiency    Examination-Activity Limitations Locomotion Level;Sit    Stability/Clinical Decision Making Stable/Uncomplicated    Clinical Decision Making Low    Rehab Potential Fair    PT Frequency 2x / week    PT Duration 8 weeks    PT Treatment/Interventions Therapeutic activities;Therapeutic exercise;Neuromuscular re-education;Patient/family education;Manual techniques;Dry needling;Electrical Stimulation;Iontophoresis 4mg /ml  Dexamethasone    PT Next Visit Plan posture, trunk scapular strengthening, manual techniques, modalities PRN    PT Home  Exercise Plan Medbridge Access Code [email or web address]    Consulted and Agree with Plan of Care Patient             Patient will benefit from skilled therapeutic intervention in order to improve the following deficits and impairments:  Pain, Improper body mechanics, Postural dysfunction, Decreased strength, Difficulty walking, Decreased endurance, Decreased activity tolerance  Visit Diagnosis: Pain in thoracic spine  Radiculopathy, thoracic region     Problem List Patient Active Problem List   Diagnosis Date Noted   Acute on chronic systolic (congestive) heart failure (HCC) 02/15/2016   Fever 02/04/2016   Normocytic anemia 01/21/2016   Chronic osteomyelitis of right tibia (HCC) 11/19/2015   Pseudomonas aeruginosa infection 11/19/2015   Serratia infection 11/19/2015   Group B streptococcal infection 11/19/2015   Bacteroides fragilis 11/19/2015   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) 11/07/2015   Symptomatic anemia    Wound infection    Congestive heart failure (HCC)    DOE (dyspnea on exertion)    Abnormal finding on thallium stress test    Venous stasis ulcer (HCC) 08/26/2015   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 [place], [address] Phone: [phone]   Fax:  [phone]  Name: Vincent Brown MRN: [id number] Date of Birth: 1961-02-03

## 2021-06-08 ENCOUNTER — Ambulatory Visit: Payer: 59

## 2021-06-08 DIAGNOSIS — M546 Pain in thoracic spine: Secondary | ICD-10-CM

## 2021-06-08 DIAGNOSIS — M5414 Radiculopathy, thoracic region: Secondary | ICD-10-CM

## 2021-06-08 NOTE — Therapy (Signed)
[facility] [address] [place], [address] Phone: [phone]   Fax:  [phone]  Physical Therapy Treatment  Patient Details  Name: Vincent Brown MRN: [id number] Date of Birth: 1961-06-05 Referring Provider (PT): Rodolph Bong, MD   Encounter Date: 06/08/2021   PT End of Session - 06/08/21 1015     Visit Number 3    Number of Visits 17    Date for PT Re-Evaluation 07/30/21    Authorization Type 3    Authorization Time Period of 10    PT Start Time 1015    PT Stop Time 1057    PT Time Calculation (min) 42 min    Activity Tolerance Patient tolerated treatment well;Patient limited by fatigue    Behavior During Therapy [facility] for tasks assessed/performed             Past Medical History:  Diagnosis Date   Anemia    a. 10/2015: symptomatic; requiring transfusion, EGD with antral gastritis. Anemia panel more suggestive of AOCD.   Bacteroides fragilis 11/19/2015   Chronic osteomyelitis of right tibia (HCC) 11/19/2015   Chronic systolic CHF (congestive heart failure) (HCC)    a. unable to tolerate lisinopril/ARB due to dizziness.    CKD (chronic kidney disease), stage II    Coronary artery disease 2012   a. s/p CABG - LIMA - LAD, SVG-OM2, SVG-PDA (11/2010). b. Abnl nuc 09/2015 - R/LHC 10/2015  R/LHC showed patent LIMA-LAD, and SVG-OM2, but occluded SVG-PDA; right heart pressures slightly elevated, otherwise no significant findings.   Diabetes mellitus 2003   Essential hypertension    Gastritis    Group B streptococcal infection 11/19/2015   Headache    Hyperlipidemia    Ischemic cardiomyopathy    Myocardial infarction (HCC)    Proliferative retinopathy 2015   treated with injection of Avastin.    Pseudomonas aeruginosa infection 11/19/2015   Serratia infection 11/19/2015   Shingles 2003   Shortness of breath dyspnea    Venous insufficiency    a. severe venous insufficiency with previous R leg ulcers  requiring vein stripping.    Past Surgical History:  Procedure Laterality Date   APPENDECTOMY  1971   APPLICATION OF A-CELL OF EXTREMITY Right 10/24/2015   Procedure: APPLICATION OF A-CELL AND PLACEMENT OF WOUND VAC;  Surgeon: Alena Bills Dillingham, DO;  [facility];  Service: Plastics;  Laterality: Right;   APPLICATION OF A-CELL OF EXTREMITY Right 01/29/2016   Procedure: APPLICATION OF A-CELL OF EXTREMITY;  Surgeon: Alena Bills Dillingham, DO;  [facility]: [facility];  Service: Plastics;  Laterality: Right;   APPLICATION OF A-CELL OF EXTREMITY Right 04/07/2016   Procedure: APPLICATION OF A-CELL OF EXTREMITY AND VAC DRESSING CHANGE;  Surgeon: Alena Bills Dillingham, DO;  [facility]: [facility];  Service: Plastics;  Laterality: Right;   APPLICATION OF WOUND VAC Right 01/29/2016   Procedure: APPLICATION OF WOUND VAC;  Surgeon: Alena Bills Dillingham, DO;  [facility];  Service: Plastics;  Laterality: Right;   CARDIAC CATHETERIZATION N/A 10/22/2015   Procedure: Right/Left Heart Cath and Coronary/Graft Angiography;  Surgeon: Dolores Patty, MD;  Location: [facility] INVASIVE CV LAB;  Service: Cardiovascular;  Laterality: N/A;   COLONOSCOPY  10/2012   Dr Silver Huguenin in [place]. mall sigmoid tics, small internal hemorrhoids.  otherwise normal screening study.  no polyps, telangectasia...   CORONARY ARTERY BYPASS GRAFT  11/05/2010   ESOPHAGOGASTRODUODENOSCOPY (EGD) WITH PROPOFOL N/A 10/21/2015   Procedure: ESOPHAGOGASTRODUODENOSCOPY (EGD) WITH  PROPOFOL;  Surgeon: Napoleon Form, MD;  Location: [facility] ENDOSCOPY;  Service: Endoscopy;  Laterality: N/A;   FRACTURE SURGERY      wrist   I & D EXTREMITY Right 10/24/2015   Procedure: IRRIGATION AND DEBRIDEMENT EXTREMITY;  Surgeon: Alena Bills Dillingham, DO;  [facility];  Service: Plastics;  Laterality: Right;   I & D EXTREMITY Right 01/29/2016   Procedure: RIGHT LEG WOUND IRRIGATION AND DEBRIDEMENT ;  Surgeon: Alena Bills Dillingham, DO;  [facility];  Service: Plastics;   Laterality: Right;   REFRACTIVE SURGERY  2009, 2015   RIGHT/LEFT HEART CATH AND CORONARY/GRAFT ANGIOGRAPHY N/A 02/27/2021   Procedure: RIGHT/LEFT HEART CATH AND CORONARY/GRAFT ANGIOGRAPHY;  Surgeon: Dolores Patty, MD;  [facility] LAB;  Service: Cardiovascular;  Laterality: N/A;   SKIN SPLIT GRAFT Right 04/07/2016   Procedure: SKIN GRAFT SPLIT THICKNESS TO RIGHT LOWER LEG;  Surgeon: Alena Bills Dillingham, DO;  [facility];  Service: Plastics;  Laterality: Right;   VARICOSE VEIN SURGERY  1995   right lower extremity    There were no vitals filed for this visit.   Subjective Assessment - 06/08/21 1016     Subjective R rib is a little sore, 2/10 currently. Was not too bad after last session. Breathing comfort level is a little better. The seated L trunk rotation and scapular retraction bothers his R rib. Eases when he rests.    Pertinent History Epigastric and abdominal pain. Pain is located predominanly R 12th rib area, also feels pain L 12th rib area as well. Pain began around January/February 2022. Gradual onset. Pt walked to feed the cows. Throws the bails of hay of about 30-40 lbs (pt bent over while throwing hay to the R and L) Walking uphill botherss his R trunk. No findings with GI or cardiaology appointment. Denies loss of bowel or bladder control or LE paresthesia. Pt is R hand dominant. Pain has stayed the same since onset.    Patient Stated Goals Decrease pain.    Currently in Pain? Yes    Pain Score 2     Pain Onset More than a month ago                                       PT Education - 06/08/21 1029     Education Details ther-ex    [employer]) Educated Patient    Methods Explanation;Demonstration;Tactile cues;Verbal cues    Comprehension Verbalized understanding;Returned demonstration            Objective   No latex allergies        Reproduction of symptoms with sitting upright and B shoulder flexion L > R    Medbridge Access Code [email or web address]     Therapeutic exercise Seated L trunk rotation  10x with 5 second holds   Seated manually resisted L upper trunk rotation isometrics in neutral to promote more neutral posture. 10x3 with 5 second holds.  Seated manually resisted scapular retraction targeting lower trap  muscles to promote upper thoracic extension as well as opening of his chest area to improve ease of breathing   R 10x5 seconds for 3 sets  L 10x5 seconds for 3 sets  No soreness after aforementioned exercises    Seated manually resisted R lateral shift isometrics in neutral to promote more neutral posture 5x5 seconds, then 10x5 seconds for 3 sets  Seated thoracic extension over chair 10x5 seconds for 2 sets  Held off the seated L trunk rotation exercise at home, but keep doing the scapular retractions. Pt verbalized understanding.   Seated B shoulder extension isometrics, hands on thighs 10x5 seconds, and 4x5 seconds to promote trunk strength and to promote more neutral positioning of R 12th rib. Increased symptoms, eases with rest   Seated manually resisted thoracic extension isometrics 10x5 second for 3 sets  Seated B scapular retraction 10x5 seconds   Seated R shoulder adduction isometrics in neutral, PT manually resisted 10x5 seconds for 2 sets       Therapeutic rest breaks provided secondary to fatigue   Improved exercise technique, movement at target joints, use of target muscles after mod verbal, visual, tactile cues.         Response to treatment Fair tolerance to today's session.    Clinical impression Continued working on posture and trunk strengthening to help decrease stress to his R 12th rib. Fair tolerance to today's session. Therapeutic rest breaks provided secondary to fatigue to promote muscle recovery for next exercise. Improved exercise tolerance observed today.  Pt will benefit from skilled physical therapy services to address the aforementioned  deficits.     PT Short Term Goals - 06/01/21 1113       PT SHORT TERM GOAL #1   Title Pt will be independent with his initial HEP to decrease pain, improve ability to ambulate uphill more comfortably.    Baseline Pt has started his HEP (06/01/2021)    Time 3    Period Weeks    Status New    Target Date 06/25/21               PT Long Term Goals - 06/01/21 1114       PT LONG TERM GOAL #1   Title Pt will have a decrease in thoracic pain to 3/10 or less at worst to promote ability to breath as well as ambulate uphill or sit on his truck seat more comfortably.    Baseline 6/10 thoracic pain at worst for the past 3 months (06/01/2021)    Time 8    Period Weeks    Status New    Target Date 07/30/21      PT LONG TERM GOAL #2   Title Pt will improve bilateral scapular strength by at least 1/2 MMT grade to promote ability to breathe, as well as ambulate uphill more comfortably.    Baseline Manually resisted scapular retraction targeting the lower trap muscles 4-/5 R and L (06/01/2021)    Time 8    Period Weeks    Status New    Target Date 07/30/21      PT LONG TERM GOAL #3   Title Pt will improve his FOTO score by at least 10 points as a demonstration of improved function.    Baseline Rib FOTO 54 (06/01/2021)    Time 8    Period Weeks    Status New    Target Date 07/30/21                   Plan - 06/08/21 1015     Clinical Impression Statement Continued working on posture and trunk strengthening to help decrease stress to his R 12th rib. Fair tolerance to today's session. Therapeutic rest breaks provided secondary to fatigue to promote muscle recovery for next exercise. Improved exercise tolerance observed today.  Pt will benefit from skilled physical therapy services to  address the aforementioned deficits.    Personal Factors and Comorbidities Comorbidity 3+;Age;Fitness;Past/Current Experience;Time since onset of injury/illness/exacerbation    Comorbidities Anemia, CHF,  CKD, CAD, DM, ischemic cardiomyopathy, dyspnea, venous insufficiency    Examination-Activity Limitations Locomotion Level;Sit    Stability/Clinical Decision Making Stable/Uncomplicated    Rehab Potential Fair    PT Frequency 2x / week    PT Duration 8 weeks    PT Treatment/Interventions Therapeutic activities;Therapeutic exercise;Neuromuscular re-education;Patient/family education;Manual techniques;Dry needling;Electrical Stimulation;Iontophoresis 4mg /ml Dexamethasone    PT Next Visit Plan posture, trunk scapular strengthening, manual techniques, modalities PRN    PT Home Exercise Plan Medbridge Access Code [email or web address]    Consulted and Agree with Plan of Care Patient             Patient will benefit from skilled therapeutic intervention in order to improve the following deficits and impairments:  Pain, Improper body mechanics, Postural dysfunction, Decreased strength, Difficulty walking, Decreased endurance, Decreased activity tolerance  Visit Diagnosis: Pain in thoracic spine  Radiculopathy, thoracic region     Problem List Patient Active Problem List   Diagnosis Date Noted   Acute on chronic systolic (congestive) heart failure (HCC) 02/15/2016   Fever 02/04/2016   Normocytic anemia 01/21/2016   Chronic osteomyelitis of right tibia (HCC) 11/19/2015   Pseudomonas aeruginosa infection 11/19/2015   Serratia infection 11/19/2015   Group B streptococcal infection 11/19/2015   Bacteroides fragilis 11/19/2015   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) 11/07/2015   Symptomatic anemia    Wound infection    Congestive heart failure (HCC)    DOE (dyspnea on exertion)    Abnormal finding on thallium stress test    Venous stasis ulcer (HCC) 08/26/2015   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, [place], [address] Phone: [phone]   Fax:  [phone]  Name: Vincent Brown MRN: [id number] Date of Birth: 1961-08-05

## 2021-06-08 NOTE — Patient Instructions (Signed)
Held off the seated L trunk rotation exercise at home, but keep doing the scapular retractions. Pt verbalized understanding.

## 2021-06-10 ENCOUNTER — Ambulatory Visit: Payer: 59

## 2021-06-10 DIAGNOSIS — M5414 Radiculopathy, thoracic region: Secondary | ICD-10-CM | POA: Diagnosis not present

## 2021-06-10 DIAGNOSIS — M546 Pain in thoracic spine: Secondary | ICD-10-CM | POA: Diagnosis not present

## 2021-06-10 NOTE — Therapy (Signed)
[facility] [address], [place], [address] Phone: [phone]   Fax:  [phone]  Physical Therapy Treatment  Patient Details  Name: Vincent Brown MRN: [id number] Date of Birth: 10-Sep-1961 Referring Provider (PT): Rodolph Bong, MD   Encounter Date: 06/10/2021   PT End of Session - 06/10/21 1020     Visit Number 4    Number of Visits 17    Date for PT Re-Evaluation 07/30/21    Authorization Type 4    Authorization Time Period of 10    PT Start Time 1020    PT Stop Time 1100    PT Time Calculation (min) 40 min    Activity Tolerance Patient tolerated treatment well;Patient limited by fatigue    Behavior During Therapy [facility] for tasks assessed/performed             Past Medical History:  Diagnosis Date   Anemia    a. 10/2015: symptomatic; requiring transfusion, EGD with antral gastritis. Anemia panel more suggestive of AOCD.   Bacteroides fragilis 11/19/2015   Chronic osteomyelitis of right tibia (HCC) 11/19/2015   Chronic systolic CHF (congestive heart failure) (HCC)    a. unable to tolerate lisinopril/ARB due to dizziness.    CKD (chronic kidney disease), stage II    Coronary artery disease 2012   a. s/p CABG - LIMA - LAD, SVG-OM2, SVG-PDA (11/2010). b. Abnl nuc 09/2015 - R/LHC 10/2015  R/LHC showed patent LIMA-LAD, and SVG-OM2, but occluded SVG-PDA; right heart pressures slightly elevated, otherwise no significant findings.   Diabetes mellitus 2003   Essential hypertension    Gastritis    Group B streptococcal infection 11/19/2015   Headache    Hyperlipidemia    Ischemic cardiomyopathy    Myocardial infarction (HCC)    Proliferative retinopathy 2015   treated with injection of Avastin.    Pseudomonas aeruginosa infection 11/19/2015   Serratia infection 11/19/2015   Shingles 2003   Shortness of breath dyspnea    Venous insufficiency    a. severe venous insufficiency with previous R leg ulcers  requiring vein stripping.    Past Surgical History:  Procedure Laterality Date   APPENDECTOMY  1971   APPLICATION OF A-CELL OF EXTREMITY Right 10/24/2015   Procedure: APPLICATION OF A-CELL AND PLACEMENT OF WOUND VAC;  Surgeon: Alena Bills Dillingham, DO;  [facility];  Service: Plastics;  Laterality: Right;   APPLICATION OF A-CELL OF EXTREMITY Right 01/29/2016   Procedure: APPLICATION OF A-CELL OF EXTREMITY;  Surgeon: Alena Bills Dillingham, DO;  [facility]: [facility];  Service: Plastics;  Laterality: Right;   APPLICATION OF A-CELL OF EXTREMITY Right 04/07/2016   Procedure: APPLICATION OF A-CELL OF EXTREMITY AND VAC DRESSING CHANGE;  Surgeon: Alena Bills Dillingham, DO;  [facility];  Service: Plastics;  Laterality: Right;   APPLICATION OF WOUND VAC Right 01/29/2016   Procedure: APPLICATION OF WOUND VAC;  Surgeon: Alena Bills Dillingham, DO;  [facility];  Service: Plastics;  Laterality: Right;   CARDIAC CATHETERIZATION N/A 10/22/2015   Procedure: Right/Left Heart Cath and Coronary/Graft Angiography;  Surgeon: Dolores Patty, MD;  Location: [facility] INVASIVE CV LAB;  Service: Cardiovascular;  Laterality: N/A;   COLONOSCOPY  10/2012   Dr Silver Huguenin in [place]. mall sigmoid tics, small internal hemorrhoids.  otherwise normal screening study.  no polyps, telangectasia...   CORONARY ARTERY BYPASS GRAFT  11/05/2010   ESOPHAGOGASTRODUODENOSCOPY (EGD) WITH PROPOFOL N/A 10/21/2015   Procedure: ESOPHAGOGASTRODUODENOSCOPY (EGD) WITH  PROPOFOL;  Surgeon: Napoleon Form, MD;  Location: [facility] ENDOSCOPY;  Service: Endoscopy;  Laterality: N/A;   FRACTURE SURGERY      wrist   I & D EXTREMITY Right 10/24/2015   Procedure: IRRIGATION AND DEBRIDEMENT EXTREMITY;  Surgeon: Alena Bills Dillingham, DO;  [facility];  Service: Plastics;  Laterality: Right;   I & D EXTREMITY Right 01/29/2016   Procedure: RIGHT LEG WOUND IRRIGATION AND DEBRIDEMENT ;  Surgeon: Alena Bills Dillingham, DO;  [facility];  Service: Plastics;   Laterality: Right;   REFRACTIVE SURGERY  2009, 2015   RIGHT/LEFT HEART CATH AND CORONARY/GRAFT ANGIOGRAPHY N/A 02/27/2021   Procedure: RIGHT/LEFT HEART CATH AND CORONARY/GRAFT ANGIOGRAPHY;  Surgeon: Dolores Patty, MD;  [facility] INV[facility] LAB;  Service: Cardiovascular;  Laterality: N/A;   SKIN SPLIT GRAFT Right 04/07/2016   Procedure: SKIN GRAFT SPLIT THICKNESS TO RIGHT LOWER LEG;  Surgeon: Alena Bills Dillingham, DO;  [facility];  Service: Plastics;  Laterality: Right;   VARICOSE VEIN SURGERY  1995   right lower extremity    There were no vitals filed for this visit.   Subjective Assessment - 06/10/21 1023     Subjective R rib soreness. Has some L side soreness which he has had before. 3/10 R rib soreness currently    Pertinent History Epigastric and abdominal pain. Pain is located predominanly R 12th rib area, also feels pain L 12th rib area as well. Pain began around January/February 2022. Gradual onset. Pt walked to feed the cows. Throws the bails of hay of about 30-40 lbs (pt bent over while throwing hay to the R and L) Walking uphill botherss his R trunk. No findings with GI or cardiaology appointment. Denies loss of bowel or bladder control or LE paresthesia. Pt is R hand dominant. Pain has stayed the same since onset.    Patient Stated Goals Decrease pain.    Currently in Pain? Yes    Pain Score 3     Pain Onset More than a month ago                                       PT Education - 06/10/21 1034     Education Details ther-ex    [employer]) Educated Patient    Methods Explanation;Demonstration;Tactile cues;Verbal cues    Comprehension Returned demonstration;Verbalized understanding            Objective   No latex allergies        Reproduction of symptoms with sitting upright and B shoulder flexion L > R   Medbridge Access Code [email or web address]      Therapeutic exercise  Sitting up straight  Decreased R rib soreness to  2/10  Then for 1 minute x 3. Effort to hold posture reported by pt   Scapular retraction resisting yellow band in standing 10x3  Standing R lateral shift correction 10x3  Manually resisted L lateral shift isometrics in neutral to promote more neutral posture 10x5 seconds   Manually resisted R upper trunk rotation isometrics 10x3 with 5 second holds    Seated manually resisted scapular retraction targeting lower trap  muscles to promote upper thoracic extension as well as opening of his chest area to improve ease of breathing             R 10x5 seconds for 3 sets  L 10x5 seconds for 3 sets    Seated manually resisted L upper trunk rotation isometrics in neutral to promote more neutral posture. 10x3 with 5 second holds.      Therapeutic rest breaks provided secondary to fatigue   Improved exercise technique, movement at target joints, use of target muscles after mod verbal, visual, tactile cues.         Response to treatment Pt tolerated session well without aggravation of symptoms.    Clinical impression Decreased R rib discomfort to 2/10 and L rib discomfort remained a 3/10. Decreased symptoms with upright posture observed. Pt however seems to have difficulty maintaining it secondary to weakness and muscle atrophy. Therapeutic rest breaks provided secondary to fatigue to promote muscle recovery for next exercise. Pt tolerated session well without aggravation of symptoms. Pt will benefit from skilled physical therapy services to address the aforementioned deficits.      PT Short Term Goals - 06/01/21 1113       PT SHORT TERM GOAL #1   Title Pt will be independent with his initial HEP to decrease pain, improve ability to ambulate uphill more comfortably.    Baseline Pt has started his HEP (06/01/2021)    Time 3    Period Weeks    Status New    Target Date 06/25/21               PT Long Term Goals - 06/01/21 1114       PT LONG TERM GOAL #1   Title Pt  will have a decrease in thoracic pain to 3/10 or less at worst to promote ability to breath as well as ambulate uphill or sit on his truck seat more comfortably.    Baseline 6/10 thoracic pain at worst for the past 3 months (06/01/2021)    Time 8    Period Weeks    Status New    Target Date 07/30/21      PT LONG TERM GOAL #2   Title Pt will improve bilateral scapular strength by at least 1/2 MMT grade to promote ability to breathe, as well as ambulate uphill more comfortably.    Baseline Manually resisted scapular retraction targeting the lower trap muscles 4-/5 R and L (06/01/2021)    Time 8    Period Weeks    Status New    Target Date 07/30/21      PT LONG TERM GOAL #3   Title Pt will improve his FOTO score by at least 10 points as a demonstration of improved function.    Baseline Rib FOTO 54 (06/01/2021)    Time 8    Period Weeks    Status New    Target Date 07/30/21                   Plan - 06/10/21 1034     Clinical Impression Statement Decreased R rib discomfort to 2/10 and L rib discomfort remained a 3/10. Decreased symptoms with upright posture observed. Pt however seems to have difficulty maintaining it secondary to weakness and muscle atrophy. Therapeutic rest breaks provided secondary to fatigue to promote muscle recovery for next exercise. Pt tolerated session well without aggravation of symptoms. Pt will benefit from skilled physical therapy services to address the aforementioned deficits.    Personal Factors and Comorbidities Comorbidity 3+;Age;Fitness;Past/Current Experience;Time since onset of injury/illness/exacerbation    Comorbidities Anemia, CHF, CKD, CAD, DM, ischemic cardiomyopathy, dyspnea, venous insufficiency    Examination-Activity Limitations Locomotion Level;Sit  Stability/Clinical Decision Making Stable/Uncomplicated    Rehab Potential Fair    PT Frequency 2x / week    PT Duration 8 weeks    PT Treatment/Interventions Therapeutic  activities;Therapeutic exercise;Neuromuscular re-education;Patient/family education;Manual techniques;Dry needling;Electrical Stimulation;Iontophoresis 4mg /ml Dexamethasone    PT Next Visit Plan posture, trunk scapular strengthening, manual techniques, modalities PRN    PT Home Exercise Plan Medbridge Access Code [email or web address]    Consulted and Agree with Plan of Care Patient             Patient will benefit from skilled therapeutic intervention in order to improve the following deficits and impairments:  Pain, Improper body mechanics, Postural dysfunction, Decreased strength, Difficulty walking, Decreased endurance, Decreased activity tolerance  Visit Diagnosis: Pain in thoracic spine  Radiculopathy, thoracic region     Problem List Patient Active Problem List   Diagnosis Date Noted   Acute on chronic systolic (congestive) heart failure (HCC) 02/15/2016   Fever 02/04/2016   Normocytic anemia 01/21/2016   Chronic osteomyelitis of right tibia (HCC) 11/19/2015   Pseudomonas aeruginosa infection 11/19/2015   Serratia infection 11/19/2015   Group B streptococcal infection 11/19/2015   Bacteroides fragilis 11/19/2015   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) 11/07/2015   Symptomatic anemia    Wound infection    Congestive heart failure (HCC)    DOE (dyspnea on exertion)    Abnormal finding on thallium stress test    Venous stasis ulcer (HCC) 08/26/2015   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, [place] Phone: [phone]    Fax:  [phone]  Name: Vincent Brown MRN: Gemma Payor Date of Birth: 01-13-1961

## 2021-06-11 ENCOUNTER — Ambulatory Visit: Payer: 59 | Admitting: Family Medicine

## 2021-06-15 ENCOUNTER — Ambulatory Visit: Payer: 59

## 2021-06-15 DIAGNOSIS — M546 Pain in thoracic spine: Secondary | ICD-10-CM

## 2021-06-15 DIAGNOSIS — M5414 Radiculopathy, thoracic region: Secondary | ICD-10-CM | POA: Diagnosis not present

## 2021-06-15 NOTE — Therapy (Signed)
[place] [facility] [address], [place], [address] Phone: [phone]   Fax:  [phone]  Physical Therapy Treatment  Patient Details  Name: Vincent Brown MRN: [id number] Date of Birth: 1961-04-26 Referring Provider (PT): Rodolph Bong, MD   Encounter Date: 06/15/2021   PT End of Session - 06/15/21 1020     Visit Number 5    Number of Visits 17    Date for PT Re-Evaluation 07/30/21    Authorization Type [employer] Primary, [employer] Employee secondary    Authorization Time Period 06/01/21-07/30/21    PT Start Time 1013    PT Stop Time 1053    PT Time Calculation (min) 40 min    Activity Tolerance Patient tolerated treatment well;Patient limited by fatigue;No increased pain    Behavior During Therapy WFL for tasks assessed/performed             Past Medical History:  Diagnosis Date   Anemia    a. 10/2015: symptomatic; requiring transfusion, EGD with antral gastritis. Anemia panel more suggestive of AOCD.   Bacteroides fragilis 11/19/2015   Chronic osteomyelitis of right tibia (HCC) 11/19/2015   Chronic systolic CHF (congestive heart failure) (HCC)    a. unable to tolerate lisinopril/ARB due to dizziness.    CKD (chronic kidney disease), stage II    Coronary artery disease 2012   a. s/p CABG - LIMA - LAD, SVG-OM2, SVG-PDA (11/2010). b. Abnl nuc 09/2015 - R/LHC 10/2015  R/LHC showed patent LIMA-LAD, and SVG-OM2, but occluded SVG-PDA; right heart pressures slightly elevated, otherwise no significant findings.   Diabetes mellitus 2003   Essential hypertension    Gastritis    Group B streptococcal infection 11/19/2015   Headache    Hyperlipidemia    Ischemic cardiomyopathy    Myocardial infarction (HCC)    Proliferative retinopathy 2015   treated with injection of Avastin.    Pseudomonas aeruginosa infection 11/19/2015   Serratia infection 11/19/2015   Shingles 2003   Shortness of breath dyspnea    Venous  insufficiency    a. severe venous insufficiency with previous R leg ulcers requiring vein stripping.    Past Surgical History:  Procedure Laterality Date   APPENDECTOMY  1971   APPLICATION OF A-CELL OF EXTREMITY Right 10/24/2015   Procedure: APPLICATION OF A-CELL AND PLACEMENT OF WOUND VAC;  Surgeon: Alena Bills Dillingham, DO;  [facility];  Service: Plastics;  Laterality: Right;   APPLICATION OF A-CELL OF EXTREMITY Right 01/29/2016   Procedure: APPLICATION OF A-CELL OF EXTREMITY;  Surgeon: Alena Bills Dillingham, DO;  [facility]: [facility];  Service: Plastics;  Laterality: Right;   APPLICATION OF A-CELL OF EXTREMITY Right 04/07/2016   Procedure: APPLICATION OF A-CELL OF EXTREMITY AND VAC DRESSING CHANGE;  Surgeon: Alena Bills Dillingham, DO;  [facility]: [facility];  Service: Plastics;  Laterality: Right;   APPLICATION OF WOUND VAC Right 01/29/2016   Procedure: APPLICATION OF WOUND VAC;  Surgeon: Alena Bills Dillingham, DO;  [facility]: [facility];  Service: Plastics;  Laterality: Right;   CARDIAC CATHETERIZATION N/A 10/22/2015   Procedure: Right/Left Heart Cath and Coronary/Graft Angiography;  Surgeon: Dolores Patty, MD;  Location: [facility] INVASIVE CV LAB;  Service: Cardiovascular;  Laterality: N/A;   COLONOSCOPY  10/2012   Dr Silver Huguenin in [place]. mall sigmoid tics, small internal hemorrhoids.  otherwise normal screening study.  no polyps, telangectasia...   CORONARY ARTERY BYPASS GRAFT  11/05/2010   ESOPHAGOGASTRODUODENOSCOPY (EGD) WITH PROPOFOL N/A 10/21/2015  Procedure: ESOPHAGOGASTRODUODENOSCOPY (EGD) WITH PROPOFOL;  Surgeon: Napoleon Form, MD;  [facility]OS[facility];  Service: Endoscopy;  Laterality: N/A;   FRACTURE SURGERY      wrist   I & D EXTREMITY Right 10/24/2015   Procedure: IRRIGATION AND DEBRIDEMENT EXTREMITY;  Surgeon: Alena Bills Dillingham, DO;  [facility];  Service: Plastics;  Laterality: Right;   I & D EXTREMITY Right 01/29/2016   Procedure: RIGHT LEG WOUND IRRIGATION AND DEBRIDEMENT ;   Surgeon: Alena Bills Dillingham, DO;  [facility];  Service: Plastics;  Laterality: Right;   REFRACTIVE SURGERY  2009, 2015   RIGHT/LEFT HEART CATH AND CORONARY/GRAFT ANGIOGRAPHY N/A 02/27/2021   Procedure: RIGHT/LEFT HEART CATH AND CORONARY/GRAFT ANGIOGRAPHY;  Surgeon: Dolores Patty, MD;  [facility] INV[facility] LAB;  Service: Cardiovascular;  Laterality: N/A;   SKIN SPLIT GRAFT Right 04/07/2016   Procedure: SKIN GRAFT SPLIT THICKNESS TO RIGHT LOWER LEG;  Surgeon: Alena Bills Dillingham, DO;  [facility];  Service: Plastics;  Laterality: Right;   VARICOSE VEIN SURGERY  1995   right lower extremity    There were no vitals filed for this visit.   Subjective Assessment - 06/15/21 1014     Subjective Pt reports an intense session last time, subsequent pain lasting about 2 days, but better toward weekend. HEP is being performed per patient.    Pertinent History Epigastric and abdominal pain. Pain is located predominanly R 10th rib area, also feels pain L 10th rib area as well. Pain began around January/February 2022. Gradual onset. 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 No findings with GI or cardiaology appointment. Denies loss of bowel or bladder control or LE paresthesia. Pt is R hand dominant. Pain has stayed the same since onset.    Patient Stated Goals Decrease pain.    Currently in Pain? Yes    Pain Score 3              INTERVENTION THIS DATE:  -seated chair rotation stretch 3x30sec bilat (aggravation of Rt anterior Rib 10 symptoms with Left rotation only)  -BUE wand flexion in hooklying 1x20 (shoulder flexion limited bilat)  -Hooklying Marching 1x20 (alternate pattern) -Hooklying Bridge 1x20  -BUE wand flexion in hooklying 1x20 (shoulder flexion limited bilat)  -Hooklying Marching 1x20 (alternate pattern) -Hooklying Bridge 1x20   -standing blueTB row  1x20 -standing blueTB shoulder extension 1x20  -standing blueTB row 1x20 -standing blueTB shoulder extension 1x20  -20lb kettlebell RDL to stepstool x12, extensive verbal, tactile, visual cues for form, pt unable to abate his thoracic spine dominant movement pattern *no pain during exercise.    PT Short Term Goals - 06/01/21 1113       PT SHORT TERM GOAL #1   Title Pt will be independent with his initial HEP to decrease pain, improve ability to ambulate uphill more comfortably.    Baseline Pt has started his HEP (06/01/2021)    Time 3    Period Weeks    Status New    Target Date 06/25/21               PT Long Term Goals - 06/01/21 1114       PT LONG TERM GOAL #1   Title Pt will have a decrease in thoracic pain to 3/10 or less at worst to promote ability to breath as  well as ambulate uphill or sit on his truck seat more comfortably.    Baseline 6/10 thoracic pain at worst for the past 3 months (06/01/2021)    Time 8    Period Weeks    Status New    Target Date 07/30/21      PT LONG TERM GOAL #2   Title Pt will improve bilateral scapular strength by at least 1/2 MMT grade to promote ability to breathe, as well as ambulate uphill more comfortably.    Baseline Manually resisted scapular retraction targeting the lower trap muscles 4-/5 R and L (06/01/2021)    Time 8    Period Weeks    Status New    Target Date 07/30/21      PT LONG TERM GOAL #3   Title Pt will improve his FOTO score by at least 10 points as a demonstration of improved function.    Baseline Rib FOTO 54 (06/01/2021)    Time 8    Period Weeks    Status New    Target Date 07/30/21                   Plan - 06/15/21 1022     Clinical Impression Statement Continued to address thoracic deficits and impairment, working toward improvement in ROM and strength. Pt tolerates session well, only some increased pain with thoracic rotation stretch to Left. Pt demonstrates good power and strength overall. Given pt's  cachectic appearance, Thereasa Parkin inquires about recent unintended weight loss which pt confirms, reports ongoing as for >1 year, shows where he has adjusted his leather belt over time, but unable to remark on exact numbers. Unclear if this has been previously established, but will make note of this and review chart in more depth.    Personal Factors and Comorbidities Comorbidity 3+;Age;Fitness;Past/Current Experience;Time since onset of injury/illness/exacerbation    Comorbidities Anemia, CHF, CKD, CAD, DM, ischemic cardiomyopathy, dyspnea, venous insufficiency    Examination-Activity Limitations Locomotion Level;Sit    Stability/Clinical Decision Making Stable/Uncomplicated    Clinical Decision Making Low    Rehab Potential Fair    PT Frequency 2x / week    PT Duration 8 weeks    PT Treatment/Interventions Therapeutic activities;Therapeutic exercise;Neuromuscular re-education;Patient/family education;Manual techniques;Dry needling;Electrical Stimulation;Iontophoresis 4mg /ml Dexamethasone    PT Next Visit Plan posture, trunk scapular strengthening, manual techniques, modalities PRN    PT Home Exercise Plan Medbridge Access Code [email or web address]    Consulted and Agree with Plan of Care Patient             Patient will benefit from skilled therapeutic intervention in order to improve the following deficits and impairments:  Pain, Improper body mechanics, Postural dysfunction, Decreased strength, Difficulty walking, Decreased endurance, Decreased activity tolerance  Visit Diagnosis: Pain in thoracic spine  Radiculopathy, thoracic region     Problem List Patient Active Problem List   Diagnosis Date Noted   Acute on chronic systolic (congestive) heart failure (HCC) 02/15/2016   Fever 02/04/2016   Normocytic anemia 01/21/2016   Chronic osteomyelitis of right tibia (HCC) 11/19/2015   Pseudomonas aeruginosa infection 11/19/2015   Serratia infection 11/19/2015   Group B streptococcal infection  11/19/2015   Bacteroides fragilis 11/19/2015   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) 11/07/2015   Symptomatic anemia    Wound infection    Congestive heart failure (HCC)    DOE (dyspnea on exertion)    Abnormal finding on thallium stress test  Venous stasis ulcer (HCC) 08/26/2015   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, [place], [address] Phone: [phone]   Fax:  [phone]  Name: Vincent Brown MRN: [id number] Date of Birth: May 25, 1961

## 2021-06-17 ENCOUNTER — Ambulatory Visit: Payer: 59

## 2021-06-17 DIAGNOSIS — M5414 Radiculopathy, thoracic region: Secondary | ICD-10-CM

## 2021-06-17 DIAGNOSIS — M546 Pain in thoracic spine: Secondary | ICD-10-CM | POA: Diagnosis not present

## 2021-06-17 NOTE — Therapy (Signed)
[place] [facility] [address] [place], [address] Phone: [phone]   Fax:  [phone]  Physical Therapy Treatment  Patient Details  Name: Vincent Brown MRN: [id number] Date of Birth: 04/30/1961 Referring Provider (PT): Rodolph Bong, MD   Encounter Date: 06/17/2021   PT End of Session - 06/17/21 1013     Visit Number 6    Number of Visits 17    Date for PT Re-Evaluation 07/30/21    Authorization Type [employer] Primary, [employer] Employee secondary    Authorization Time Period 06/01/21-07/30/21    PT Start Time 1013    PT Stop Time 1053    PT Time Calculation (min) 40 min    Activity Tolerance Patient tolerated treatment well;Patient limited by fatigue;No increased pain    Behavior During Therapy WFL for tasks assessed/performed             Past Medical History:  Diagnosis Date   Anemia    a. 10/2015: symptomatic; requiring transfusion, EGD with antral gastritis. Anemia panel more suggestive of AOCD.   Bacteroides fragilis 11/19/2015   Chronic osteomyelitis of right tibia (HCC) 11/19/2015   Chronic systolic CHF (congestive heart failure) (HCC)    a. unable to tolerate lisinopril/ARB due to dizziness.    CKD (chronic kidney disease), stage II    Coronary artery disease 2012   a. s/p CABG - LIMA - LAD, SVG-OM2, SVG-PDA (11/2010). b. Abnl nuc 09/2015 - R/LHC 10/2015  R/LHC showed patent LIMA-LAD, and SVG-OM2, but occluded SVG-PDA; right heart pressures slightly elevated, otherwise no significant findings.   Diabetes mellitus 2003   Essential hypertension    Gastritis    Group B streptococcal infection 11/19/2015   Headache    Hyperlipidemia    Ischemic cardiomyopathy    Myocardial infarction (HCC)    Proliferative retinopathy 2015   treated with injection of Avastin.    Pseudomonas aeruginosa infection 11/19/2015   Serratia infection 11/19/2015   Shingles 2003   Shortness of breath dyspnea    Venous  insufficiency    a. severe venous insufficiency with previous R leg ulcers requiring vein stripping.    Past Surgical History:  Procedure Laterality Date   APPENDECTOMY  1971   APPLICATION OF A-CELL OF EXTREMITY Right 10/24/2015   Procedure: APPLICATION OF A-CELL AND PLACEMENT OF WOUND VAC;  Surgeon: Alena Bills Dillingham, DO;  [facility]: [facility];  Service: Plastics;  Laterality: Right;   APPLICATION OF A-CELL OF EXTREMITY Right 01/29/2016   Procedure: APPLICATION OF A-CELL OF EXTREMITY;  Surgeon: Alena Bills Dillingham, DO;  [facility]: [facility];  Service: Plastics;  Laterality: Right;   APPLICATION OF A-CELL OF EXTREMITY Right 04/07/2016   Procedure: APPLICATION OF A-CELL OF EXTREMITY AND VAC DRESSING CHANGE;  Surgeon: Alena Bills Dillingham, DO;  [facility]: [facility];  Service: Plastics;  Laterality: Right;   APPLICATION OF WOUND VAC Right 01/29/2016   Procedure: APPLICATION OF WOUND VAC;  Surgeon: Alena Bills Dillingham, DO;  [facility]: [facility];  Service: Plastics;  Laterality: Right;   CARDIAC CATHETERIZATION N/A 10/22/2015   Procedure: Right/Left Heart Cath and Coronary/Graft Angiography;  Surgeon: Dolores Patty, MD;  Location: [facility] INVASIVE CV LAB;  Service: Cardiovascular;  Laterality: N/A;   COLONOSCOPY  10/2012   Dr Silver Huguenin in [place]. mall sigmoid tics, small internal hemorrhoids.  otherwise normal screening study.  no polyps, telangectasia...   CORONARY ARTERY BYPASS GRAFT  11/05/2010   ESOPHAGOGASTRODUODENOSCOPY (EGD) WITH PROPOFOL N/A 10/21/2015  Procedure: ESOPHAGOGASTRODUODENOSCOPY (EGD) WITH PROPOFOL;  Surgeon: Napoleon Form, MD;  [facility];  Service: Endoscopy;  Laterality: N/A;   FRACTURE SURGERY      wrist   I & D EXTREMITY Right 10/24/2015   Procedure: IRRIGATION AND DEBRIDEMENT EXTREMITY;  Surgeon: Alena Bills Dillingham, DO;  [facility];  Service: Plastics;  Laterality: Right;   I & D EXTREMITY Right 01/29/2016   Procedure: RIGHT LEG WOUND IRRIGATION AND DEBRIDEMENT ;   Surgeon: Alena Bills Dillingham, DO;  [facility];  Service: Plastics;  Laterality: Right;   REFRACTIVE SURGERY  2009, 2015   RIGHT/LEFT HEART CATH AND CORONARY/GRAFT ANGIOGRAPHY N/A 02/27/2021   Procedure: RIGHT/LEFT HEART CATH AND CORONARY/GRAFT ANGIOGRAPHY;  Surgeon: Dolores Patty, MD;  [facility] LAB;  Service: Cardiovascular;  Laterality: N/A;   SKIN SPLIT GRAFT Right 04/07/2016   Procedure: SKIN GRAFT SPLIT THICKNESS TO RIGHT LOWER LEG;  Surgeon: Alena Bills Dillingham, DO;  [facility];  Service: Plastics;  Laterality: Right;   VARICOSE VEIN SURGERY  1995   right lower extremity    There were no vitals filed for this visit.   Subjective Assessment - 06/17/21 1014     Subjective R rib is a little bit sore, 3/10 currently. 3/10 L rib soreness as well. Was pretty sore after last session (muscles). Ribs were about the same after last session. 3-4/10 R rib pain at most for the past 7 days.    Pertinent History Epigastric and abdominal pain. Pain is located predominanly R 10th rib area, also feels pain L 10th rib area as well. Pain began around January/February 2022. Gradual onset. 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 No findings with GI or cardiaology appointment. Denies loss of bowel or bladder control or LE paresthesia. Pt is R hand dominant. Pain has stayed the same since onset.    Patient Stated Goals Decrease pain.    Currently in Pain? Yes    Pain Score 3     Pain Location Rib cage    Pain Orientation Right    Pain Descriptors / Indicators Sore                                       PT Education - 06/17/21 1026     Education Details ther-ex    Person(s) Educated Patient    Methods Explanation;Demonstration;Tactile cues;Verbal cues    Comprehension Returned demonstration;Verbalized understanding             Objective   No latex allergies        Reproduction of symptoms with sitting upright and B shoulder flexion L > R   Medbridge Access Code [email or web address]      Therapeutic exercise   Seated manually resitsed trunk rotation isometrics in neutral   L 10x5 seconds   R 10x5 seconds x 3. More neutral trunk observed  No resting soreness after exercise  Seated hip extension isometrics   R 10x5 seconds for 2 sets  L 10x5 seconds for 2 sets  B quadratus lumborum muscle activation palpated during exercise  Seated B scapular retraction 10x5 seconds for 3 sets  Seated thoracic extension isometrics with gentle manual resistance 10x5 seconds in neutral for 3 sets   Standing pallof  press yellow band   R 10x5 seconds for 2 sets  L 10x5 seconds for 2 sets     Therapeutic rest breaks provided secondary to fatigue   Improved exercise technique, movement at target joints, use of target muscles after mod verbal, visual, tactile cues.         Response to treatment Pt tolerated session well without aggravation of symptoms.    Clinical impression  Decreased B 12th rib soreness with treatment to decrease lumbar rotation posture to promote a more neutral position. Decreased overall R rib pain level at worst for the past 7 days based on subjective reports. Pt tolerated session well without aggravation of symptoms. Pt will benefit from skilled physical therapy services to address the aforementioned deficits.       PT Short Term Goals - 06/01/21 1113       PT SHORT TERM GOAL #1   Title Pt will be independent with his initial HEP to decrease pain, improve ability to ambulate uphill more comfortably.    Baseline Pt has started his HEP (06/01/2021)    Time 3    Period Weeks    Status New    Target Date 06/25/21               PT Long Term Goals - 06/01/21 1114       PT LONG TERM GOAL #1   Title Pt will have a decrease in thoracic pain to 3/10 or less at worst to promote ability to  breath as well as ambulate uphill or sit on his truck seat more comfortably.    Baseline 6/10 thoracic pain at worst for the past 3 months (06/01/2021)    Time 8    Period Weeks    Status New    Target Date 07/30/21      PT LONG TERM GOAL #2   Title Pt will improve bilateral scapular strength by at least 1/2 MMT grade to promote ability to breathe, as well as ambulate uphill more comfortably.    Baseline Manually resisted scapular retraction targeting the lower trap muscles 4-/5 R and L (06/01/2021)    Time 8    Period Weeks    Status New    Target Date 07/30/21      PT LONG TERM GOAL #3   Title Pt will improve his FOTO score by at least 10 points as a demonstration of improved function.    Baseline Rib FOTO 54 (06/01/2021)    Time 8    Period Weeks    Status New    Target Date 07/30/21                   Plan - 06/17/21 1010     Clinical Impression Statement Decreased B 12th rib soreness with treatment to decrease lumbar rotation posture to promote a more neutral position. Decreased overall R rib pain level at worst for the past 7 days based on subjective reports. Pt tolerated session well without aggravation of symptoms. Pt will benefit from skilled physical therapy services to address the aforementioned deficits.    Personal Factors and Comorbidities Comorbidity 3+;Age;Fitness;Past/Current Experience;Time since onset of injury/illness/exacerbation    Comorbidities Anemia, CHF, CKD, CAD, DM, ischemic cardiomyopathy, dyspnea, venous insufficiency    Examination-Activity Limitations Locomotion Level;Sit    Stability/Clinical Decision Making Stable/Uncomplicated    Rehab Potential Fair    PT Frequency 2x / week    PT Duration 8 weeks    PT Treatment/Interventions Therapeutic activities;Therapeutic exercise;Neuromuscular  re-education;Patient/family education;Manual techniques;Dry needling;Electrical Stimulation;Iontophoresis 4mg /ml Dexamethasone    PT Next Visit Plan posture,  trunk scapular strengthening, manual techniques, modalities PRN    PT Home Exercise Plan Medbridge Access Code [email or web address]    Consulted and Agree with Plan of Care Patient             Patient will benefit from skilled therapeutic intervention in order to improve the following deficits and impairments:  Pain, Improper body mechanics, Postural dysfunction, Decreased strength, Difficulty walking, Decreased endurance, Decreased activity tolerance  Visit Diagnosis: Pain in thoracic spine  Radiculopathy, thoracic region     Problem List Patient Active Problem List   Diagnosis Date Noted   Acute on chronic systolic (congestive) heart failure (HCC) 02/15/2016   Fever 02/04/2016   Normocytic anemia 01/21/2016   Chronic osteomyelitis of right tibia (HCC) 11/19/2015   Pseudomonas aeruginosa infection 11/19/2015   Serratia infection 11/19/2015   Group B streptococcal infection 11/19/2015   Bacteroides fragilis 11/19/2015   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) 11/07/2015   Symptomatic anemia    Wound infection    Congestive heart failure (HCC)    DOE (dyspnea on exertion)    Abnormal finding on thallium stress test    Venous stasis ulcer (HCC) 08/26/2015   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, [address], [place] Phone: [phone]   Fax:  [phone]  Name: Vincent Brown MRN: Gemma Payor Date of Birth: [birth year]

## 2021-06-22 ENCOUNTER — Encounter (HOSPITAL_COMMUNITY): Payer: Self-pay | Admitting: Internal Medicine

## 2021-06-22 ENCOUNTER — Other Ambulatory Visit: Payer: Self-pay

## 2021-06-22 ENCOUNTER — Ambulatory Visit (HOSPITAL_COMMUNITY)
Admission: RE | Admit: 2021-06-22 | Discharge: 2021-06-22 | Disposition: A | Discharge status: Home / Self Care | Payer: 59 | Source: Ambulatory Visit | Attending: Internal Medicine | Admitting: Internal Medicine

## 2021-06-22 ENCOUNTER — Ambulatory Visit: Payer: 59

## 2021-06-22 VITALS — BP 90/50 | HR 80 | Wt 124.4 lb

## 2021-06-22 DIAGNOSIS — E1122 Type 2 diabetes mellitus with diabetic chronic kidney disease: Secondary | ICD-10-CM | POA: Diagnosis not present

## 2021-06-22 DIAGNOSIS — Z7984 Long term (current) use of oral hypoglycemic drugs: Secondary | ICD-10-CM | POA: Diagnosis not present

## 2021-06-22 DIAGNOSIS — Z8249 Family history of ischemic heart disease and other diseases of the circulatory system: Secondary | ICD-10-CM | POA: Insufficient documentation

## 2021-06-22 DIAGNOSIS — I2510 Atherosclerotic heart disease of native coronary artery without angina pectoris: Secondary | ICD-10-CM | POA: Diagnosis not present

## 2021-06-22 DIAGNOSIS — N182 Chronic kidney disease, stage 2 (mild): Secondary | ICD-10-CM | POA: Insufficient documentation

## 2021-06-22 DIAGNOSIS — Z951 Presence of aortocoronary bypass graft: Secondary | ICD-10-CM | POA: Insufficient documentation

## 2021-06-22 DIAGNOSIS — M546 Pain in thoracic spine: Secondary | ICD-10-CM

## 2021-06-22 DIAGNOSIS — I872 Venous insufficiency (chronic) (peripheral): Secondary | ICD-10-CM | POA: Insufficient documentation

## 2021-06-22 DIAGNOSIS — I5022 Chronic systolic (congestive) heart failure: Secondary | ICD-10-CM | POA: Insufficient documentation

## 2021-06-22 DIAGNOSIS — Z9049 Acquired absence of other specified parts of digestive tract: Secondary | ICD-10-CM | POA: Insufficient documentation

## 2021-06-22 DIAGNOSIS — M5414 Radiculopathy, thoracic region: Secondary | ICD-10-CM | POA: Diagnosis not present

## 2021-06-22 DIAGNOSIS — Z79899 Other long term (current) drug therapy: Secondary | ICD-10-CM | POA: Diagnosis not present

## 2021-06-22 DIAGNOSIS — I130 Hypertensive heart and chronic kidney disease with heart failure and stage 1 through stage 4 chronic kidney disease, or unspecified chronic kidney disease: Secondary | ICD-10-CM | POA: Diagnosis not present

## 2021-06-22 DIAGNOSIS — I25118 Atherosclerotic heart disease of native coronary artery with other forms of angina pectoris: Secondary | ICD-10-CM | POA: Diagnosis not present

## 2021-06-22 DIAGNOSIS — E118 Type 2 diabetes mellitus with unspecified complications: Secondary | ICD-10-CM | POA: Diagnosis not present

## 2021-06-22 MED ORDER — NITROGLYCERIN 0.2 MG/HR TD PT24
0.2000 mg | MEDICATED_PATCH | Freq: Every day | TRANSDERMAL | 11 refills | Status: DC
  Filled 2021-06-22: qty 30, 30d supply, fill #0

## 2021-06-22 NOTE — Progress Notes (Signed)
Patient ID: Vincent Brown, male   DOB: 1961-09-17, 60 y.o.   MRN: [id number]    [facility]  Date:  06/22/2021  Patient ID:  Vincent Brown, Vincent Brown 06-May-1961, MRN [id number] PCP:  Dorothey Baseman, MD  Cardiologist:  DBensimhon (CHF clinic)    Subjective: Vincent Brown is a 60 y.o. 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  Had a NSTEMI in 1/12 after presenting with bad cough. Had cath at [facility] LAD 100% LCX 70% RCA p70%, d100%. EF 25%. Cardiac MRI: EF 15% anterior wall and inferior wall infarct with significant viability -> CABG  Unable to tolerate lisinopril or losartan due to dizziness. Spironolactone stopped due to hyperkalemia. Unable to tolerate Jardiance so stopped. Refuses ICD.   In 2017 had RLE chronic wound and MRI was suggestive of osteomyelitis. Vascular was consulted and felt AKA was the best option but the patient was not willing to have this at this time. Plastic surgery was consulted and performed debridement/wound vac. Healed completely.   Echo 02/16/21: EF 15% RV mild to moderately down   Cath 4/22  Ao = 113/60 (81) LV = 115/20 RA = 3 RV = 36/1 PA = 40/15 (25) PCW = N/A Fick cardiac output/index = 5.9/3.3 PVR =  0.9 WU SVR = 1,064 ao sat = 99% PA sat = 74%, 73% High SVC = 73%   Assessment: 1. Stable CAD with patent grafts without change from prior 2. Severe iCM EF 15-20% 3. Mildly elevated filling pressures with normal cardiac output   Today he returns for HF follow up with his wife. At last visit I was worried about progressive HF symptoms. NYHA IIIB. He underwent cath as above. Medical therapy continues. Says he is doing ok. Continues to struggle with RUQ pain. Which comes and goes and seems positional.Has been seen by GI and sports medicine. Now undergoing  PT. Otherwise says he is doing ok. 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 Denies CP, edema, orthopnea or PND.     ECHO 12/2011: EF 25-30% ECHO 03/14/13 EF 25% ECHO 12/15: EF 25%, diffuse hypokinesis, normal RV size, mildly decreased RV systolic function, PA systolic pressure 40 mmHg    Past Medical History:  Diagnosis Date   Anemia    a. 10/2015: symptomatic; requiring transfusion, EGD with antral gastritis. Anemia panel more suggestive of AOCD.   Bacteroides fragilis 11/19/2015   Chronic osteomyelitis of right tibia (HCC) 11/19/2015   Chronic systolic CHF (congestive heart failure) (HCC)    a. unable to tolerate lisinopril/ARB due to dizziness.    CKD (chronic kidney disease), stage II    Coronary artery disease 2012   a. s/p CABG - LIMA - LAD, SVG-OM2, SVG-PDA (11/2010). b. Abnl nuc 09/2015 - R/LHC 10/2015  R/LHC showed patent LIMA-LAD, and SVG-OM2, but occluded SVG-PDA; right heart pressures slightly elevated, otherwise no significant findings.   Diabetes mellitus 2003   Essential hypertension    Gastritis    Group B streptococcal infection 11/19/2015   Headache    Hyperlipidemia    Ischemic cardiomyopathy    Myocardial infarction (HCC)    Proliferative retinopathy 2015   treated with injection of Avastin.    Pseudomonas aeruginosa infection 11/19/2015   Serratia infection 11/19/2015   Shingles 2003   Shortness  of breath dyspnea    Venous insufficiency    a. severe venous insufficiency with previous R leg ulcers requiring vein stripping.    Past Surgical History:  Procedure Laterality Date   APPENDECTOMY  1971   APPLICATION OF A-CELL OF EXTREMITY Right 10/24/2015   Procedure: APPLICATION OF A-CELL AND PLACEMENT OF WOUND VAC;  Surgeon: Alena Bills Dillingham, DO;  [facility];  Service: Plastics;  Laterality: Right;   APPLICATION OF A-CELL OF EXTREMITY Right 01/29/2016   Procedure: APPLICATION OF A-CELL OF EXTREMITY;  Surgeon: Alena Bills Dillingham,  DO;  [facility];  Service: Plastics;  Laterality: Right;   APPLICATION OF A-CELL OF EXTREMITY Right 04/07/2016   Procedure: APPLICATION OF A-CELL OF EXTREMITY AND VAC DRESSING CHANGE;  Surgeon: Alena Bills Dillingham, DO;  [facility];  Service: Plastics;  Laterality: Right;   APPLICATION OF WOUND VAC Right 01/29/2016   Procedure: APPLICATION OF WOUND VAC;  Surgeon: Alena Bills Dillingham, DO;  [facility];  Service: Plastics;  Laterality: Right;   CARDIAC CATHETERIZATION N/A 10/22/2015   Procedure: Right/Left Heart Cath and Coronary/Graft Angiography;  Surgeon: Dolores Patty, MD;  Location: [facility] INVASIVE CV LAB;  Service: Cardiovascular;  Laterality: N/A;   COLONOSCOPY  10/2012   Dr Silver Huguenin in [place]. mall sigmoid tics, small internal hemorrhoids.  otherwise normal screening study.  no polyps, telangectasia...   CORONARY ARTERY BYPASS GRAFT  11/05/2010   ESOPHAGOGASTRODUODENOSCOPY (EGD) WITH PROPOFOL N/A 10/21/2015   Procedure: ESOPHAGOGASTRODUODENOSCOPY (EGD) WITH PROPOFOL;  Surgeon: Napoleon Form, MD;  [facility];  Service: Endoscopy;  Laterality: N/A;   FRACTURE SURGERY      wrist   I & D EXTREMITY Right 10/24/2015   Procedure: IRRIGATION AND DEBRIDEMENT EXTREMITY;  Surgeon: Alena Bills Dillingham, DO;  [facility];  Service: Plastics;  Laterality: Right;   I & D EXTREMITY Right 01/29/2016   Procedure: RIGHT LEG WOUND IRRIGATION AND DEBRIDEMENT ;  Surgeon: Alena Bills Dillingham, DO;  [facility];  Service: Plastics;  Laterality: Right;   REFRACTIVE SURGERY  2009, 2015   RIGHT/LEFT HEART CATH AND CORONARY/GRAFT ANGIOGRAPHY N/A 02/27/2021   Procedure: RIGHT/LEFT HEART CATH AND CORONARY/GRAFT ANGIOGRAPHY;  Surgeon: Dolores Patty, MD;  [facility] IN[facility] LAB;  Service: Cardiovascular;  Laterality: N/A;   SKIN SPLIT GRAFT Right 04/07/2016   Procedure: SKIN GRAFT SPLIT THICKNESS TO RIGHT LOWER LEG;  Surgeon: Alena Bills Dillingham, DO;  [facility];   Service: Plastics;  Laterality: Right;   VARICOSE VEIN SURGERY  1995   right lower extremity    Current Outpatient Medications  Medication Sig Dispense Refill   Acetaminophen 500 MG coapsule Take 500 mg by mouth every 4 (four) hours as needed for pain.     atorvastatin (LIPITOR) 20 MG tablet TAKE 1 TABLET BY MOUTH ONCE DAILY 90 tablet 1   cyclobenzaprine (FLEXERIL) 10 MG tablet Take 10 mg by mouth at bedtime as needed for muscle spasms.      digoxin (LANOXIN) 0.125 MG tablet TAKE 1 TABLET (0.125 MG TOTAL) BY MOUTH DAILY. 30 tablet 11   famotidine (PEPCID) 20 MG tablet Take 1 tablet (20 mg total) by mouth at bedtime as needed for heartburn or indigestion. 30 tablet 0   furosemide (LASIX) 80 MG tablet Take 80 mg by mouth daily as needed for fluid or edema.     glimepiride (AMARYL) 4 MG tablet Take 1 tablet (4 mg total) by mouth 2 (two) times daily 180 tablet 1  metoprolol succinate (TOPROL-XL) 50 MG 24 hr tablet take 1 1/2 tablet by mouth once daily 45 tablet 6   pantoprazole (PROTONIX) 40 MG tablet Take 1 tablet (40 mg total) by mouth 2 (two) times daily. Take one tablet 30-60 minutes before breakfast and dinner. 60 tablet 5   No current facility-administered medications for this encounter.    Allergies:   Patient has no known allergies.   Social History:  The patient  reports that he has never smoked. He has never used smokeless tobacco. He reports that he does not drink alcohol and does not use drugs.   Family History:  The patient's family history includes Hypertension in his father; Other in his father; Parkinson's disease in his father.  ROS:  Please see the history of present illness.    All other systems are reviewed and otherwise negative.   Wt Readings from Last 3 Encounters:  06/22/21 56.4 kg (124 lb 6.4 oz)  04/27/21 56.3 kg (124 lb 3.2 oz)  04/14/21 55.3 kg (122 lb)   Vitals:   06/22/21 1341  BP: (!) 90/50  Pulse: 80  SpO2: 96%  Weight: 56.4 kg (124 lb 6.4 oz)      PHYSICAL EXAM:  General:  Thin male. Weak appearing No resp difficulty HEENT: normal Neck: supple. no JVD. Carotids 2+ bilat; no bruits. No lymphadenopathy or thryomegaly appreciated. Cor: PMI nondisplaced. Regular rate & rhythm. No rubs, gallops or murmurs. Lungs: clear Abdomen: soft, nontender, nondistended. No hepatosplenomegaly. No bruits or masses. Good bowel sounds. Extremities: no cyanosis, clubbing, rash, edema + healed wound Neuro: alert & orientedx3, cranial nerves grossly intact. moves all 4 extremities w/o difficulty. Affect pleasant   Recent Labs: 04/14/2021: ALT 9; BUN 27; Creatinine, Ser 0.96; Hemoglobin 11.8; Platelets 155.0; Potassium 4.6; Sodium 137  No results found for requested labs within last 8760 hours.   CrCl cannot be calculated (Patient's most recent lab result is older than the maximum 21 days allowed.).   Wt Readings from Last 3 Encounters:  06/22/21 56.4 kg (124 lb 6.4 oz)  04/27/21 56.3 kg (124 lb 3.2 oz)  04/14/21 55.3 kg (122 lb)     Other studies reviewed: Additional studies/records reviewed today include: summarized above  ASSESSMENT AND PLAN:  1. Chronic systolic CHF/ICM  -iCM EF 15-20% (echo 12/16). - Echo 6/20 EF 20-25% mild RV HK - Echo 4/22 LVEF 15% RV mild to moderately down - R/L cath as 5/22 as above. Stable CAD with patent grafts. CI 3.3 - Stable NYHA III but BP quite low.  - Continue toprol 75 daily, digoxin 0.125  - Intolerant to losartan and lisinopril due to hypotension.  - Intolerant spironolactone due to hyperkalemia  - We started Jardiance previously but he said sugar went up so PCP changed back. I tried to explain that CV benefit indepenedent of glycemic control but he refused. Remains unnwilling to reconsider.  - Continues to refuse ICD - I remained concerned that he is nearing end-stage. I am also worried that his rib/chest pain is an anginal equivalent though no clear targets on cath. Will try low-dose NTG patches to  see if these help (as BP tolerates)  - Not candidate for VAD given cachexia and other issues. - Can consider midodrine for BP support as needed.   2. CAD s/p CABG as above - Cath 5/22 stable. Severe 3v CAD as above with patent LIMA to LAD and patent SVG to OM  - SVG to RPDA is occluded but there are good  L->R and R->R collaterals - As above try NTG patch - Continue ASA/statin. Refuses Jardiance  3. DMII - followed by PCP. Refuses SGLT2i as above - HgBa1c well controlled per his report   Arvilla Meres MD 2:08 PM

## 2021-06-22 NOTE — Therapy (Signed)
[facility]ICINE [address] [place], [address] Phone: [phone]   Fax:  [phone]  Physical Therapy Treatment  Patient Details  Name: Vincent Brown MRN: [id number] Date of Birth: 1960-12-16 Referring Provider (PT): Rodolph Bong, MD   Encounter Date: 06/22/2021   PT End of Session - 06/22/21 1014     Visit Number 7    Number of Visits 17    Date for PT Re-Evaluation 07/30/21    Authorization Type [employer] Primary, [employer] Employee secondary    Authorization Time Period 06/01/21-07/30/21    PT Start Time 1012    PT Stop Time 1056    PT Time Calculation (min) 44 min    Activity Tolerance Patient tolerated treatment well;Patient limited by fatigue;No increased pain    Behavior During Therapy WFL for tasks assessed/performed             Past Medical History:  Diagnosis Date   Anemia    a. 10/2015: symptomatic; requiring transfusion, EGD with antral gastritis. Anemia panel more suggestive of AOCD.   Bacteroides fragilis 11/19/2015   Chronic osteomyelitis of right tibia (HCC) 11/19/2015   Chronic systolic CHF (congestive heart failure) (HCC)    a. unable to tolerate lisinopril/ARB due to dizziness.    CKD (chronic kidney disease), stage II    Coronary artery disease 2012   a. s/p CABG - LIMA - LAD, SVG-OM2, SVG-PDA (11/2010). b. Abnl nuc 09/2015 - R/LHC 10/2015  R/LHC showed patent LIMA-LAD, and SVG-OM2, but occluded SVG-PDA; right heart pressures slightly elevated, otherwise no significant findings.   Diabetes mellitus 2003   Essential hypertension    Gastritis    Group B streptococcal infection 11/19/2015   Headache    Hyperlipidemia    Ischemic cardiomyopathy    Myocardial infarction (HCC)    Proliferative retinopathy 2015   treated with injection of Avastin.    Pseudomonas aeruginosa infection 11/19/2015   Serratia infection 11/19/2015   Shingles 2003   Shortness of breath dyspnea    Venous  insufficiency    a. severe venous insufficiency with previous R leg ulcers requiring vein stripping.    Past Surgical History:  Procedure Laterality Date   APPENDECTOMY  1971   APPLICATION OF A-CELL OF EXTREMITY Right 10/24/2015   Procedure: APPLICATION OF A-CELL AND PLACEMENT OF WOUND VAC;  Surgeon: Alena Bills Dillingham, DO;  [facility];  Service: Plastics;  Laterality: Right;   APPLICATION OF A-CELL OF EXTREMITY Right 01/29/2016   Procedure: APPLICATION OF A-CELL OF EXTREMITY;  Surgeon: Alena Bills Dillingham, DO;  [facility]: [facility];  Service: Plastics;  Laterality: Right;   APPLICATION OF A-CELL OF EXTREMITY Right 04/07/2016   Procedure: APPLICATION OF A-CELL OF EXTREMITY AND VAC DRESSING CHANGE;  Surgeon: Alena Bills Dillingham, DO;  [facility];  Service: Plastics;  Laterality: Right;   APPLICATION OF WOUND VAC Right 01/29/2016   Procedure: APPLICATION OF WOUND VAC;  Surgeon: Alena Bills Dillingham, DO;  [facility];  Service: Plastics;  Laterality: Right;   CARDIAC CATHETERIZATION N/A 10/22/2015   Procedure: Right/Left Heart Cath and Coronary/Graft Angiography;  Surgeon: Dolores Patty, MD;  Location: [facility] INVASIVE CV LAB;  Service: Cardiovascular;  Laterality: N/A;   COLONOSCOPY  10/2012   Dr Silver Huguenin in [place]. mall sigmoid tics, small internal hemorrhoids.  otherwise normal screening study.  no polyps, telangectasia...   CORONARY ARTERY BYPASS GRAFT  11/05/2010   ESOPHAGOGASTRODUODENOSCOPY (EGD) WITH PROPOFOL N/A 10/21/2015  Procedure: ESOPHAGOGASTRODUODENOSCOPY (EGD) WITH PROPOFOL;  Surgeon: Napoleon Form, MD;  [facility];  Service: Endoscopy;  Laterality: N/A;   FRACTURE SURGERY      wrist   I & D EXTREMITY Right 10/24/2015   Procedure: IRRIGATION AND DEBRIDEMENT EXTREMITY;  Surgeon: Alena Bills Dillingham, DO;  [facility];  Service: Plastics;  Laterality: Right;   I & D EXTREMITY Right 01/29/2016   Procedure: RIGHT LEG WOUND IRRIGATION AND DEBRIDEMENT ;   Surgeon: Alena Bills Dillingham, DO;  [facility];  Service: Plastics;  Laterality: Right;   REFRACTIVE SURGERY  2009, 2015   RIGHT/LEFT HEART CATH AND CORONARY/GRAFT ANGIOGRAPHY N/A 02/27/2021   Procedure: RIGHT/LEFT HEART CATH AND CORONARY/GRAFT ANGIOGRAPHY;  Surgeon: Dolores Patty, MD;  [facility];  Service: Cardiovascular;  Laterality: N/A;   SKIN SPLIT GRAFT Right 04/07/2016   Procedure: SKIN GRAFT SPLIT THICKNESS TO RIGHT LOWER LEG;  Surgeon: Alena Bills Dillingham, DO;  [facility];  Service: Plastics;  Laterality: Right;   VARICOSE VEIN SURGERY  1995   right lower extremity    There were no vitals filed for this visit.   Subjective Assessment - 06/22/21 1012     Subjective Pt denies soreness in rib cage. Minor soreness since last visit. 4/10 pain NPS in ribs over weekend with no particular activity.    Pertinent History Epigastric and abdominal pain. Pain is located predominanly R 10th rib area, also feels pain L 10th rib area as well. Pain began around January/February 2022. Gradual onset. 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 No findings with GI or cardiaology appointment. Denies loss of bowel or bladder control or LE paresthesia. Pt is R hand dominant. Pain has stayed the same since onset.    Patient Stated Goals Decrease pain.    Currently in Pain? No/denies    Pain Score 0-No pain             There.ex:  Seated manually resitsed trunk rotation isometrics in neutral              L 10x5 seconds              R 10x5 seconds x 2   Scap retractions: 3x10 with blue TB  Hook lying:   Glut bridges for post hip activation, 3x10   Lumbar trunk rotation: 2x10, VC's for form/technique, keeping stretch in pain free ROM.   Shoulder flexion with dowel for thoracic extension: 2x10 . VC's for exhalation with overhead raise and inhalation with  shoulder extension to starting position. Second set performed in hook lying with reports of improved muscular activation in shoulders and periscap muscles.    Paloff counter rotation press with YTB: 1x8/side. Required mod multimodal cuing for correct form and sequencing of band.    PT Education - 06/22/21 1014     Education Details form/technique with exercise.    Person(s) Educated Patient    Methods Explanation;Demonstration;Tactile cues;Verbal cues    Comprehension Verbalized understanding;Returned demonstration              PT Short Term Goals - 06/01/21 1113       PT SHORT TERM GOAL #1   Title Pt will be independent with his initial HEP to decrease pain, improve ability to ambulate uphill more comfortably.    Baseline Pt has started his  HEP (06/01/2021)    Time 3    Period Weeks    Status New    Target Date 06/25/21               PT Long Term Goals - 06/01/21 1114       PT LONG TERM GOAL #1   Title Pt will have a decrease in thoracic pain to 3/10 or less at worst to promote ability to breath as well as ambulate uphill or sit on his truck seat more comfortably.    Baseline 6/10 thoracic pain at worst for the past 3 months (06/01/2021)    Time 8    Period Weeks    Status New    Target Date 07/30/21      PT LONG TERM GOAL #2   Title Pt will improve bilateral scapular strength by at least 1/2 MMT grade to promote ability to breathe, as well as ambulate uphill more comfortably.    Baseline Manually resisted scapular retraction targeting the lower trap muscles 4-/5 R and L (06/01/2021)    Time 8    Period Weeks    Status New    Target Date 07/30/21      PT LONG TERM GOAL #3   Title Pt will improve his FOTO score by at least 10 points as a demonstration of improved function.    Baseline Rib FOTO 54 (06/01/2021)    Time 8    Period Weeks    Status New    Target Date 07/30/21                   Plan - 06/22/21 1045     Clinical Impression Statement  Continuing primary PT POC with decreasing lumbar rotation for neutral spinal positioning. Continuing to progress gentle lumbopelivc range of motion, hip strength, truncal/posural stability to pt's tolerance. Pt requires frequent rests due to mild SOB that resolves relatively wuickly with cuing for PLB. Pt will continue to benefit from skilled PT treatment to further reduce symptoms so pt can return to PLOF.    Personal Factors and Comorbidities Comorbidity 3+;Age;Fitness;Past/Current Experience;Time since onset of injury/illness/exacerbation    Comorbidities Anemia, CHF, CKD, CAD, DM, ischemic cardiomyopathy, dyspnea, venous insufficiency    Examination-Activity Limitations Locomotion Level;Sit    Stability/Clinical Decision Making Stable/Uncomplicated    Rehab Potential Fair    PT Frequency 2x / week    PT Duration 8 weeks    PT Treatment/Interventions Therapeutic activities;Therapeutic exercise;Neuromuscular re-education;Patient/family education;Manual techniques;Dry needling;Electrical Stimulation;Iontophoresis 4mg /ml Dexamethasone    PT Next Visit Plan posture, trunk scapular strengthening, manual techniques, modalities PRN    PT Home Exercise Plan Medbridge Access Code [email or web address]    Consulted and Agree with Plan of Care Patient             Patient will benefit from skilled therapeutic intervention in order to improve the following deficits and impairments:  Pain, Improper body mechanics, Postural dysfunction, Decreased strength, Difficulty walking, Decreased endurance, Decreased activity tolerance  Visit Diagnosis: Pain in thoracic spine  Radiculopathy, thoracic region     Problem List Patient Active Problem List   Diagnosis Date Noted   Acute on chronic systolic (congestive) heart failure (HCC) 02/15/2016   Fever 02/04/2016   Normocytic anemia 01/21/2016   Chronic osteomyelitis of right tibia (HCC) 11/19/2015   Pseudomonas aeruginosa infection 11/19/2015   Serratia infection  11/19/2015   Group B streptococcal infection 11/19/2015   Bacteroides fragilis 11/19/2015   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 Fairly IV, PT, DPT Physical Therapist- [place]  [facility]  06/22/2021, 10:59 AM   [facility] PHYS[facility] SPORTS MEDICINE [address] [place], [address] Phone: [phone]   Fax:  [phone]  Name: Vincent Brown MRN: [id number] Date of Birth: 1961/01/23

## 2021-06-22 NOTE — Patient Instructions (Addendum)
No Labs done today.   START wearing Nitroglycerin patch 0.2mg  (1 patch) daily. Please remember to take it off at night..   No other medication changes were made. Please continue all current medications as prescribed.  Your physician recommends that you schedule a follow-up appointment in: 4 months  If you have any questions or concerns before your next appointment please send [place] a message through Tower or call our office at [phone].    TO LEAVE A MESSAGE FOR THE NURSE SELECT OPTION 2, PLEASE LEAVE A MESSAGE INCLUDING: YOUR NAME DATE OF BIRTH CALL BACK NUMBER REASON FOR CALL**this is important as we prioritize the call backs  YOU WILL RECEIVE A CALL BACK THE SAME DAY AS LONG AS YOU CALL BEFORE 4:00 PM   Do the following things EVERYDAY: Weigh yourself in the morning before breakfast. Write it down and keep it in a log. Take your medicines as prescribed Eat low salt foods--Limit salt (sodium) to 2000 mg per day.  Stay as active as you can everyday Limit all fluids for the day to less than 2 liters   At the [facility], you and your health needs are our priority. As part of our continuing mission to provide you with exceptional heart care, we have created designated Provider Care Teams. These Care Teams include your primary Cardiologist (physician) and Advanced Practice Providers (APPs- Physician Assistants and Nurse Practitioners) who all work together to provide you with the care you need, when you need it.   You may see any of the following providers on your designated Care Team at your next follow up: Dr Arvilla Meres Dr Carron Curie, NP Robbie Lis, [place] Karle Plumber, PharmD   Please be sure to bring in all your medications bottles to every appointment.

## 2021-06-23 ENCOUNTER — Other Ambulatory Visit: Payer: Self-pay

## 2021-06-24 ENCOUNTER — Ambulatory Visit: Payer: 59

## 2021-06-24 DIAGNOSIS — M546 Pain in thoracic spine: Secondary | ICD-10-CM

## 2021-06-24 DIAGNOSIS — M5414 Radiculopathy, thoracic region: Secondary | ICD-10-CM

## 2021-06-24 NOTE — Therapy (Signed)
[facility]IC[facility] [address], [place], [address] Phone: [phone]   Fax:  [phone]  Physical Therapy Treatment  Patient Details  Name: Vincent Brown MRN: [id number] Date of Birth: 1961-06-04 Referring Provider (PT): Rodolph Bong, MD   Encounter Date: 06/24/2021   PT End of Session - 06/24/21 1018     Visit Number 8    Number of Visits 17    Date for PT Re-Evaluation 07/30/21    Authorization Type [employer] Primary, [employer] Employee secondary    Authorization Time Period 06/01/21-07/30/21    PT Start Time 1015    PT Stop Time 1057    PT Time Calculation (min) 42 min    Activity Tolerance Patient tolerated treatment well;Patient limited by fatigue;No increased pain    Behavior During Therapy WFL for tasks assessed/performed             Past Medical History:  Diagnosis Date   Anemia    a. 10/2015: symptomatic; requiring transfusion, EGD with antral gastritis. Anemia panel more suggestive of AOCD.   Bacteroides fragilis 11/19/2015   Chronic osteomyelitis of right tibia (HCC) 11/19/2015   Chronic systolic CHF (congestive heart failure) (HCC)    a. unable to tolerate lisinopril/ARB due to dizziness.    CKD (chronic kidney disease), stage II    Coronary artery disease 2012   a. s/p CABG - LIMA - LAD, SVG-OM2, SVG-PDA (11/2010). b. Abnl nuc 09/2015 - R/LHC 10/2015  R/LHC showed patent LIMA-LAD, and SVG-OM2, but occluded SVG-PDA; right heart pressures slightly elevated, otherwise no significant findings.   Diabetes mellitus 2003   Essential hypertension    Gastritis    Group B streptococcal infection 11/19/2015   Headache    Hyperlipidemia    Ischemic cardiomyopathy    Myocardial infarction (HCC)    Proliferative retinopathy 2015   treated with injection of Avastin.    Pseudomonas aeruginosa infection 11/19/2015   Serratia infection 11/19/2015   Shingles 2003   Shortness of breath dyspnea    Venous  insufficiency    a. severe venous insufficiency with previous R leg ulcers requiring vein stripping.    Past Surgical History:  Procedure Laterality Date   APPENDECTOMY  1971   APPLICATION OF A-CELL OF EXTREMITY Right 10/24/2015   Procedure: APPLICATION OF A-CELL AND PLACEMENT OF WOUND VAC;  Surgeon: Alena Bills Dillingham, DO;  [facility];  Service: Plastics;  Laterality: Right;   APPLICATION OF A-CELL OF EXTREMITY Right 01/29/2016   Procedure: APPLICATION OF A-CELL OF EXTREMITY;  Surgeon: Alena Bills Dillingham, DO;  [facility];  Service: Plastics;  Laterality: Right;   APPLICATION OF A-CELL OF EXTREMITY Right 04/07/2016   Procedure: APPLICATION OF A-CELL OF EXTREMITY AND VAC DRESSING CHANGE;  Surgeon: Alena Bills Dillingham, DO;  [facility];  Service: Plastics;  Laterality: Right;   APPLICATION OF WOUND VAC Right 01/29/2016   Procedure: APPLICATION OF WOUND VAC;  Surgeon: Alena Bills Dillingham, DO;  [facility];  Service: Plastics;  Laterality: Right;   CARDIAC CATHETERIZATION N/A 10/22/2015   Procedure: Right/Left Heart Cath and Coronary/Graft Angiography;  Surgeon: Dolores Patty, MD;  Location: [facility] INVASIVE CV LAB;  Service: Cardiovascular;  Laterality: N/A;   COLONOSCOPY  10/2012   Dr Silver Huguenin in [place]. mall sigmoid tics, small internal hemorrhoids.  otherwise normal screening study.  no polyps, telangectasia...   CORONARY ARTERY BYPASS GRAFT  11/05/2010   ESOPHAGOGASTRODUODENOSCOPY (EGD) WITH PROPOFOL N/A 10/21/2015  Procedure: ESOPHAGOGASTRODUODENOSCOPY (EGD) WITH PROPOFOL;  Surgeon: Napoleon Form, MD;  [facility];  Service: Endoscopy;  Laterality: N/A;   FRACTURE SURGERY      wrist   I & D EXTREMITY Right 10/24/2015   Procedure: IRRIGATION AND DEBRIDEMENT EXTREMITY;  Surgeon: Alena Bills Dillingham, DO;  [facility];  Service: Plastics;  Laterality: Right;   I & D EXTREMITY Right 01/29/2016   Procedure: RIGHT LEG WOUND IRRIGATION AND DEBRIDEMENT ;   Surgeon: Alena Bills Dillingham, DO;  [facility];  Service: Plastics;  Laterality: Right;   REFRACTIVE SURGERY  2009, 2015   RIGHT/LEFT HEART CATH AND CORONARY/GRAFT ANGIOGRAPHY N/A 02/27/2021   Procedure: RIGHT/LEFT HEART CATH AND CORONARY/GRAFT ANGIOGRAPHY;  Surgeon: Dolores Patty, MD;  [facility];  Service: Cardiovascular;  Laterality: N/A;   SKIN SPLIT GRAFT Right 04/07/2016   Procedure: SKIN GRAFT SPLIT THICKNESS TO RIGHT LOWER LEG;  Surgeon: Alena Bills Dillingham, DO;  [facility];  Service: Plastics;  Laterality: Right;   VARICOSE VEIN SURGERY  1995   right lower extremity    There were no vitals filed for this visit.   Subjective Assessment - 06/24/21 1017     Subjective Pt reports 2/10 NPS in rib cage. Both sides. No soreness or exacerbation of pain from previous session.    Pertinent History Epigastric and abdominal pain. Pain is located predominanly R 10th rib area, also feels pain L 10th rib area as well. Pain began around January/February 2022. Gradual onset. 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 No findings with GI or cardiaology appointment. Denies loss of bowel or bladder control or LE paresthesia. Pt is R hand dominant. Pain has stayed the same since onset.    Patient Stated Goals Decrease pain.    Currently in Pain? Yes    Pain Score 2     Pain Location Rib cage    Pain Orientation Right    Pain Descriptors / Indicators Sore    Pain Type Chronic pain    Pain Onset More than a month ago            There.ex:   Seated manually resisted trunk rotation isometrics in neutral (alternating)              L 10x5 seconds x2              R 10x5 seconds x 2    Seated lumbo-thoracic flexion and extension isometrics: 2x10, 5 sec holds  Seated AAROM PVC pipe shoulder flexion to promote thoracic extension: 3x10, with multimodal cuing for  form/technique with exercise. Slight post lean at end range shoulder flexion possibly due to shoulder elevation limitations or thoracic extension limitations   Scap retractions: 3x10 with blue TB. BORG RPE 0/10 scale, 5-6/10.  Low rows seated with yellow TB for low trap activation and promote upright posture. BORG RPE 0/10 scale, 5-6/10     PT Education - 06/24/21 1018     Education Details form/technique with exercise.    Person(s) Educated Patient    Methods Explanation;Demonstration;Tactile cues;Verbal cues    Comprehension Verbalized understanding;Returned demonstration              PT Short Term Goals - 06/01/21 1113       PT SHORT TERM GOAL #1   Title Pt will be independent  with his initial HEP to decrease pain, improve ability to ambulate uphill more comfortably.    Baseline Pt has started his HEP (06/01/2021)    Time 3    Period Weeks    Status New    Target Date 06/25/21               PT Long Term Goals - 06/01/21 1114       PT LONG TERM GOAL #1   Title Pt will have a decrease in thoracic pain to 3/10 or less at worst to promote ability to breath as well as ambulate uphill or sit on his truck seat more comfortably.    Baseline 6/10 thoracic pain at worst for the past 3 months (06/01/2021)    Time 8    Period Weeks    Status New    Target Date 07/30/21      PT LONG TERM GOAL #2   Title Pt will improve bilateral scapular strength by at least 1/2 MMT grade to promote ability to breathe, as well as ambulate uphill more comfortably.    Baseline Manually resisted scapular retraction targeting the lower trap muscles 4-/5 R and L (06/01/2021)    Time 8    Period Weeks    Status New    Target Date 07/30/21      PT LONG TERM GOAL #3   Title Pt will improve his FOTO score by at least 10 points as a demonstration of improved function.    Baseline Rib FOTO 54 (06/01/2021)    Time 8    Period Weeks    Status New    Target Date 07/30/21                    Plan - 06/24/21 1040     Clinical Impression Statement Pt continues to report no change in R/L rib cage with therapeutic exercise. PT continuing to focus on improving neutral spine alignment and uprigt posture in attemtps to improve pain. Mulitmodal cuing and rest breaks required d/t pt having SOB with exercises. Pt will continue to attempt to improve pain to return to PLOF.    Personal Factors and Comorbidities Comorbidity 3+;Age;Fitness;Past/Current Experience;Time since onset of injury/illness/exacerbation    Comorbidities Anemia, CHF, CKD, CAD, DM, ischemic cardiomyopathy, dyspnea, venous insufficiency    Examination-Activity Limitations Locomotion Level;Sit    Stability/Clinical Decision Making Stable/Uncomplicated    Clinical Decision Making Low    Rehab Potential Fair    PT Frequency 2x / week    PT Duration 8 weeks    PT Treatment/Interventions Therapeutic activities;Therapeutic exercise;Neuromuscular re-education;Patient/family education;Manual techniques;Dry needling;Electrical Stimulation;Iontophoresis 4mg /ml Dexamethasone    PT Next Visit Plan posture, trunk scapular strengthening, manual techniques, modalities PRN    PT Home Exercise Plan Medbridge Access Code [email or web address]    Consulted and Agree with Plan of Care Patient             Patient will benefit from skilled therapeutic intervention in order to improve the following deficits and impairments:  Pain, Improper body mechanics, Postural dysfunction, Decreased strength, Difficulty walking, Decreased endurance, Decreased activity tolerance  Visit Diagnosis: Pain in thoracic spine  Radiculopathy, thoracic region     Problem List Patient Active Problem List   Diagnosis Date Noted   Acute on chronic systolic (congestive) heart failure (HCC) 02/15/2016   Fever 02/04/2016   Normocytic anemia 01/21/2016   Chronic osteomyelitis of right tibia (HCC) 11/19/2015   Pseudomonas aeruginosa infection 11/19/2015   Serratia  infection 11/19/2015  Group B streptococcal infection 11/19/2015   Bacteroides fragilis 11/19/2015   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) 11/07/2015   Symptomatic anemia    Wound infection    Congestive heart failure (HCC)    DOE (dyspnea on exertion)    Abnormal finding on thallium stress test    Venous stasis ulcer (HCC) 08/26/2015   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 Fairly IV, PT, DPT Physical Therapist- [place]  [facility]  06/24/2021, 10:59 AM  [facility] PHYSICAL AND SPORTS MEDICINE [address], [place], [address] Phone: [phone]   Fax:  [phone]  Name: Vincent Brown MRN: [id number] Date of Birth: 03-13-1961

## 2021-06-29 ENCOUNTER — Ambulatory Visit: Payer: 59

## 2021-06-29 DIAGNOSIS — M5414 Radiculopathy, thoracic region: Secondary | ICD-10-CM | POA: Diagnosis not present

## 2021-06-29 DIAGNOSIS — M546 Pain in thoracic spine: Secondary | ICD-10-CM | POA: Diagnosis not present

## 2021-06-29 NOTE — Therapy (Signed)
[facility] [address], [place], [address] Phone: [phone]   Fax:  [phone]  Physical Therapy Treatment  Patient Details  Name: Vincent Brown MRN: [id number] Date of Birth: 01-Jan-1961 Referring Provider (PT): Rodolph Bong, MD   Encounter Date: 06/29/2021   PT End of Session - 06/29/21 1021     Visit Number 9    Number of Visits 17    Date for PT Re-Evaluation 07/30/21    Authorization Type [employer] Primary, [employer] Employee secondary    Authorization Time Period 06/01/21-07/30/21    PT Start Time 1020    PT Stop Time 1059    PT Time Calculation (min) 39 min    Activity Tolerance Patient tolerated treatment well;Patient limited by fatigue;No increased pain    Behavior During Therapy WFL for tasks assessed/performed             Past Medical History:  Diagnosis Date   Anemia    a. 10/2015: symptomatic; requiring transfusion, EGD with antral gastritis. Anemia panel more suggestive of AOCD.   Bacteroides fragilis 11/19/2015   Chronic osteomyelitis of right tibia (HCC) 11/19/2015   Chronic systolic CHF (congestive heart failure) (HCC)    a. unable to tolerate lisinopril/ARB due to dizziness.    CKD (chronic kidney disease), stage II    Coronary artery disease 2012   a. s/p CABG - LIMA - LAD, SVG-OM2, SVG-PDA (11/2010). b. Abnl nuc 09/2015 - R/LHC 10/2015  R/LHC showed patent LIMA-LAD, and SVG-OM2, but occluded SVG-PDA; right heart pressures slightly elevated, otherwise no significant findings.   Diabetes mellitus 2003   Essential hypertension    Gastritis    Group B streptococcal infection 11/19/2015   Headache    Hyperlipidemia    Ischemic cardiomyopathy    Myocardial infarction (HCC)    Proliferative retinopathy 2015   treated with injection of Avastin.    Pseudomonas aeruginosa infection 11/19/2015   Serratia infection 11/19/2015   Shingles 2003   Shortness of breath dyspnea    Venous  insufficiency    a. severe venous insufficiency with previous R leg ulcers requiring vein stripping.    Past Surgical History:  Procedure Laterality Date   APPENDECTOMY  1971   APPLICATION OF A-CELL OF EXTREMITY Right 10/24/2015   Procedure: APPLICATION OF A-CELL AND PLACEMENT OF WOUND VAC;  Surgeon: Alena Bills Dillingham, DO;  [facility];  Service: Plastics;  Laterality: Right;   APPLICATION OF A-CELL OF EXTREMITY Right 01/29/2016   Procedure: APPLICATION OF A-CELL OF EXTREMITY;  Surgeon: Alena Bills Dillingham, DO;  [facility];  Service: Plastics;  Laterality: Right;   APPLICATION OF A-CELL OF EXTREMITY Right 04/07/2016   Procedure: APPLICATION OF A-CELL OF EXTREMITY AND VAC DRESSING CHANGE;  Surgeon: Alena Bills Dillingham, DO;  [facility];  Service: Plastics;  Laterality: Right;   APPLICATION OF WOUND VAC Right 01/29/2016   Procedure: APPLICATION OF WOUND VAC;  Surgeon: Alena Bills Dillingham, DO;  [facility];  Service: Plastics;  Laterality: Right;   CARDIAC CATHETERIZATION N/A 10/22/2015   Procedure: Right/Left Heart Cath and Coronary/Graft Angiography;  Surgeon: Dolores Patty, MD;  Location: [facility] INVASIVE CV LAB;  Service: Cardiovascular;  Laterality: N/A;   COLONOSCOPY  10/2012   Dr Silver Huguenin in [place]. mall sigmoid tics, small internal hemorrhoids.  otherwise normal screening study.  no polyps, telangectasia...   CORONARY ARTERY BYPASS GRAFT  11/05/2010   ESOPHAGOGASTRODUODENOSCOPY (EGD) WITH PROPOFOL N/A 10/21/2015  Procedure: ESOPHAGOGASTRODUODENOSCOPY (EGD) WITH PROPOFOL;  Surgeon: Napoleon Form, MD;  [facility];  Service: Endoscopy;  Laterality: N/A;   FRACTURE SURGERY      wrist   I & D EXTREMITY Right 10/24/2015   Procedure: IRRIGATION AND DEBRIDEMENT EXTREMITY;  Surgeon: Alena Bills Dillingham, DO;  [facility];  Service: Plastics;  Laterality: Right;   I & D EXTREMITY Right 01/29/2016   Procedure: RIGHT LEG WOUND IRRIGATION AND DEBRIDEMENT ;   Surgeon: Alena Bills Dillingham, DO;  [facility];  Service: Plastics;  Laterality: Right;   REFRACTIVE SURGERY  2009, 2015   RIGHT/LEFT HEART CATH AND CORONARY/GRAFT ANGIOGRAPHY N/A 02/27/2021   Procedure: RIGHT/LEFT HEART CATH AND CORONARY/GRAFT ANGIOGRAPHY;  Surgeon: Dolores Patty, MD;  [facility];  Service: Cardiovascular;  Laterality: N/A;   SKIN SPLIT GRAFT Right 04/07/2016   Procedure: SKIN GRAFT SPLIT THICKNESS TO RIGHT LOWER LEG;  Surgeon: Alena Bills Dillingham, DO;  [facility];  Service: Plastics;  Laterality: Right;   VARICOSE VEIN SURGERY  1995   right lower extremity    There were no vitals filed for this visit.   Subjective Assessment - 06/29/21 1022     Subjective R rib is a little sore. 3/10 soreness currently (7/10 at worst for the past 7 days, Saturday night. 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 Went to his heart doctor last week which did not help with the soreness (had nitroglycerine patch L arm).    Pertinent History Epigastric and abdominal pain. Pain is located predominanly R 10th rib area, also feels pain L 10th rib area as well. Pain began around January/February 2022. Gradual onset. 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 No findings with GI or cardiaology appointment. Denies loss of bowel or bladder control or LE paresthesia. Pt is R hand dominant. Pain has stayed the same since onset.    Patient Stated Goals Decrease pain.    Currently in Pain? Yes    Pain Score 3     Pain Onset More than a month ago                                       PT Education - 06/29/21 1322     Education Details ther-ex, HEP    Person(s) Educated Patient    Methods Explanation;Demonstration;Tactile cues;Verbal cues;Handout     Comprehension Returned demonstration;Verbalized understanding           Objective    No latex allergies        Reproduction of symptoms with sitting upright and B shoulder flexion L > R   Medbridge Access Code [email or web address]  There.ex:      Gait x 400 ft   R rib tightness. L pelvic drop during R LE stance phase > L LE  SLS without UE assist, light touch assist PRN   R 6x5 seconds,   L 6x5 seconds   Trunk and bilateral glute med weakness observed.    Then with B UE assist, emphasis on level pelvis  R 10x5 seconds   L 10x5 seconds  Seated thoracic rotation   R 10x5 seconds, then 6x 5  seconds           Seated manually resisted trunk rotation isometrics in neutral                    R 10x5 seconds x 3       L 10x5 seconds x3  Decreased R rib soreness to 2/10 afterwards   Standing pallof press yellow band resistance  R resistance 5x5 seconds. Spine discomfort, eases with rest.                                      Response to treatment Pt tolerated session well without aggravation of pain.   Clinical impression Worked on improving bilateral glute med strength during stance phase of gait to help decrease stress to rib while ambulating. Decreased symptoms with treatment to promote trunk rotation strength isometrically.Challenges to progress include cachectic condition.  Pt will benefit from skilled physical therapy services to address the aforementioned deficits.                  PT Short Term Goals - 06/01/21 1113       PT SHORT TERM GOAL #1   Title Pt will be independent with his initial HEP to decrease pain, improve ability to ambulate uphill more comfortably.    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    Baseline 6/10 thoracic pain at worst for the past 3 months (06/01/2021)    Time 8    Period Weeks    Status New    Target Date 07/30/21      PT LONG TERM GOAL #2   Title Pt will improve bilateral scapular strength by at least 1/2 MMT grade to promote ability to breathe, as well as ambulate uphill more comfortably.    Baseline Manually resisted scapular retraction targeting the lower trap muscles 4-/5 R and L (06/01/2021)    Time 8    Period Weeks    Status New    Target Date 07/30/21      PT LONG TERM GOAL #3   Title Pt will improve his FOTO score by at least 10 points as a demonstration of improved function.    Baseline Rib FOTO 54 (06/01/2021)    Time 8    Period Weeks    Status New    Target Date 07/30/21                   Plan - 06/29/21 1324     Clinical Impression Statement Worked on improving bilateral glute med strength during stance phase of gait to help decrease stress to rib while ambulating. Decreased symptoms with treatment to promote trunk rotation strength isometrically.Challenges to progress include cachectic condition.  Pt will benefit from skilled physical therapy services to address the aforementioned deficits.    Personal Factors and Comorbidities Comorbidity 3+;Age;Fitness;Past/Current Experience;Time since onset of injury/illness/exacerbation  Comorbidities Anemia, CHF, CKD, CAD, DM, ischemic cardiomyopathy, dyspnea, venous insufficiency    Examination-Activity Limitations Locomotion Level;Sit    Stability/Clinical Decision Making Stable/Uncomplicated    Rehab Potential Fair    PT Frequency 2x / week    PT Duration 8 weeks    PT Treatment/Interventions Therapeutic activities;Therapeutic exercise;Neuromuscular re-education;Patient/family education;Manual techniques;Dry needling;Electrical Stimulation;Iontophoresis 4mg /ml Dexamethasone    PT Next Visit Plan posture, trunk scapular  strengthening, manual techniques, modalities PRN    PT Home Exercise Plan Medbridge Access Code [email or web address]    Consulted and Agree with Plan of Care Patient             Patient will benefit from skilled therapeutic intervention in order to improve the following deficits and impairments:  Pain, Improper body mechanics, Postural dysfunction, Decreased strength, Difficulty walking, Decreased endurance, Decreased activity tolerance  Visit Diagnosis: Pain in thoracic spine  Radiculopathy, thoracic region     Problem List Patient Active Problem List   Diagnosis Date Noted   Acute on chronic systolic (congestive) heart failure (HCC) 02/15/2016   Fever 02/04/2016   Normocytic anemia 01/21/2016   Chronic osteomyelitis of right tibia (HCC) 11/19/2015   Pseudomonas aeruginosa infection 11/19/2015   Serratia infection 11/19/2015   Group B streptococcal infection 11/19/2015   Bacteroides fragilis 11/19/2015   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) 11/07/2015   Symptomatic anemia    Wound infection    Congestive heart failure (HCC)    DOE (dyspnea on exertion)    Abnormal finding on thallium stress test    Venous stasis ulcer (HCC) 08/26/2015   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, [place] Phone: [phone]   Fax:  [phone]  Name: KELLAN RAFFIELD MRN: Gemma Payor Date of Birth: 05/10/1961

## 2021-06-29 NOTE — Patient Instructions (Signed)
Access Code: [email or web address] URL: [email or web address] Date: 06/29/2021 Prepared by: Loralyn Freshwater  Exercises Seated Trunk Rotation - Arms Crossed - 2 x daily - 7 x weekly - 2 sets - 10 reps - 5 seconds hold Seated Scapular Retraction - 3 x daily - 7 x weekly - 1 sets - 10 reps - 5 seconds hold Standing Single Leg Stance with Counter Support - 3 x daily - 7 x weekly - 1 sets - 10 reps - 5 seconds hold

## 2021-07-01 ENCOUNTER — Other Ambulatory Visit: Payer: Self-pay

## 2021-07-01 ENCOUNTER — Ambulatory Visit: Payer: 59

## 2021-07-01 DIAGNOSIS — M546 Pain in thoracic spine: Secondary | ICD-10-CM

## 2021-07-01 DIAGNOSIS — M5414 Radiculopathy, thoracic region: Secondary | ICD-10-CM | POA: Diagnosis not present

## 2021-07-01 NOTE — Therapy (Signed)
[facility] [address] [place], [address] Phone: [phone]   Fax:  [phone]  Physical Therapy Treatment/Reassessment and DC summary  Reporting period: 06/01/21-07/01/21  Patient Details  Name: Vincent Brown MRN: [id number] Date of Birth: [birth year] Referring Provider (PT): Gregor Hams, MD   Encounter Date: 07/01/2021   PT End of Session - 07/01/21 1025     Visit Number 10    Number of Visits 17    Date for PT Re-Evaluation 07/30/21    Authorization Type [employer] Primary, [employer] Employee secondary    Authorization Time Period 06/01/21-07/30/21    Progress Note Due on Visit 20    PT Start Time 1020    PT Stop Time 1050    PT Time Calculation (min) 30 min    Activity Tolerance Patient tolerated treatment well;Patient limited by fatigue;No increased pain    Behavior During Therapy WFL for tasks assessed/performed             Past Medical History:  Diagnosis Date   Anemia    a. 10/2015: symptomatic; requiring transfusion, EGD with antral gastritis. Anemia panel more suggestive of AOCD.   Bacteroides fragilis 11/19/2015   Chronic osteomyelitis of right tibia ([place]) 3/81/0175   Chronic systolic CHF (congestive heart failure) (HCC)    a. unable to tolerate lisinopril/ARB due to dizziness.    CKD (chronic kidney disease), stage II    Coronary artery disease 2012   a. s/p CABG - LIMA - LAD, SVG-OM2, SVG-PDA (11/2010). b. Abnl nuc 09/2015 - R/LHC 10/2015  R/LHC showed patent LIMA-LAD, and SVG-OM2, but occluded SVG-PDA; right heart pressures slightly elevated, otherwise no significant findings.   Diabetes mellitus 2003   Essential hypertension    Gastritis    Group B streptococcal infection 11/19/2015   Headache    Hyperlipidemia    Ischemic cardiomyopathy    Myocardial infarction ([place])    Proliferative retinopathy 2015   treated with injection of Avastin.    Pseudomonas aeruginosa infection 11/19/2015    Serratia infection 11/19/2015   Shingles 2003   Shortness of breath dyspnea    Venous insufficiency    a. severe venous insufficiency with previous R leg ulcers requiring vein stripping.    Past Surgical History:  Procedure Laterality Date   APPENDECTOMY  1025   APPLICATION OF A-CELL OF EXTREMITY Right 10/24/2015   Procedure: APPLICATION OF A-CELL AND PLACEMENT OF WOUND VAC;  Surgeon: Loel Lofty Dillingham, DO;  Location: [facility];  Service: Plastics;  Laterality: Right;   APPLICATION OF A-CELL OF EXTREMITY Right 01/29/2016   Procedure: APPLICATION OF A-CELL OF EXTREMITY;  Surgeon: Loel Lofty Dillingham, DO;  Location: [facility];  Service: Plastics;  Laterality: Right;   APPLICATION OF A-CELL OF EXTREMITY Right 04/07/2016   Procedure: APPLICATION OF A-CELL OF EXTREMITY AND VAC DRESSING CHANGE;  Surgeon: Loel Lofty Dillingham, DO;  Location: [facility];  Service: Plastics;  Laterality: Right;   APPLICATION OF WOUND VAC Right 01/29/2016   Procedure: APPLICATION OF WOUND VAC;  Surgeon: Loel Lofty Dillingham, DO;  Location: [facility];  Service: Plastics;  Laterality: Right;   CARDIAC CATHETERIZATION N/A 10/22/2015   Procedure: Right/Left Heart Cath and Coronary/Graft Angiography;  Surgeon: Jolaine Artist, MD;  Location: [facility] CV LAB;  Service: Cardiovascular;  Laterality: N/A;   COLONOSCOPY  10/2012   Dr April Manson in [place]. mall sigmoid tics, small internal hemorrhoids.  otherwise normal screening study.  no polyps, telangectasia.Marland KitchenMarland Kitchen  CORONARY ARTERY BYPASS GRAFT  11/05/2010   ESOPHAGOGASTRODUODENOSCOPY (EGD) WITH PROPOFOL N/A 10/21/2015   Procedure: ESOPHAGOGASTRODUODENOSCOPY (EGD) WITH PROPOFOL;  Surgeon: Mauri Pole, MD;  Location: [facility];  Service: Endoscopy;  Laterality: N/A;   FRACTURE SURGERY      wrist   I & D EXTREMITY Right 10/24/2015   Procedure: IRRIGATION AND DEBRIDEMENT EXTREMITY;  Surgeon: Loel Lofty Dillingham, DO;  Location: [facility];  Service: Plastics;  Laterality: Right;   I  & D EXTREMITY Right 01/29/2016   Procedure: RIGHT LEG WOUND IRRIGATION AND DEBRIDEMENT ;  Surgeon: Loel Lofty Dillingham, DO;  Location: [facility];  Service: Plastics;  Laterality: Right;   REFRACTIVE SURGERY  2009, 2015   RIGHT/LEFT HEART CATH AND CORONARY/GRAFT ANGIOGRAPHY N/A 02/27/2021   Procedure: RIGHT/LEFT HEART CATH AND CORONARY/GRAFT ANGIOGRAPHY;  Surgeon: Jolaine Artist, MD;  Location: [facility] CV LAB;  Service: Cardiovascular;  Laterality: N/A;   SKIN SPLIT GRAFT Right 04/07/2016   Procedure: SKIN GRAFT SPLIT THICKNESS TO RIGHT LOWER LEG;  Surgeon: Loel Lofty Dillingham, DO;  Location: [facility];  Service: Plastics;  Laterality: Right;   [place]   right lower extremity    There were no vitals filed for this visit.   Subjective Assessment - 07/01/21 1022     Subjective Pt reports having some Left low back pain since starting some recent PT interventions here. His RUQ pain is nil at present, may be getting better over, but mostly aggravated.    Pertinent History Epigastric and abdominal pain. Pain is located predominanly R 10th rib area, also feels pain L 10th rib area as well. Pain began around January/February 2022. Gradual onset. 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 No findings with GI or cardiaology appointment. Denies loss of bowel or bladder control or LE paresthesia. Pt is R hand dominant. Pain has stayed the same since onset.    How long can you walk comfortably? "1000 yard"    Patient Stated Goals Decrease pain.    Currently in Pain? Yes    Pain Score 2     Pain Location --   RUQ               [facility] PT Assessment - 07/01/21 0001       Observation/Other Assessments   Focus on Therapeutic Outcomes (FOTO)  58   FOTO 54     AROM   Right/Left Shoulder Right;Left    Right Shoulder Extension --    Right Shoulder Flexion --    Left  Shoulder Extension --    Left Shoulder Flexion --      Strength   Right Shoulder Flexion --   Posterior deltoid 5/5; middle trap: 4+/5   Right Shoulder ABduction --   Lower trap 5/5 (range limited, prone)   Left Shoulder Flexion --   Lower trap 5/5 (range limited, prone)   Left Shoulder ABduction --   Posterior deltoid 4+/5; middle trap: 4/5               -seated scpaular retraction 10 x 3-second hold Seated blue Thera-Band row 15 x 1 second hold, added to HEP, reviewed with handout -Seated left trunk rotation repeated range of motion (from previous HEP, reviewed in full with found out once again)         PT Short Term  Goals - 07/01/21 1026       PT SHORT TERM GOAL #1   Title Pt will be independent with his initial HEP to decrease pain, improve ability to ambulate uphill more comfortably.    Baseline Pt has started his HEP (06/01/2021)    Time 3    Period Weeks    Status Achieved               PT Long Term Goals - 07/01/21 1026       PT LONG TERM GOAL #1   Title Pt will have a decrease in thoracic pain to 3/10 or less at worst to promote ability to breath as well as ambulate uphill or sit on his truck seat more comfortably.    Baseline 6/10 thoracic pain at worst for the past 3 months (06/01/2021); 8/31: pt reports goal for pain is met    Time 8    Period Weeks    Status Achieved    Target Date 07/30/21      PT LONG TERM GOAL #2   Title Pt will improve bilateral scapular strength by at least 1/2 MMT grade to promote ability to breathe, as well as ambulate uphill more comfortably.    Baseline Manually resisted scapular retraction targeting the lower trap muscles 4-/5 R and L (06/01/2021); 07/01/21: grossly 4+/5 or greater.    Time 8    Period Weeks    Status Achieved    Target Date 07/30/21      PT LONG TERM GOAL #3   Title Pt will improve his FOTO score by at least 10 points as a demonstration of improved function.    Baseline Rib FOTO 54 (06/01/2021); FOTO:58     Time 8    Period Weeks    Status On-going    Target Date 07/30/21                   Plan - 07/01/21 1055     Clinical Impression Statement Patient here for 10th treatment visit, reassessment performed per protocol.  Exam reveals improvement in bilateral scapular strength, meeting patient's long-term treatment goal for this area.  Patient also showing improvement in Foto score however just below the long-term treatment goal threshold.  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  Patient reports severity of his symptomatic pain of the RUQ is continuing to decrease in a linear fashion, and when exacerbated pain resolves quite quickly with cessation of activity.  As patient has met 3 of 4 goals of treatment, patient will be discharged from PT services at this time.  HEP updated and reviewed with patient.  Patient encouraged to contact clinic if he feels to need further assistance in the future.    Personal Factors and Comorbidities Comorbidity 3+;Age;Fitness;Past/Current Experience;Time since onset of injury/illness/exacerbation    Comorbidities Anemia, CHF, CKD, CAD, DM, ischemic cardiomyopathy, dyspnea, venous insufficiency    Examination-Activity Limitations Locomotion Level;Sit    Stability/Clinical Decision Making Stable/Uncomplicated    Clinical Decision Making Low    Rehab Potential Good    PT Frequency 2x / week    PT Duration 8 weeks    PT Treatment/Interventions Therapeutic activities;Therapeutic exercise;Neuromuscular re-education;Patient/family education;Manual techniques;Dry needling;Electrical Stimulation;Iontophoresis 49m/ml Dexamethasone    PT Next Visit Plan [place] [facility] [place]  [facility] [place]    Consulted and Agree with Plan of Care Patient              Patient will benefit from skilled therapeutic intervention in order to improve the following deficits and impairments:  Pain, Improper body mechanics, Postural dysfunction, Decreased strength, Difficulty walking, Decreased endurance, Decreased activity tolerance  Visit Diagnosis: Pain in thoracic spine  Radiculopathy, thoracic region     Problem List Patient Active Problem List   Diagnosis Date Noted   Acute on chronic systolic (congestive) heart failure (HCC) 02/15/2016   Fever 02/04/2016   Normocytic anemia 01/21/2016   Chronic osteomyelitis of right tibia (HCC) 11/19/2015   Pseudomonas aeruginosa infection 11/19/2015   Serratia infection 11/19/2015   Group B streptococcal infection 11/19/2015   Bacteroides fragilis 11/19/2015   Non-pressure 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 lower leg with fat layer exposed ([place]) 11/07/2015   Symptomatic anemia    Wound infection    Congestive heart failure ([place])    DOE (dyspnea on exertion)    Abnormal finding on thallium stress test    Venous stasis ulcer ([place]) 08/26/2015   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 [place], [address] Phone: [phone]   Fax:  [phone]  Name: Vincent Brown MRN: [id number] Date of Birth: [birth year]

## 2021-07-05 ENCOUNTER — Other Ambulatory Visit: Payer: Self-pay | Admitting: Gastroenterology

## 2021-07-05 MED FILL — Digoxin Tab 125 MCG (0.125 MG): ORAL | 30 days supply | Qty: 30 | Fill #4 | Status: AC

## 2021-07-06 ENCOUNTER — Other Ambulatory Visit: Payer: Self-pay

## 2021-07-06 MED ORDER — FAMOTIDINE 20 MG PO TABS
20.0000 mg | ORAL_TABLET | Freq: Every evening | ORAL | 2 refills | Status: AC | PRN
  Filled 2021-07-06: qty 30, 30d supply, fill #0
  Filled 2021-08-24: qty 30, 30d supply, fill #1
  Filled 2021-10-22: qty 30, 30d supply, fill #2

## 2021-07-07 ENCOUNTER — Other Ambulatory Visit: Payer: Self-pay

## 2021-07-08 ENCOUNTER — Other Ambulatory Visit: Payer: Self-pay

## 2021-07-08 NOTE — Progress Notes (Signed)
   I, Christoper Fabian, LAT, ATC, am serving as scribe for Dr. Clementeen Graham.  Vincent Brown is a 60 y.o. male who presents to [facility] at [facility] today for f/u of RUQ pain just under his rib cage since Jan/Feb 2022.  He has a complicated medical hx and has seen gastroenterology for these c/o as well as others.  He does report suffering a fall in January after slipping on the ice but does not feel that this pain is associated w/ this injury.  He had a triple bypass in 2012.  He locates his pain to his RUQ just below his distal ant ribcage.  He was last seen by Dr. Denyse Amass on 04/27/21 and was referred to PT of which he completed 10 visits and has been d/c.  Today, pt reports that his abdominal pain has improved but now feels like he has mid-back pain for the last couple weeks.  Diagnostic imaging: CT abdomen/pelvis- 04/17/21  Pertinent review of systems: No fevers or chills  Relevant historical information: Heart failure.  History of chronic osteomyelitis of the tibia.  BMI 18.   Exam:  BP (!) 100/58 (BP Location: Right Arm, Patient Position: Sitting, Cuff Size: Normal)   Pulse 70   Ht 5\' 8"  (1.727 m)   Wt 121 lb 3.2 oz (55 kg)   SpO2 93%   BMI 18.43 kg/m  General: Thin protein calorie malnourished appearing man in no acute distress.   MSK: L-spine nontender midline.  Tender palpation right lumbar paraspinal musculature. Normal lumbar motion. Lower extremity strength is intact.    Lab and Radiology Results  X-ray images L-spine obtained today personally and independently interpreted Decreased bone density.  DDD L5-S1.  No acute fractures. Await formal radiology review     Assessment and Plan: 60 y.o. male with improved chronic right upper quadrant abdominal pain with physical therapy now with some right low back pain thought to be related to the increased activity from physical therapy.  Right low back pain thought to be more muscular related than anything else.   X-ray today shows some degenerative changes but does not show acute changes per my read.  Radiology overread is still pending. Discussed options.  Plan for a bit of watchful waiting with heating pad TENS unit and some simple core stabilizing activity.  If not improving in a month or 2 consider lumbar spine MRI.   PDMP not reviewed this encounter. Orders Placed This Encounter  Procedures   DG Lumbar Spine 2-3 Views    Standing Status:   Future    Number of Occurrences:   1    Standing Expiration Date:   07/09/2022    Order Specific Question:   Reason for Exam (SYMPTOM  OR DIAGNOSIS REQUIRED)    Answer:   eval low back pain    Order Specific Question:   Preferred imaging location?    Answer:   09/08/2022   Meds ordered this encounter  Medications   tiZANidine (ZANAFLEX) 4 MG tablet    Sig: Take 1 tablet (4 mg total) by mouth every 6 (six) hours as needed for muscle spasms.    Dispense:  30 tablet    Refill:  1     Discussed warning signs or symptoms. Please see discharge instructions. Patient expresses understanding.   The above documentation has been reviewed and is accurate and complete Kyra Searles, M.D.

## 2021-07-09 ENCOUNTER — Ambulatory Visit (INDEPENDENT_AMBULATORY_CARE_PROVIDER_SITE_OTHER): Payer: 59 | Admitting: Family Medicine

## 2021-07-09 ENCOUNTER — Other Ambulatory Visit: Payer: Self-pay

## 2021-07-09 ENCOUNTER — Ambulatory Visit (INDEPENDENT_AMBULATORY_CARE_PROVIDER_SITE_OTHER): Payer: 59

## 2021-07-09 ENCOUNTER — Encounter: Payer: Self-pay | Admitting: Family Medicine

## 2021-07-09 VITALS — BP 100/58 | HR 70 | Ht 68.0 in | Wt 121.2 lb

## 2021-07-09 DIAGNOSIS — M545 Low back pain, unspecified: Secondary | ICD-10-CM

## 2021-07-09 DIAGNOSIS — G8929 Other chronic pain: Secondary | ICD-10-CM

## 2021-07-09 MED ORDER — TIZANIDINE HCL 4 MG PO TABS
4.0000 mg | ORAL_TABLET | Freq: Four times a day (QID) | ORAL | 1 refills | Status: DC | PRN
  Filled 2021-07-09: qty 30, 8d supply, fill #0
  Filled 2021-07-26 – 2021-07-27 (×2): qty 30, 8d supply, fill #1

## 2021-07-09 NOTE — Patient Instructions (Signed)
Thank you for coming in today.   Please get an Xray today before you leave   Try the tizanidine muscle relaxer mostly at bedtime as needed for muscle spasms and back pain  Try a heating pad on the back.  If not doing well one of my next steps would be MRI of the lumbar spine for the low back pain.  Let me know and I can order that.

## 2021-07-13 NOTE — Progress Notes (Signed)
Lumbar spine x-ray shows some arthritis in the low back but otherwise looks okay.

## 2021-07-27 ENCOUNTER — Other Ambulatory Visit: Payer: Self-pay

## 2021-08-03 DIAGNOSIS — I10 Essential (primary) hypertension: Secondary | ICD-10-CM | POA: Diagnosis not present

## 2021-08-03 DIAGNOSIS — Z23 Encounter for immunization: Secondary | ICD-10-CM | POA: Diagnosis not present

## 2021-08-03 DIAGNOSIS — R634 Abnormal weight loss: Secondary | ICD-10-CM | POA: Diagnosis not present

## 2021-08-03 DIAGNOSIS — E119 Type 2 diabetes mellitus without complications: Secondary | ICD-10-CM | POA: Diagnosis not present

## 2021-08-03 DIAGNOSIS — I25810 Atherosclerosis of coronary artery bypass graft(s) without angina pectoris: Secondary | ICD-10-CM | POA: Diagnosis not present

## 2021-08-09 ENCOUNTER — Other Ambulatory Visit (HOSPITAL_COMMUNITY): Payer: Self-pay | Admitting: Internal Medicine

## 2021-08-10 ENCOUNTER — Other Ambulatory Visit: Payer: Self-pay

## 2021-08-10 ENCOUNTER — Other Ambulatory Visit (HOSPITAL_COMMUNITY): Payer: Self-pay | Admitting: Internal Medicine

## 2021-08-10 MED FILL — Digoxin Tab 125 MCG (0.125 MG): ORAL | 30 days supply | Qty: 30 | Fill #0 | Status: AC

## 2021-08-11 ENCOUNTER — Other Ambulatory Visit: Payer: Self-pay

## 2021-08-18 DIAGNOSIS — R82998 Other abnormal findings in urine: Secondary | ICD-10-CM | POA: Diagnosis not present

## 2021-08-24 ENCOUNTER — Other Ambulatory Visit: Payer: Self-pay | Admitting: Family Medicine

## 2021-08-24 ENCOUNTER — Other Ambulatory Visit: Payer: Self-pay

## 2021-08-24 MED ORDER — TIZANIDINE HCL 4 MG PO TABS
4.0000 mg | ORAL_TABLET | Freq: Four times a day (QID) | ORAL | 1 refills | Status: DC | PRN
  Filled 2021-08-24: qty 30, 8d supply, fill #0
  Filled 2021-09-14: qty 30, 8d supply, fill #1

## 2021-08-24 NOTE — Telephone Encounter (Signed)
Rx refill request approved per Dr. Corey's orders. 

## 2021-09-08 ENCOUNTER — Other Ambulatory Visit: Payer: Self-pay | Admitting: Family Medicine

## 2021-09-08 ENCOUNTER — Other Ambulatory Visit: Payer: Self-pay

## 2021-09-09 ENCOUNTER — Other Ambulatory Visit: Payer: Self-pay

## 2021-09-09 MED FILL — Metoprolol Succinate Tab ER 24HR 50 MG (Tartrate Equiv): ORAL | 90 days supply | Qty: 135 | Fill #0 | Status: AC

## 2021-09-14 ENCOUNTER — Other Ambulatory Visit: Payer: Self-pay

## 2021-09-28 MED FILL — Digoxin Tab 125 MCG (0.125 MG): ORAL | 30 days supply | Qty: 30 | Fill #1 | Status: AC

## 2021-09-29 ENCOUNTER — Other Ambulatory Visit: Payer: Self-pay

## 2021-09-30 ENCOUNTER — Other Ambulatory Visit: Payer: Self-pay

## 2021-10-20 ENCOUNTER — Ambulatory Visit (HOSPITAL_COMMUNITY)
Admission: RE | Admit: 2021-10-20 | Discharge: 2021-10-20 | Disposition: A | Discharge status: Home / Self Care | Payer: 59 | Source: Ambulatory Visit | Attending: Internal Medicine | Admitting: Internal Medicine

## 2021-10-20 ENCOUNTER — Other Ambulatory Visit: Payer: Self-pay

## 2021-10-20 VITALS — BP 108/72 | HR 87 | Wt 117.8 lb

## 2021-10-20 DIAGNOSIS — Z79899 Other long term (current) drug therapy: Secondary | ICD-10-CM | POA: Insufficient documentation

## 2021-10-20 DIAGNOSIS — R42 Dizziness and giddiness: Secondary | ICD-10-CM | POA: Insufficient documentation

## 2021-10-20 DIAGNOSIS — Z7982 Long term (current) use of aspirin: Secondary | ICD-10-CM | POA: Diagnosis not present

## 2021-10-20 DIAGNOSIS — R6881 Early satiety: Secondary | ICD-10-CM | POA: Diagnosis not present

## 2021-10-20 DIAGNOSIS — I2510 Atherosclerotic heart disease of native coronary artery without angina pectoris: Secondary | ICD-10-CM | POA: Diagnosis not present

## 2021-10-20 DIAGNOSIS — I5022 Chronic systolic (congestive) heart failure: Secondary | ICD-10-CM | POA: Insufficient documentation

## 2021-10-20 DIAGNOSIS — K5900 Constipation, unspecified: Secondary | ICD-10-CM | POA: Insufficient documentation

## 2021-10-20 DIAGNOSIS — I130 Hypertensive heart and chronic kidney disease with heart failure and stage 1 through stage 4 chronic kidney disease, or unspecified chronic kidney disease: Secondary | ICD-10-CM | POA: Diagnosis not present

## 2021-10-20 DIAGNOSIS — I252 Old myocardial infarction: Secondary | ICD-10-CM | POA: Diagnosis not present

## 2021-10-20 DIAGNOSIS — N182 Chronic kidney disease, stage 2 (mild): Secondary | ICD-10-CM | POA: Diagnosis not present

## 2021-10-20 DIAGNOSIS — I255 Ischemic cardiomyopathy: Secondary | ICD-10-CM | POA: Diagnosis not present

## 2021-10-20 DIAGNOSIS — Z951 Presence of aortocoronary bypass graft: Secondary | ICD-10-CM | POA: Insufficient documentation

## 2021-10-20 DIAGNOSIS — E1122 Type 2 diabetes mellitus with diabetic chronic kidney disease: Secondary | ICD-10-CM | POA: Diagnosis not present

## 2021-10-20 DIAGNOSIS — Z7901 Long term (current) use of anticoagulants: Secondary | ICD-10-CM | POA: Diagnosis not present

## 2021-10-20 LAB — BASIC METABOLIC PANEL
Anion gap: 5 (ref 5–15)
BUN: 29 mg/dL — ABNORMAL HIGH (ref 8–23)
CO2: 34 mmol/L — ABNORMAL HIGH (ref 22–32)
Calcium: 9.1 mg/dL (ref 8.9–10.3)
Chloride: 96 mmol/L — ABNORMAL LOW (ref 98–111)
Creatinine, Ser: 0.87 mg/dL (ref 0.61–1.24)
GFR, Estimated: >60 mL/min (ref >60)
Glucose, Bld: 115 mg/dL — ABNORMAL HIGH (ref 70–99)
Potassium: 4.7 mmol/L (ref 3.5–5.1)
Sodium: 135 mmol/L (ref 135–145)

## 2021-10-20 LAB — CBC
HCT: 39.2 % (ref 39.0–52.0)
Hemoglobin: 12.7 g/dL — ABNORMAL LOW (ref 13.0–17.0)
MCH: 30.2 pg (ref 26.0–34.0)
MCHC: 32.4 g/dL (ref 30.0–36.0)
MCV: 93.1 fL (ref 80.0–100.0)
Platelets: 161 10*3/uL (ref 150–400)
RBC: 4.21 MIL/uL — ABNORMAL LOW (ref 4.22–5.81)
RDW: 12.6 % (ref 11.5–15.5)
WBC: 5.8 10*3/uL (ref 4.0–10.5)
nRBC: 0 % (ref 0.0–0.2)

## 2021-10-20 LAB — DIGOXIN LEVEL: Digoxin Level: 0.7 ng/mL — ABNORMAL LOW (ref 0.8–2.0)

## 2021-10-20 LAB — BRAIN NATRIURETIC PEPTIDE: B Natriuretic Peptide: 701.4 pg/mL — ABNORMAL HIGH (ref 0.0–100.0)

## 2021-10-20 MED ORDER — METOPROLOL SUCCINATE ER 25 MG PO TB24
25.0000 mg | ORAL_TABLET | Freq: Every day | ORAL | 11 refills | Status: DC
  Filled 2021-10-20: qty 30, 30d supply, fill #0

## 2021-10-20 NOTE — Patient Instructions (Signed)
Medication Changes:  Decrease Metoprolol to 25 mg Daily  Lab Work:  Labs done today, your results will be available in MyChart, we will contact you for abnormal readings.  Testing/Procedures:  None  Referrals:  None  Special Instructions // Education:  Do the following things EVERYDAY: Weigh yourself in the morning before breakfast. Write it down and keep it in a log. Take your medicines as prescribed Eat low salt foods--Limit salt (sodium) to 2000 mg per day.  Stay as active as you can everyday Limit all fluids for the day to less than 2 liters  Follow-Up in: 6 months (June 2023), **PLEASE CALL OUR OFFICE IN April TO SCHEDULE THIS APPOINTMENT  At the [facility], you and your health needs are our priority. We have a designated team specialized in the treatment of Heart Failure. This Care Team includes your primary Heart Failure Specialized Cardiologist (physician), Advanced Practice Providers (APPs- Physician Assistants and Nurse Practitioners), and Pharmacist who all work together to provide you with the care you need, when you need it.   You may see any of the following providers on your designated Care Team at your next follow up:  Dr Arvilla Meres Dr Carron Curie, NP Robbie Lis, [place] [facility] [place], [place] Karle Plumber, PharmD   Please be sure to bring in all your medications bottles to every appointment.   Need to Contact us:  If you have any questions or concerns before your next appointment please send [place] a message through [place] or call our office at [phone].    TO LEAVE A MESSAGE FOR THE NURSE SELECT OPTION 2, PLEASE LEAVE A MESSAGE INCLUDING: YOUR NAME DATE OF BIRTH CALL BACK NUMBER REASON FOR CALL**this is important as we prioritize the call backs  YOU WILL RECEIVE A CALL BACK THE SAME DAY AS LONG AS YOU CALL BEFORE 4:00 PM

## 2021-10-20 NOTE — Progress Notes (Signed)
Patient ID: EIN BUDZ, male   DOB: 1961/08/09, 60 y.o.   MRN: [id number]    [facility]  Date:  10/20/2021  Patient ID:  Vincent Brown, Vincent Brown April 07, 1961, MRN [id number] PCP:  Juluis Pitch, MD  Cardiologist:  DBensimhon (CHF clinic)    Subjective: Vincent Brown is a 60 y.o. 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  Had a NSTEMI in 1/12 after presenting with bad cough. Had cath at [facility]. LAD 100% LCX 70% RCA p70%, d100%. EF 25%. Cardiac MRI: EF 15% anterior wall and inferior wall infarct with significant viability -> CABG  Unable to tolerate lisinopril or losartan due to dizziness. Spironolactone stopped due to hyperkalemia. Unable to tolerate Jardiance so stopped. Refuses ICD.   In 2017 had RLE chronic wound and MRI was suggestive of osteomyelitis. Vascular was consulted and felt AKA was the best option but the patient was not willing to have this at this time. Plastic surgery was consulted and performed debridement/wound vac. Healed completely.   Echo 02/16/21: EF 15% RV mild to moderately down   Cath 4/22  Ao = 113/60 (81) LV = 115/20 RA = 3 RV = 36/1 PA = 40/15 (25) PCW = N/A Fick cardiac output/index = 5.9/3.3 PVR =  0.9 WU SVR = 1,064 ao sat = 99% PA sat = 74%, 73% High SVC = 73%   Assessment: 1. Stable CAD with patent grafts without change from prior 2. Severe iCM EF 15-20% 3. Mildly elevated filling pressures with normal cardiac output   Today he returns for HF follow up with his wife. At last visit I was worried about progressive HF symptoms. NYHA IIIB. Continues to struggle with NYHA IIIB symptoms. Continues to lose weight. + early satiety. No CP. Still feeds the cows 2x/day. Dizzy if he stands up too quickly/   ECHO 12/2011: EF 25-30% ECHO 03/14/13 EF  25% ECHO 12/15: EF 25%, diffuse hypokinesis, normal RV size, mildly decreased RV systolic function, PA systolic pressure 40 mmHg    Past Medical History:  Diagnosis Date   Anemia    a. 10/2015: symptomatic; requiring transfusion, EGD with antral gastritis. Anemia panel more suggestive of AOCD.   Bacteroides fragilis 11/19/2015   Chronic osteomyelitis of right tibia ([place]) 99991111   Chronic systolic CHF (congestive heart failure) (HCC)    a. unable to tolerate lisinopril/ARB due to dizziness.    CKD (chronic kidney disease), stage II    Coronary artery disease 2012   a. s/p CABG - LIMA - LAD, SVG-OM2, SVG-PDA (11/2010). b. Abnl nuc 09/2015 - R/LHC 10/2015  R/LHC showed patent LIMA-LAD, and SVG-OM2, but occluded SVG-PDA; right heart pressures slightly elevated, otherwise no significant findings.   Diabetes mellitus 2003   Essential hypertension    Gastritis    Group B streptococcal infection 11/19/2015   Headache    Hyperlipidemia    Ischemic cardiomyopathy    Myocardial infarction ([place])    Proliferative retinopathy 2015   treated with injection of Avastin.    Pseudomonas aeruginosa infection 11/19/2015   Serratia infection 11/19/2015   Shingles 2003   Shortness of breath dyspnea    Venous insufficiency    a. severe venous insufficiency with previous R leg ulcers requiring vein stripping.    Past Surgical History:  Procedure Laterality Date  APPENDECTOMY  1971   APPLICATION OF A-CELL OF EXTREMITY Right 10/24/2015   Procedure: APPLICATION OF A-CELL AND PLACEMENT OF WOUND VAC;  Surgeon: Alena Bills Dillingham, DO;  Location: [facility] OR;  Service: Plastics;  Laterality: Right;   APPLICATION OF A-CELL OF EXTREMITY Right 01/29/2016   Procedure: APPLICATION OF A-CELL OF EXTREMITY;  Surgeon: Alena Bills Dillingham, DO;  Location: [facility] OR;  Service: Plastics;  Laterality: Right;   APPLICATION OF A-CELL OF EXTREMITY Right 04/07/2016   Procedure: APPLICATION OF A-CELL OF EXTREMITY AND VAC DRESSING CHANGE;   Surgeon: Alena Bills Dillingham, DO;  Location: [facility] OR;  Service: Plastics;  Laterality: Right;   APPLICATION OF WOUND VAC Right 01/29/2016   Procedure: APPLICATION OF WOUND VAC;  Surgeon: Alena Bills Dillingham, DO;  Location: [facility] OR;  Service: Plastics;  Laterality: Right;   CARDIAC CATHETERIZATION N/A 10/22/2015   Procedure: Right/Left Heart Cath and Coronary/Graft Angiography;  Surgeon: Dolores Patty, MD;  Location: [facility] INVASIVE CV LAB;  Service: Cardiovascular;  Laterality: N/A;   COLONOSCOPY  10/2012   Dr Silver Huguenin in [place]. mall sigmoid tics, small internal hemorrhoids.  otherwise normal screening study.  no polyps, telangectasia...   CORONARY ARTERY BYPASS GRAFT  11/05/2010   ESOPHAGOGASTRODUODENOSCOPY (EGD) WITH PROPOFOL N/A 10/21/2015   Procedure: ESOPHAGOGASTRODUODENOSCOPY (EGD) WITH PROPOFOL;  Surgeon: Napoleon Form, MD;  Location: [facility] ENDOSCOPY;  Service: Endoscopy;  Laterality: N/A;   FRACTURE SURGERY      wrist   I & D EXTREMITY Right 10/24/2015   Procedure: IRRIGATION AND DEBRIDEMENT EXTREMITY;  Surgeon: Alena Bills Dillingham, DO;  Location: [facility];  Service: Plastics;  Laterality: Right;   I & D EXTREMITY Right 01/29/2016   Procedure: RIGHT LEG WOUND IRRIGATION AND DEBRIDEMENT ;  Surgeon: Alena Bills Dillingham, DO;  Location: [facility];  Service: Plastics;  Laterality: Right;   REFRACTIVE SURGERY  2009, 2015   RIGHT/LEFT HEART CATH AND CORONARY/GRAFT ANGIOGRAPHY N/A 02/27/2021   Procedure: RIGHT/LEFT HEART CATH AND CORONARY/GRAFT ANGIOGRAPHY;  Surgeon: Dolores Patty, MD;  Location: [facility] INVAS[facility];  Service: Cardiovascular;  Laterality: N/A;   SKIN SPLIT GRAFT Right 04/07/2016   Procedure: SKIN GRAFT SPLIT THICKNESS TO RIGHT LOWER LEG;  Surgeon: Alena Bills Dillingham, DO;  Location: [facility] OR;  Service: Plastics;  Laterality: Right;   VARICOSE VEIN SURGERY  1995   right lower extremity    Current Outpatient Medications  Medication Sig Dispense Refill   Acetaminophen 500 MG  coapsule Take 500 mg by mouth every 4 (four) hours as needed for pain.     atorvastatin (LIPITOR) 20 MG tablet TAKE 1 TABLET BY MOUTH ONCE DAILY 90 tablet 1   cyclobenzaprine (FLEXERIL) 5 MG tablet Take 5 mg by mouth 3 (three) times daily as needed for muscle spasms.     digoxin (LANOXIN) 0.125 MG tablet TAKE 1 TABLET (0.125 MG TOTAL) BY MOUTH DAILY. 30 tablet 11   docusate sodium (COLACE) 100 MG capsule Take 100 mg by mouth daily.     famotidine (PEPCID) 20 MG tablet Take 1 tablet (20 mg total) by mouth at bedtime as needed for heartburn or indigestion. 30 tablet 2   furosemide (LASIX) 80 MG tablet Take 80 mg by mouth daily as needed for fluid or edema.     glimepiride (AMARYL) 4 MG tablet Take 1 tablet (4 mg total) by mouth 2 (two) times daily 180 tablet 1   metoprolol succinate (TOPROL-XL) 50 MG 24 hr tablet take 1 1/2 tablet by mouth once daily  45 tablet 6   pantoprazole (PROTONIX) 40 MG tablet Take 1 tablet (40 mg total) by mouth 2 (two) times daily. Take one tablet 30-60 minutes before breakfast and dinner. 60 tablet 5   tiZANidine (ZANAFLEX) 4 MG tablet Take 1 tablet (4 mg total) by mouth every 6 (six) hours as needed for muscle spasms. 30 tablet 1   No current facility-administered medications for this encounter.    Allergies:   Patient has no known allergies.   Social History:  The patient  reports that he has never smoked. He has never used smokeless tobacco. He reports that he does not drink alcohol and does not use drugs.   Family History:  The patient's family history includes Hypertension in his father; Other in his father; Parkinson's disease in his father.  ROS:  Please see the history of present illness.    All other systems are reviewed and otherwise negative.   Wt Readings from Last 3 Encounters:  10/20/21 53.4 kg (117 lb 12.8 oz)  07/09/21 55 kg (121 lb 3.2 oz)  06/22/21 56.4 kg (124 lb 6.4 oz)   Vitals:   10/20/21 1415  BP: 108/72  Pulse: 87  SpO2: 98%  Weight:  53.4 kg (117 lb 12.8 oz)     PHYSICAL EXAM:  General:  Thin male. Weak appearing No resp difficulty HEENT: normal Neck: supple. no JVD. Carotids 2+ bilat; no bruits. No lymphadenopathy or thryomegaly appreciated. Cor: PMI nondisplaced. Regular rate & rhythm. No rubs, gallops or murmurs. Lungs: clear Abdomen: soft, nontender, nondistended. No hepatosplenomegaly. No bruits or masses. Good bowel sounds. Extremities: no cyanosis, clubbing, rash, edema + healed wound Neuro: alert & orientedx3, cranial nerves grossly intact. moves all 4 extremities w/o difficulty. Affect pleasant   Recent Labs: 04/14/2021: ALT 9; BUN 27; Creatinine, Ser 0.96; Hemoglobin 11.8; Platelets 155.0; Potassium 4.6; Sodium 137  No results found for requested labs within last 8760 hours.   CrCl cannot be calculated (Patient's most recent lab result is older than the maximum 21 days allowed.).   Wt Readings from Last 3 Encounters:  10/20/21 53.4 kg (117 lb 12.8 oz)  07/09/21 55 kg (121 lb 3.2 oz)  06/22/21 56.4 kg (124 lb 6.4 oz)     Other studies reviewed: Additional studies/records reviewed today include: summarized above  ASSESSMENT AND PLAN:  1. Chronic systolic CHF/ICM  -iCM EF 15-20% (echo 12/16). - Echo 6/20 EF 20-25% mild RV HK - Echo 4/22 LVEF 15% RV mild to moderately down - R/L cath as 5/22 as above. Stable CAD with patent grafts. CI 3.3 - Progressive NYHA IIIB symptoms - Continue digoxin 0.125  - Decrease toprol 50mg  daily -> 25 mg daily. Due to low output - Intolerant to losartan and lisinopril due to hypotension.  - Intolerant spironolactone due to hyperkalemia  - We started Jardiance previously but he said sugar went up so PCP changed back. I tried to explain that CV benefit indepenedent of glycemic control but he refused. Remains unnwilling to reconsider.  - He has refused ICD - Now end-stage CM which we discussed frankly.  - Unfortunately he is not candidate for VAD or transplant  given  cachexia and other issues. - Labs today  2. CAD s/p CABG as above - Cath 5/22 stable. Severe 3v CAD as above with patent LIMA to LAD and patent SVG to OM  - SVG to RPDA is occluded but there are good L->R and R->R collaterals - No s/s angina. Has tried NTG patch in  past but didn't feel like it gave him any benefit.  - Continue ASA/statin. Refuses Jardiance  3. DMII - followed by PCP. Refuses SGLT2i as above - HgBa1c well controlled per his report  4. Constipation - recommended Miralax + Milk of Mag prn   Glori Bickers MD 2:21 PM

## 2021-10-22 ENCOUNTER — Other Ambulatory Visit: Payer: Self-pay

## 2021-10-22 ENCOUNTER — Other Ambulatory Visit: Payer: Self-pay | Admitting: Family Medicine

## 2021-10-23 ENCOUNTER — Other Ambulatory Visit: Payer: Self-pay

## 2021-10-23 MED ORDER — TIZANIDINE HCL 4 MG PO TABS
4.0000 mg | ORAL_TABLET | Freq: Four times a day (QID) | ORAL | 1 refills | Status: AC | PRN
  Filled 2021-10-23: qty 30, 8d supply, fill #0

## 2021-11-09 ENCOUNTER — Other Ambulatory Visit: Payer: Self-pay

## 2022-02-02 DIAGNOSIS — I509 Heart failure, unspecified: Secondary | ICD-10-CM | POA: Diagnosis not present

## 2022-02-02 DIAGNOSIS — E11319 Type 2 diabetes mellitus with unspecified diabetic retinopathy without macular edema: Secondary | ICD-10-CM | POA: Diagnosis not present

## 2022-02-02 DIAGNOSIS — Z0000 Encounter for general adult medical examination without abnormal findings: Secondary | ICD-10-CM | POA: Diagnosis not present

## 2022-02-02 DIAGNOSIS — I110 Hypertensive heart disease with heart failure: Secondary | ICD-10-CM | POA: Diagnosis not present

## 2022-02-02 DIAGNOSIS — I25810 Atherosclerosis of coronary artery bypass graft(s) without angina pectoris: Secondary | ICD-10-CM | POA: Diagnosis not present

## 2022-02-02 DIAGNOSIS — R634 Abnormal weight loss: Secondary | ICD-10-CM | POA: Diagnosis not present

## 2022-02-02 DIAGNOSIS — I10 Essential (primary) hypertension: Secondary | ICD-10-CM | POA: Diagnosis not present

## 2022-02-02 DIAGNOSIS — E785 Hyperlipidemia, unspecified: Secondary | ICD-10-CM | POA: Diagnosis not present

## 2022-02-02 DIAGNOSIS — Z125 Encounter for screening for malignant neoplasm of prostate: Secondary | ICD-10-CM | POA: Diagnosis not present

## 2022-02-02 DIAGNOSIS — I2510 Atherosclerotic heart disease of native coronary artery without angina pectoris: Secondary | ICD-10-CM | POA: Diagnosis not present

## 2022-02-04 ENCOUNTER — Encounter: Payer: Self-pay | Admitting: Gastroenterology

## 2022-02-08 ENCOUNTER — Encounter: Payer: Self-pay | Admitting: Hematology and Oncology

## 2022-02-15 ENCOUNTER — Ambulatory Visit: Payer: Medicare HMO | Admitting: Gastroenterology

## 2022-02-15 ENCOUNTER — Other Ambulatory Visit (INDEPENDENT_AMBULATORY_CARE_PROVIDER_SITE_OTHER): Payer: Medicare HMO

## 2022-02-15 ENCOUNTER — Encounter: Payer: Self-pay | Admitting: Gastroenterology

## 2022-02-15 ENCOUNTER — Encounter: Payer: Self-pay | Admitting: Hematology and Oncology

## 2022-02-15 VITALS — BP 106/68 | HR 89 | Ht 70.0 in | Wt 104.8 lb

## 2022-02-15 DIAGNOSIS — R0609 Other forms of dyspnea: Secondary | ICD-10-CM

## 2022-02-15 DIAGNOSIS — I509 Heart failure, unspecified: Secondary | ICD-10-CM | POA: Diagnosis not present

## 2022-02-15 DIAGNOSIS — R634 Abnormal weight loss: Secondary | ICD-10-CM | POA: Diagnosis not present

## 2022-02-15 LAB — BASIC METABOLIC PANEL
BUN: 34 mg/dL — ABNORMAL HIGH (ref 6–23)
CO2: 42 mEq/L — ABNORMAL HIGH (ref 19–32)
Calcium: 9.3 mg/dL (ref 8.4–10.5)
Chloride: 95 mEq/L — ABNORMAL LOW (ref 96–112)
Creatinine, Ser: 0.78 mg/dL (ref 0.40–1.50)
GFR: 95.14 mL/min (ref >60.00)
Glucose, Bld: 210 mg/dL — ABNORMAL HIGH (ref 70–99)
Potassium: 4.3 mEq/L (ref 3.5–5.1)
Sodium: 139 mEq/L (ref 135–145)

## 2022-02-15 NOTE — Progress Notes (Signed)
? ?       ? ?Vincent Brown    [id number]    02-21-1961 ? ?Primary Care Physician:Bronstein, Shanon Brow, MD ? ?Referring Physician: Juluis Pitch, MD ?[address] ?[place],  [place] [id number] ? ? ?Chief complaint: Weight loss ? ?HPI: ? ?61 year old very pleasant male with history of severe congestive heart failure here with complaints of ongoing weight loss associated with decreased appetite, nausea, generalized abdominal discomfort, GERD and irregular bowel habits with alternating constipation and diarrhea ? ?He was last seen in 2017, had work-up for anemia at the time that was unrevealing for any significant pathology ?Small bowel video capsule 12/18/2015: Negative ?Colonoscopy in 2013 was normal other than diverticulosis and internal hemorrhoids ?EGD December 2016  showed mild antral erosive gastritis .  ? ?He is not a candidate for heart transplant or LVAD.  He is experiencing progressive weight loss.  He feels full even after a small meal, complains of severe nausea and decreased appetite.  His bowel habits are irregular.  Overall he is having very low caloric intake ? ?Outpatient Encounter Medications as of 02/15/2022  ?Medication Sig  ? Acetaminophen 500 MG coapsule Take 500 mg by mouth every 4 (four) hours as needed for pain.  ? atorvastatin (LIPITOR) 20 MG tablet TAKE 1 TABLET BY MOUTH ONCE DAILY  ? cyclobenzaprine (FLEXERIL) 5 MG tablet Take 5 mg by mouth 3 (three) times daily as needed for muscle spasms.  ? digoxin (LANOXIN) 0.125 MG tablet TAKE 1 TABLET (0.125 MG TOTAL) BY MOUTH DAILY.  ? docusate sodium (COLACE) 100 MG capsule Take 100 mg by mouth daily.  ? famotidine (PEPCID) 20 MG tablet Take 1 tablet (20 mg total) by mouth at bedtime as needed for heartburn or indigestion.  ? furosemide (LASIX) 80 MG tablet Take 80 mg by mouth daily as needed for fluid or edema.  ? glimepiride (AMARYL) 4 MG tablet Take 1 tablet (4 mg total) by mouth 2 (two) times daily  ? metoprolol succinate (TOPROL-XL) 25 MG 24 hr  tablet Take 1 tablet (25 mg total) by mouth daily. Take with or immediately following a meal.  ? pantoprazole (PROTONIX) 40 MG tablet Take 1 tablet (40 mg total) by mouth 2 (two) times daily. Take one tablet 30-60 minutes before breakfast and dinner.  ? tiZANidine (ZANAFLEX) 4 MG tablet Take 1 tablet (4 mg total) by mouth every 6 (six) hours as needed for muscle spasms.  ? ?No facility-administered encounter medications on file as of 02/15/2022.  ? ? ?Allergies as of 02/15/2022  ? (No Known Allergies)  ? ? ?Past Medical History:  ?Diagnosis Date  ? Anemia   ? a. 10/2015: symptomatic; requiring transfusion, EGD with antral gastritis. Anemia panel more suggestive of AOCD.  ? Bacteroides fragilis 11/19/2015  ? Chronic osteomyelitis of right tibia ([place]) 11/19/2015  ? Chronic systolic CHF (congestive heart failure) ([place])   ? a. unable to tolerate lisinopril/ARB due to dizziness.   ? CKD (chronic kidney disease), stage II   ? Coronary artery disease 2012  ? a. s/p CABG - LIMA - LAD, SVG-OM2, SVG-PDA (11/2010). b. Abnl nuc 09/2015 - R/LHC 10/2015  R/LHC showed patent LIMA-LAD, and SVG-OM2, but occluded SVG-PDA; right heart pressures slightly elevated, otherwise no significant findings.  ? Diabetes mellitus 2003  ? Essential hypertension   ? Gastritis   ? Group B streptococcal infection 11/19/2015  ? Headache   ? Hyperlipidemia   ? Ischemic cardiomyopathy   ? Myocardial infarction [facility])   ?  Proliferative retinopathy 2015  ? treated with injection of Avastin.   ? Pseudomonas aeruginosa infection 11/19/2015  ? Serratia infection 11/19/2015  ? Shingles 2003  ? Shortness of breath dyspnea   ? Venous insufficiency   ? a. severe venous insufficiency with previous R leg ulcers requiring vein stripping.  ? ? ?Past Surgical History:  ?Procedure Laterality Date  ? APPENDECTOMY  1971  ? APPLICATION OF A-CELL OF EXTREMITY Right 10/24/2015  ? Procedure: APPLICATION OF A-CELL AND PLACEMENT OF WOUND VAC;  Surgeon: Loel Lofty Dillingham, DO;   Location: [place];  Service: Plastics;  Laterality: Right;  ? APPLICATION OF A-CELL OF EXTREMITY Right 01/29/2016  ? Procedure: APPLICATION OF A-CELL OF EXTREMITY;  Surgeon: Loel Lofty Dillingham, DO;  Location: [place];  Service: Plastics;  Laterality: Right;  ? APPLICATION OF A-CELL OF EXTREMITY Right 04/07/2016  ? Procedure: APPLICATION OF A-CELL OF EXTREMITY AND VAC DRESSING CHANGE;  Surgeon: Loel Lofty Dillingham, DO;  Location: [place];  Service: Plastics;  Laterality: Right;  ? APPLICATION OF WOUND VAC Right 01/29/2016  ? Procedure: APPLICATION OF WOUND VAC;  Surgeon: Loel Lofty Dillingham, DO;  Location: [place];  Service: Plastics;  Laterality: Right;  ? CARDIAC CATHETERIZATION N/A 10/22/2015  ? Procedure: Right/Left Heart Cath and Coronary/Graft Angiography;  Surgeon: Jolaine Artist, MD;  Location: [place] [facility];  Service: Cardiovascular;  Laterality: N/A;  ? COLONOSCOPY  10/2012  ? Dr April Manson in [place]. mall sigmoid tics, small internal hemorrhoids.  otherwise normal screening study.  no polyps, telangectasia.Marland Kitchen.  ? CORONARY ARTERY BYPASS GRAFT  11/05/2010  ? ESOPHAGOGASTRODUODENOSCOPY (EGD) WITH PROPOFOL N/A 10/21/2015  ? Procedure: ESOPHAGOGASTRODUODENOSCOPY (EGD) WITH PROPOFOL;  Surgeon: Mauri Pole, MD;  Location: [place] [facility];  Service: Endoscopy;  Laterality: N/A;  ? FRACTURE SURGERY    ?  wrist  ? I & D EXTREMITY Right 10/24/2015  ? Procedure: IRRIGATION AND DEBRIDEMENT EXTREMITY;  Surgeon: Loel Lofty Dillingham, DO;  Location: [place];  Service: Plastics;  Laterality: Right;  ? I & D EXTREMITY Right 01/29/2016  ? Procedure: RIGHT LEG WOUND IRRIGATION AND DEBRIDEMENT ;  Surgeon: Loel Lofty Dillingham, DO;  Location: [place];  Service: Plastics;  Laterality: Right;  ? REFRACTIVE SURGERY  2009, 2015  ? RIGHT/LEFT HEART CATH AND CORONARY/GRAFT ANGIOGRAPHY N/A 02/27/2021  ? Procedure: RIGHT/LEFT HEART CATH AND CORONARY/GRAFT ANGIOGRAPHY;  Surgeon: Jolaine Artist, MD;  Location: [place] [facility];   Service: Cardiovascular;  Laterality: N/A;  ? SKIN SPLIT GRAFT Right 04/07/2016  ? Procedure: SKIN GRAFT SPLIT THICKNESS TO RIGHT LOWER LEG;  Surgeon: Loel Lofty Dillingham, DO;  Location: [place];  Service: Plastics;  Laterality: Right;  ? [place]  ? right lower extremity  ? ? ?Family History  ?Problem Relation Age of Onset  ? Hypertension Father   ? Other Father   ?     Pacemaker  ? Parkinson's disease Father   ? Colon cancer Neg Hx   ? ? ?Social History  ? ?Socioeconomic History  ? Marital status: Married  ?  Spouse name: Not on file  ? Number of children: 2  ? Years of education: Not on file  ? Highest education level: Not on file  ?Occupational History  ? Occupation: Works at [employer].  ?Tobacco Use  ? Smoking status: Never  ? Smokeless tobacco: Never  ?Substance and Sexual Activity  ? Alcohol use: No  ? Drug use: No  ? Sexual activity: Not on file  ?  Other Topics Concern  ? Not on file  ?Social History Narrative  ? Not on file  ? ?Social Determinants of Health  ? ?Financial Resource Strain: Not on file  ?Food Insecurity: Not on file  ?Transportation Needs: Not on file  ?Physical Activity: Not on file  ?Stress: Not on file  ?Social Connections: Not on file  ?Intimate Partner Violence: Not on file  ? ? ? ? ?Review of systems: ?All other review of systems negative except as mentioned in the HPI. ? ? ?Physical Exam: ?Vitals:  ? 02/15/22 1055  ?BP: 106/68  ?Pulse: 89  ?SpO2: 93%  ? ?Body mass index is 15.04 kg/m?. ?Gen:      Short of breath even sitting, was extremely dyspneic after couple steps to the exam table, cachectic ?HEENT:  sclera anicteric ?Abd:      soft, non-tender; no palpable masses, no distension ?Ext:    No edema ?Neuro: alert and oriented x 3 ?Psych: normal mood and affect ? ?Data Reviewed: ? ?Reviewed labs, radiology imaging, old records and pertinent past GI work up ? ? ?Assessment and Plan/Recommendations: ? ?61 year old very pleasant gentleman with CKD, severe congestive heart  failure EF 20%, dyspnea at rest currently not on home O2 failure to thrive with progressive weight loss ? ?GI work-up in the past was unrevealing for any acute pathology ?Will obtain CT abdomen pelvis with contrast t

## 2022-02-15 NOTE — Patient Instructions (Signed)
Your provider has requested that you go to the basement level for lab work before leaving today. Press "B" on the elevator. The lab is located at the first door on the left as you exit the elevator.  ? ?You have been scheduled for a CT scan of the abdomen and pelvis at [facility], 1st floor Radiology. You are scheduled on _______  at ________. You should arrive 15 minutes prior to your appointment time for registration.  Please pick up 2 bottles of contrast from [place] at least 3 days prior to your scan. The solution may taste better if refrigerated, but do NOT add ice or any other liquid to this solution. Shake well before drinking.  ? ?Please follow the written instructions below on the day of your exam:  ? ?1) Do not eat anything after _______ (4 hours prior to your test)  ? ?2) Drink 1 bottle of contrast @ _______ (2 hours prior to your exam)  Remember to shake well before drinking and do NOT pour over ice. ?    Drink 1 bottle of contrast @ ________ (1 hour prior to your exam)  ? ?You may take any medications as prescribed with a small amount of water, if necessary. If you take any of the following medications: METFORMIN, GLUCOPHAGE, GLUCOVANCE, AVANDAMET, RIOMET, FORTAMET, ACTOPLUS MET, JANUMET, [place] or METAGLIP, you MAY be asked to HOLD this medication 48 hours AFTER the exam.  ? ?The purpose of you drinking the oral contrast is to aid in the visualization of your intestinal tract. The contrast solution may cause some diarrhea. Depending on your individual set of symptoms, you may also receive an intravenous injection of x-ray contrast/dye. Plan on being at [facility] for 45 minutes or longer, depending on the type of exam you are having performed.  ? ?If you have any questions regarding your exam or if you need to reschedule, you may call [facility] at [phone] between the hours of 8:00 am and 5:00 pm, Monday-Friday.  ? ?You will be contacted by [place] in the next 2 days to arrange a CT SCAN.  The number on your caller ID will be [phone], please answer when they call.  If you have not heard from them in 2 days please call [phone] to schedule.    ? ?  ?

## 2022-02-22 ENCOUNTER — Encounter: Payer: Self-pay | Admitting: Gastroenterology

## 2022-03-03 ENCOUNTER — Ambulatory Visit (HOSPITAL_COMMUNITY)
Admission: RE | Admit: 2022-03-03 | Discharge: 2022-03-03 | Disposition: A | Discharge status: Home / Self Care | Payer: Medicare HMO | Source: Ambulatory Visit | Attending: Gastroenterology | Admitting: Gastroenterology

## 2022-03-03 DIAGNOSIS — I700 Atherosclerosis of aorta: Secondary | ICD-10-CM | POA: Diagnosis not present

## 2022-03-03 DIAGNOSIS — I509 Heart failure, unspecified: Secondary | ICD-10-CM | POA: Diagnosis not present

## 2022-03-03 DIAGNOSIS — R0609 Other forms of dyspnea: Secondary | ICD-10-CM | POA: Insufficient documentation

## 2022-03-03 DIAGNOSIS — N200 Calculus of kidney: Secondary | ICD-10-CM | POA: Diagnosis not present

## 2022-03-03 DIAGNOSIS — R634 Abnormal weight loss: Secondary | ICD-10-CM | POA: Diagnosis not present

## 2022-03-03 DIAGNOSIS — K5900 Constipation, unspecified: Secondary | ICD-10-CM | POA: Diagnosis not present

## 2022-03-03 MED ORDER — IOHEXOL 300 MG/ML  SOLN
80.0000 mL | Freq: Once | INTRAMUSCULAR | Status: AC | PRN
  Administered 2022-03-03: 80 mL via INTRAVENOUS

## 2022-03-09 ENCOUNTER — Telehealth: Payer: Self-pay

## 2022-03-09 NOTE — Telephone Encounter (Signed)
? ?  Name: Vincent Brown  ?DOB: 06-May-1961  ?MRN: [id number] ? ?Primary Cardiologist: Dr. Gala Romney of [facility] ? ?Chart reviewed as part of pre-operative protocol coverage. Because of Vincent Brown's past medical history and time since last visit, he will require a follow-up in-office visit in order to better assess preoperative cardiovascular risk. ? ?Pre-op covering staff: ?- Please schedule appointment and call patient to inform them. If patient already had an upcoming appointment within acceptable timeframe, please add "pre-op clearance" to the appointment notes so provider is aware. ?- Please contact requesting surgeon's office via preferred method (i.e, phone, fax) to inform them of need for appointment prior to surgery. ? ? ?Azalee Course, [place]  ?03/09/2022, 7:05 PM  ? ? ?

## 2022-03-09 NOTE — Telephone Encounter (Signed)
 [facility] Pre-operative Risk Assessment  ?   ?Request for surgical clearance:     Endoscopy Procedure ? ?What type of surgery is being performed?     EGD ? ?When is this surgery scheduled?     03/22/22 ? ?What type of clearance is required ?   Medical ? ?Are there any medications that need to be held prior to surgery and how long? N/A ? ?Practice name and name of physician performing surgery?      [facility] ? ?What is your office phone and fax number?      Phone- [phone]  Fax- [phone] ? ?Anesthesia type (None, local, MAC, general) ?       MAC  ?

## 2022-03-10 NOTE — Telephone Encounter (Signed)
Thank you :)

## 2022-03-10 NOTE — Telephone Encounter (Signed)
I will send a message to our [facility] for pt to be scheduled for an appt for pre op clearance, see notes from Azalee Course, [facility].  ? ?I will send FYI to requesting office the pt will nee an appt.  ?

## 2022-03-10 NOTE — Telephone Encounter (Signed)
Dr Nandigam FYI 

## 2022-03-14 ENCOUNTER — Encounter: Payer: Self-pay | Admitting: Gastroenterology

## 2022-03-15 ENCOUNTER — Encounter (HOSPITAL_COMMUNITY): Payer: Self-pay | Admitting: Gastroenterology

## 2022-03-17 ENCOUNTER — Ambulatory Visit (HOSPITAL_COMMUNITY)
Admission: RE | Admit: 2022-03-17 | Discharge: 2022-03-17 | Disposition: A | Discharge status: Home / Self Care | Payer: Medicare HMO | Source: Ambulatory Visit | Attending: Internal Medicine | Admitting: Internal Medicine

## 2022-03-17 ENCOUNTER — Encounter (HOSPITAL_COMMUNITY): Payer: Self-pay | Admitting: Internal Medicine

## 2022-03-17 VITALS — BP 108/60 | HR 92 | Wt 104.6 lb

## 2022-03-17 DIAGNOSIS — Z79899 Other long term (current) drug therapy: Secondary | ICD-10-CM | POA: Insufficient documentation

## 2022-03-17 DIAGNOSIS — E875 Hyperkalemia: Secondary | ICD-10-CM | POA: Insufficient documentation

## 2022-03-17 DIAGNOSIS — Z7982 Long term (current) use of aspirin: Secondary | ICD-10-CM | POA: Diagnosis not present

## 2022-03-17 DIAGNOSIS — I428 Other cardiomyopathies: Secondary | ICD-10-CM | POA: Diagnosis not present

## 2022-03-17 DIAGNOSIS — N182 Chronic kidney disease, stage 2 (mild): Secondary | ICD-10-CM | POA: Insufficient documentation

## 2022-03-17 DIAGNOSIS — E1122 Type 2 diabetes mellitus with diabetic chronic kidney disease: Secondary | ICD-10-CM | POA: Insufficient documentation

## 2022-03-17 DIAGNOSIS — I5022 Chronic systolic (congestive) heart failure: Secondary | ICD-10-CM

## 2022-03-17 DIAGNOSIS — Z7902 Long term (current) use of antithrombotics/antiplatelets: Secondary | ICD-10-CM | POA: Insufficient documentation

## 2022-03-17 DIAGNOSIS — I130 Hypertensive heart and chronic kidney disease with heart failure and stage 1 through stage 4 chronic kidney disease, or unspecified chronic kidney disease: Secondary | ICD-10-CM | POA: Diagnosis not present

## 2022-03-17 DIAGNOSIS — I2510 Atherosclerotic heart disease of native coronary artery without angina pectoris: Secondary | ICD-10-CM

## 2022-03-17 DIAGNOSIS — Z951 Presence of aortocoronary bypass graft: Secondary | ICD-10-CM | POA: Insufficient documentation

## 2022-03-17 NOTE — Patient Instructions (Signed)
Stop Toprol. ? ?Your physician recommends that you schedule a follow-up appointment in: 3 months. ? ?If you have any questions or concerns before your next appointment please send [place] a message through [place] or call our office at [phone].   ? ?TO LEAVE A MESSAGE FOR THE NURSE SELECT OPTION 2, PLEASE LEAVE A MESSAGE INCLUDING: ?YOUR NAME ?DATE OF BIRTH ?CALL BACK NUMBER ?REASON FOR CALL**this is important as we prioritize the call backs ? ?YOU WILL RECEIVE A CALL BACK THE SAME DAY AS LONG AS YOU CALL BEFORE 4:00 PM ? ?At the [facility], you and your health needs are our priority. As part of our continuing mission to provide you with exceptional heart care, we have created designated Provider Care Teams. These Care Teams include your primary Cardiologist (physician) and Advanced Practice Providers (APPs- Physician Assistants and Nurse Practitioners) who all work together to provide you with the care you need, when you need it.  ? ?You may see any of the following providers on your designated Care Team at your next follow up: ?Dr Arvilla Meres ?Dr Marca Ancona ?Tonye Becket, NP ?Robbie Lis, PA ?Jessica Milford,NP ?Anna Genre, PA ?Karle Plumber, PharmD ? ? ?Please be sure to bring in all your medications bottles to every appointment.  ? ? ?

## 2022-03-17 NOTE — Progress Notes (Signed)
Patient ID: Vincent Brown, male   DOB: 05-May-1961, 61 y.o.   MRN: [id number] ?  ? ?[facility] ? ?Date:  03/17/2022  ?Patient ID:  Vincent Brown, DOB June 07, 1961, MRN [id number] ?PCP:  Juluis Pitch, MD  ?Cardiologist:  DBensimhon (CHF clinic) ?  ? ?Subjective: ?Vincent Brown is a 61 y.o. 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 ? ?Had a NSTEMI in 1/12 after presenting with bad cough. Had cath at [facility]. LAD 100% LCX 70% RCA p70%, d100%. EF 25%. Cardiac MRI: EF 15% anterior wall and inferior wall infarct with significant viability -> CABG ? ?Unable to tolerate lisinopril or losartan due to dizziness. Spironolactone stopped due to hyperkalemia. Unable to tolerate Jardiance so stopped. Refuses ICD.  ? ?In 2017 had RLE chronic wound and MRI was suggestive of osteomyelitis. Vascular was consulted and felt AKA was the best option but the patient was not willing to have this at this time. Plastic surgery was consulted and performed debridement/wound vac. Healed completely.  ? ?Echo 02/16/21: EF 15% RV mild to moderately down  ? ?Cath 4/22 ? ?Ao = 113/60 (81) ?LV = 115/20 ?RA = 3 ?RV = 36/1 ?PA = 40/15 (25) ?PCW = N/A ?Fick cardiac output/index = 5.9/3.3 ?PVR =  0.9 WU ?SVR = 1,064 ?ao sat = 99% ?PA sat = 74%, 73% ?High SVC = 73% ?  ?Assessment: ?1. Stable CAD with patent grafts without change from prior ?2. Severe iCM EF 15-20% ?3. Mildly elevated filling pressures with normal cardiac output ? ? ?Today he returns for HF follow up with his wife. Over the past year has been having progressive HF symptoms. NYHA IIIB. Says he ain't getting no better. Wife says he is worse. He is more SOB struggling to get around. Using a wheelchair.  Very weak. Denies CP. Says he is SOB with just taling. He feels full even after a small  meal, complains of severe nausea and decreased appetite.  His bowel habits are irregular. Constipated. Not eating much. Has seen GI. Considering EGD but I felt too high risk for sedation.  ? ?ECHO 12/2011: EF 25-30% ?ECHO 03/14/13 EF 25% ?ECHO 12/15: EF 25%, diffuse hypokinesis, normal RV size, mildly decreased RV systolic function, PA systolic pressure 40 mmHg ? ? ? ?Past Medical History:  ?Diagnosis Date  ? Anemia   ? a. 10/2015: symptomatic; requiring transfusion, EGD with antral gastritis. Anemia panel more suggestive of AOCD.  ? Bacteroides fragilis 11/19/2015  ? Chronic osteomyelitis of right tibia ([place]) 11/19/2015  ? Chronic systolic CHF (congestive heart failure) ([place])   ? a. unable to tolerate lisinopril/ARB due to dizziness.   ? CKD (chronic kidney disease), stage II   ? Coronary artery disease 2012  ? a. s/p CABG - LIMA - LAD, SVG-OM2, SVG-PDA (11/2010). b. Abnl nuc 09/2015 - R/LHC 10/2015  R/LHC showed patent LIMA-LAD, and SVG-OM2, but occluded SVG-PDA; right heart pressures slightly elevated, otherwise no significant findings.  ? Diabetes mellitus 2003  ? Essential hypertension   ? Gastritis   ? Group B streptococcal infection 11/19/2015  ? Headache   ? Hyperlipidemia   ? Ischemic cardiomyopathy   ? Myocardial infarction [facility])   ? Proliferative retinopathy 2015  ? treated with injection of Avastin.   ? Pseudomonas aeruginosa infection 11/19/2015  ? Serratia  infection 11/19/2015  ? Shingles 2003  ? Shortness of breath dyspnea   ? Venous insufficiency   ? a. severe venous insufficiency with previous R leg ulcers requiring vein stripping.  ? ? ?Past Surgical History:  ?Procedure Laterality Date  ? APPENDECTOMY  1971  ? APPLICATION OF A-CELL OF EXTREMITY Right 10/24/2015  ? Procedure: APPLICATION OF A-CELL AND PLACEMENT OF WOUND VAC;  Surgeon: Loel Lofty Dillingham, DO;  Location: [place];  Service: Plastics;  Laterality: Right;  ? APPLICATION OF A-CELL OF EXTREMITY Right 01/29/2016  ? Procedure: APPLICATION OF A-CELL  OF EXTREMITY;  Surgeon: Loel Lofty Dillingham, DO;  Location: [place];  Service: Plastics;  Laterality: Right;  ? APPLICATION OF A-CELL OF EXTREMITY Right 04/07/2016  ? Procedure: APPLICATION OF A-CELL OF EXTREMITY AND VAC DRESSING CHANGE;  Surgeon: Loel Lofty Dillingham, DO;  Location: [place];  Service: Plastics;  Laterality: Right;  ? APPLICATION OF WOUND VAC Right 01/29/2016  ? Procedure: APPLICATION OF WOUND VAC;  Surgeon: Loel Lofty Dillingham, DO;  Location: [place];  Service: Plastics;  Laterality: Right;  ? CARDIAC CATHETERIZATION N/A 10/22/2015  ? Procedure: Right/Left Heart Cath and Coronary/Graft Angiography;  Surgeon: Jolaine Artist, MD;  Location: [place] [facility];  Service: Cardiovascular;  Laterality: N/A;  ? COLONOSCOPY  10/2012  ? Dr April Manson in [place]. mall sigmoid tics, small internal hemorrhoids.  otherwise normal screening study.  no polyps, telangectasia.Marland Kitchen.  ? CORONARY ARTERY BYPASS GRAFT  11/05/2010  ? ESOPHAGOGASTRODUODENOSCOPY (EGD) WITH PROPOFOL N/A 10/21/2015  ? Procedure: ESOPHAGOGASTRODUODENOSCOPY (EGD) WITH PROPOFOL;  Surgeon: Mauri Pole, MD;  Location: [place] [facility];  Service: Endoscopy;  Laterality: N/A;  ? FRACTURE SURGERY    ?  wrist  ? I & D EXTREMITY Right 10/24/2015  ? Procedure: IRRIGATION AND DEBRIDEMENT EXTREMITY;  Surgeon: Loel Lofty Dillingham, DO;  Location: [place];  Service: Plastics;  Laterality: Right;  ? I & D EXTREMITY Right 01/29/2016  ? Procedure: RIGHT LEG WOUND IRRIGATION AND DEBRIDEMENT ;  Surgeon: Loel Lofty Dillingham, DO;  Location: [place];  Service: Plastics;  Laterality: Right;  ? REFRACTIVE SURGERY  2009, 2015  ? RIGHT/LEFT HEART CATH AND CORONARY/GRAFT ANGIOGRAPHY N/A 02/27/2021  ? Procedure: RIGHT/LEFT HEART CATH AND CORONARY/GRAFT ANGIOGRAPHY;  Surgeon: Jolaine Artist, MD;  Location: [place] [facility];  Service: Cardiovascular;  Laterality: N/A;  ? SKIN SPLIT GRAFT Right 04/07/2016  ? Procedure: SKIN GRAFT SPLIT THICKNESS TO RIGHT LOWER LEG;  Surgeon:  Loel Lofty Dillingham, DO;  Location: [place];  Service: Plastics;  Laterality: Right;  ? [place]  ? right lower extremity  ? ? ?Current Outpatient Medications  ?Medication Sig Dispense Refill  ? acetaminophen (TYLENOL) 650 MG CR tablet Take 650 mg by mouth every 8 (eight) hours as needed for pain.    ? atorvastatin (LIPITOR) 20 MG tablet TAKE 1 TABLET BY MOUTH ONCE DAILY 90 tablet 1  ? cyclobenzaprine (FLEXERIL) 5 MG tablet Take 5 mg by mouth 3 (three) times daily as needed for muscle spasms.    ? digoxin (LANOXIN) 0.125 MG tablet TAKE 1 TABLET (0.125 MG TOTAL) BY MOUTH DAILY. 30 tablet 11  ? famotidine (PEPCID) 20 MG tablet Take 1 tablet (20 mg total) by mouth at bedtime as needed for heartburn or indigestion. 30 tablet 2  ? furosemide (LASIX) 80 MG tablet Take 80 mg by mouth as needed.    ? glimepiride (AMARYL) 4 MG tablet Take 1 tablet (4 mg total) by mouth  2 (two) times daily 180 tablet 1  ? metoprolol succinate (TOPROL-XL) 25 MG 24 hr tablet Take 1 tablet (25 mg total) by mouth daily. Take with or immediately following a meal. 30 tablet 11  ? pantoprazole (PROTONIX) 20 MG tablet Take 20 mg by mouth 2 (two) times daily before a meal.    ? polyethylene glycol (MIRALAX / GLYCOLAX) 17 g packet Take 17 g by mouth daily.    ? tiZANidine (ZANAFLEX) 4 MG tablet Take 1 tablet (4 mg total) by mouth every 6 (six) hours as needed for muscle spasms. 30 tablet 1  ? ?No current facility-administered medications for this encounter.  ? ? ?Allergies:   Patient has no known allergies.  ? ?Social History:  The patient  reports that he has never smoked. He has never used smokeless tobacco. He reports that he does not drink alcohol and does not use drugs.  ? ?Family History:  The patient's family history includes Hypertension in his father; Other in his father and paternal aunt; Parkinson's disease in his father. ? ?ROS:  Please see the history of present illness.    ?All other systems are reviewed and otherwise  negative.  ? ?Wt Readings from Last 3 Encounters:  ?03/17/22 47.4 kg (104 lb 9.6 oz)  ?02/15/22 47.5 kg (104 lb 12.8 oz)  ?10/20/21 53.4 kg (117 lb 12.8 oz)  ? ?Vitals:  ? 03/17/22 1440  ?BP: 108/60  ?Pulse: 92

## 2022-03-18 ENCOUNTER — Telehealth: Payer: Self-pay

## 2022-03-18 NOTE — Telephone Encounter (Signed)
Patient had his cardiology visit yesterday. This is in the note.   4. GI symptoms - likely due to end-stage HF - I think EGD would be high-risk and not provide any benefit   Glori Bickers MD 3:20 PM   Vincent Brown sent me a My Chart message to let me know the patient will not be having the EGD. I will cancel his procedure that is scheduled for 03/22/22 at [place].

## 2022-03-18 NOTE — Telephone Encounter (Signed)
Ok, Thank you for letting me know.

## 2022-03-22 ENCOUNTER — Ambulatory Visit (HOSPITAL_COMMUNITY): Admission: RE | Admit: 2022-03-22 | Payer: Medicare HMO | Source: Home / Self Care | Admitting: Gastroenterology

## 2022-03-22 SURGERY — ESOPHAGOGASTRODUODENOSCOPY (EGD) WITH PROPOFOL
Anesthesia: Monitor Anesthesia Care

## 2022-05-29 ENCOUNTER — Telehealth: Payer: Self-pay | Admitting: Physician Assistant

## 2022-05-29 NOTE — Telephone Encounter (Signed)
   Police officer Lissa Hoard called because pt found dead in a hotel room.   Detective is on the case, M.E. as well.  I was able to confirm his cardiac hx and CHF hx for them.   They will contact us if they need additional information.   Theodore Demark, PA-C 06/07/2022 2:40 PM

## 2022-06-01 DEATH — deceased

## 2022-06-22 ENCOUNTER — Encounter (HOSPITAL_COMMUNITY): Payer: Medicare HMO | Admitting: Internal Medicine

## 2022-10-07 IMAGING — CT CT ABD-PELV W/ CM
2 of 4 series · 12 of 36 positions shown, 17 images · IV contrast (OMNIPAQUE 300)
Comparison: None.

CLINICAL DATA: Right-sided abdominal pain since [REDACTED]. History
of appendectomy.

EXAM:
CT ABDOMEN AND PELVIS WITH CONTRAST
TECHNIQUE: Multidetector CT imaging of the abdomen and pelvis was performed
using the standard protocol following bolus administration of
intravenous contrast.
CONTRAST:  80mL OMNIPAQUE IOHEXOL 300 MG/ML  SOLN

[Series 2: abd/pel w · axial · 0.62mm/px · z∈[-526,-151]mm · 11 of 90 slices shown, 15 images]
[im 10/90  soft-tissue]
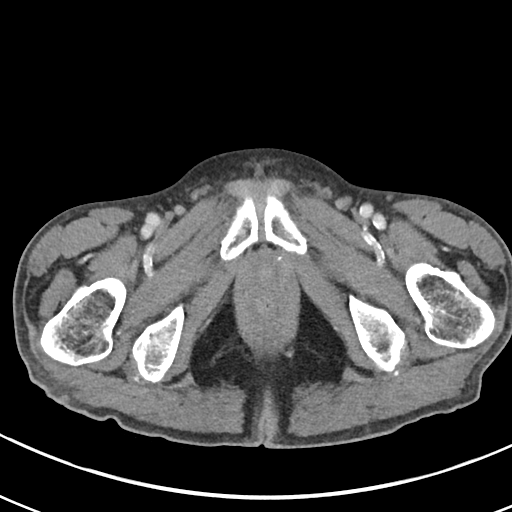
[im 10/90  bone]
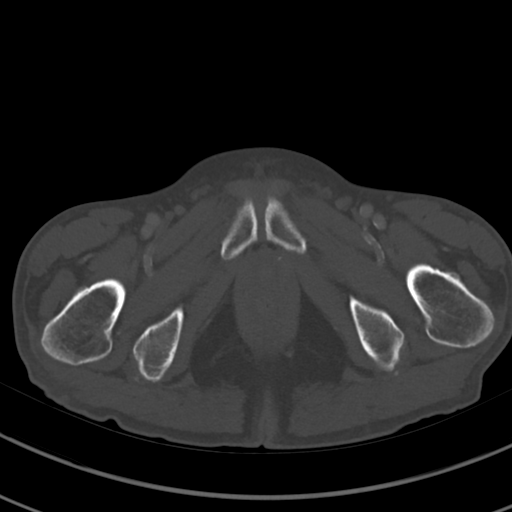
[im 19/90  soft-tissue]
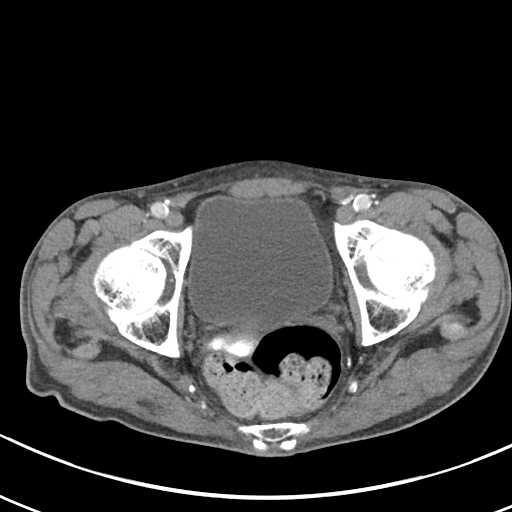
[im 29/90  soft-tissue]
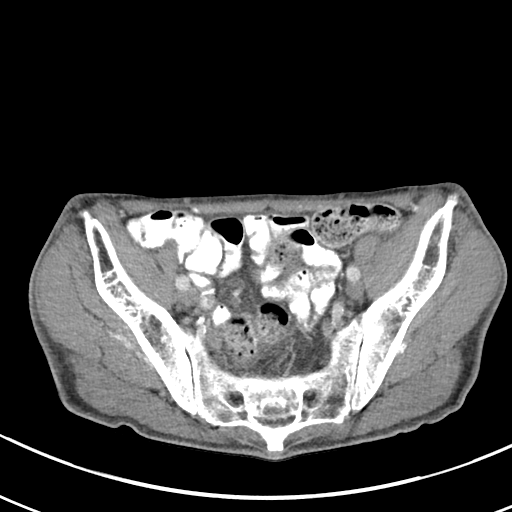
[im 38/90  soft-tissue]
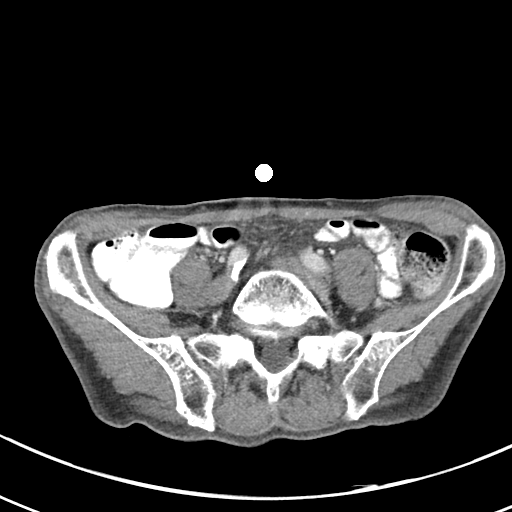
[im 47/90  soft-tissue]
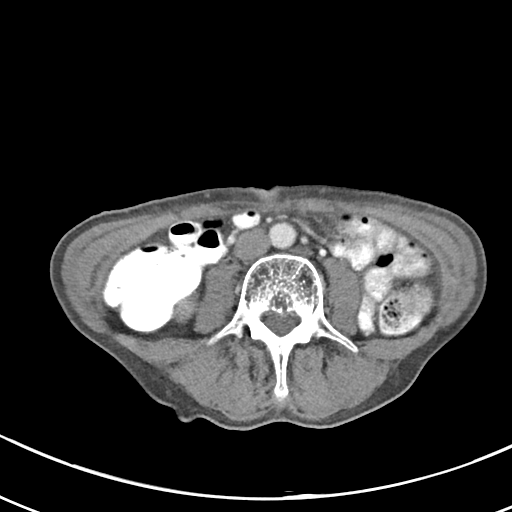
[im 57/90  soft-tissue]
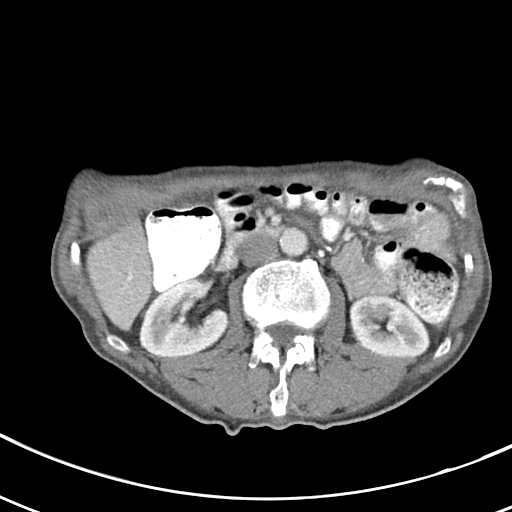
[im 66/90  soft-tissue]
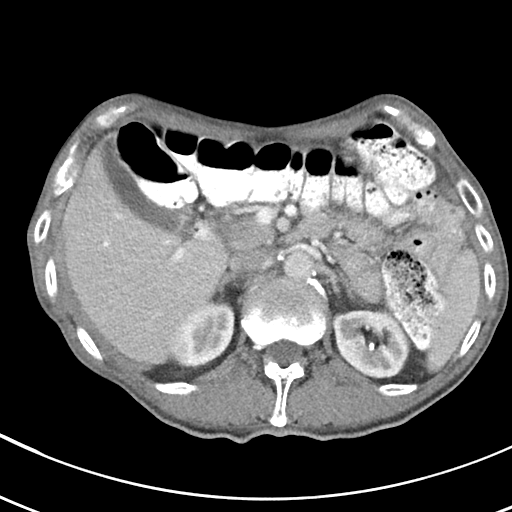
[im 71/90  lung]
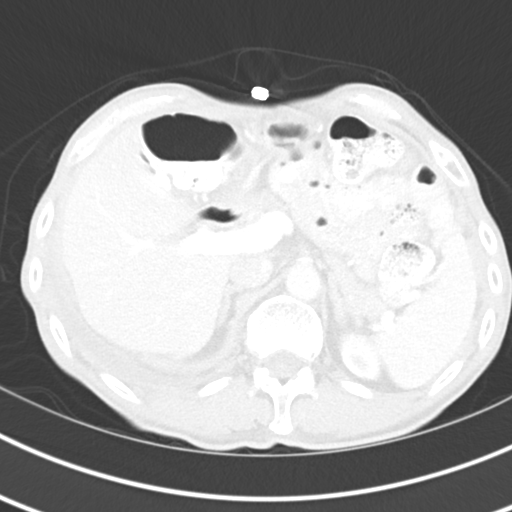
[im 75/90  soft-tissue]
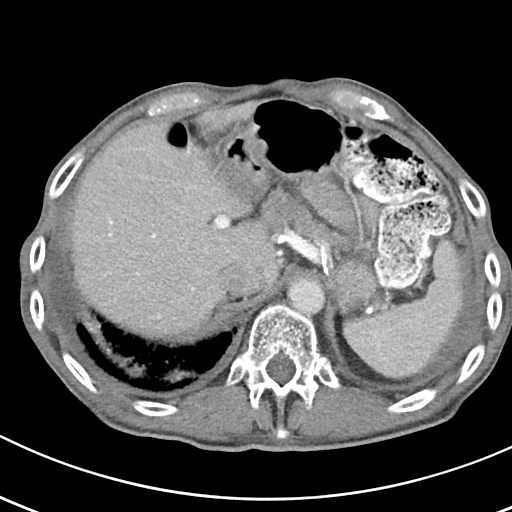
[im 75/90  lung]
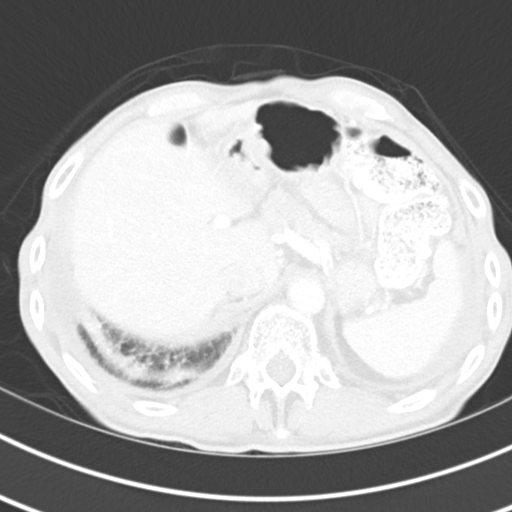
[im 80/90  lung]
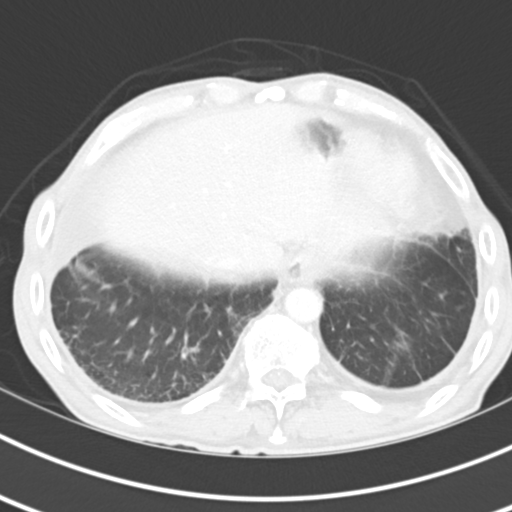
[im 85/90  soft-tissue]
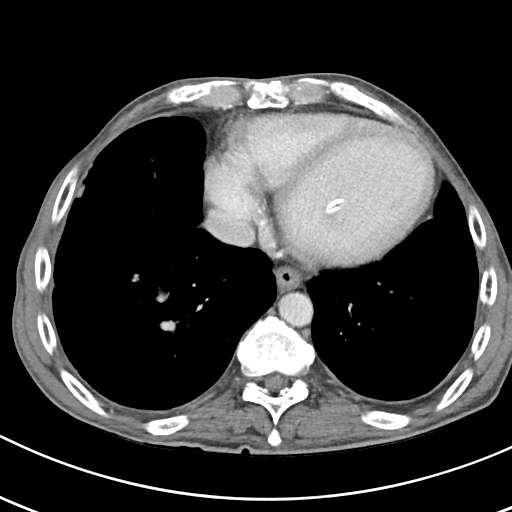
[im 85/90  lung]
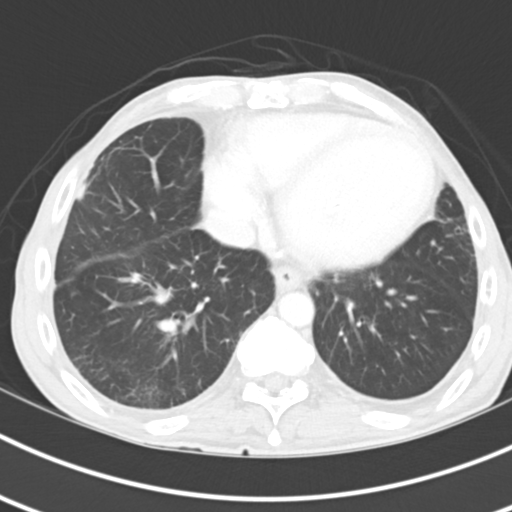
[im 85/90  bone]
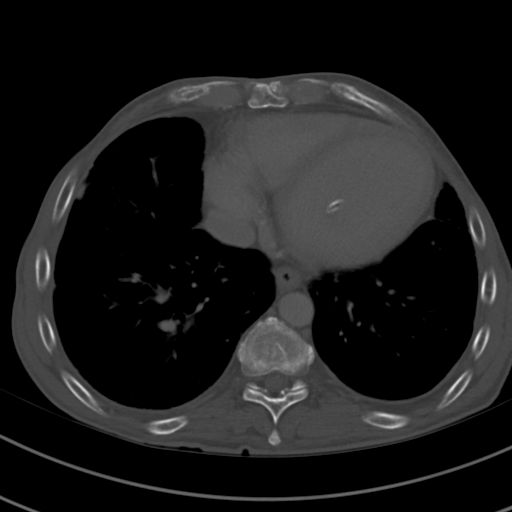

[Series 6: sagittal st · sagittal · 0.45mm/px · 1 of 106 slices shown, 2 images]
[im 36/106  soft-tissue]
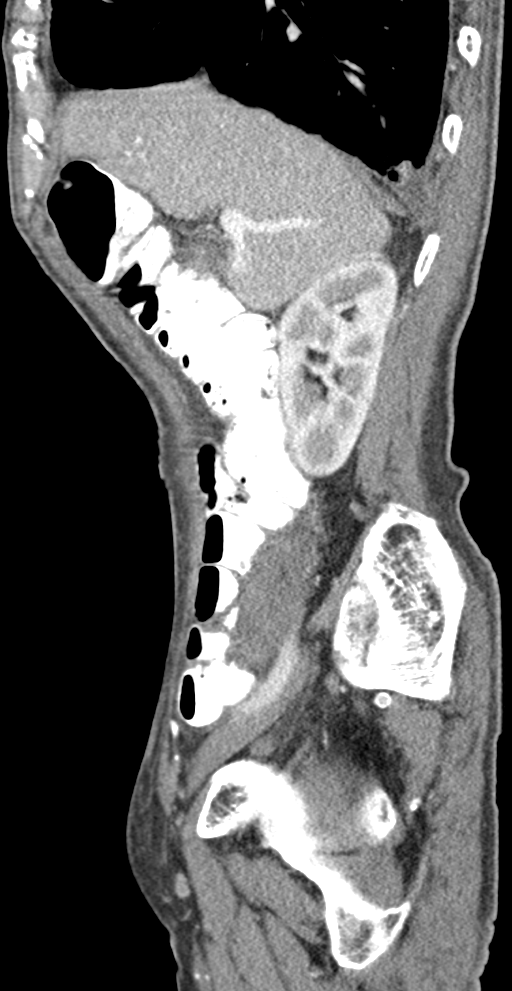
[im 36/106  bone]
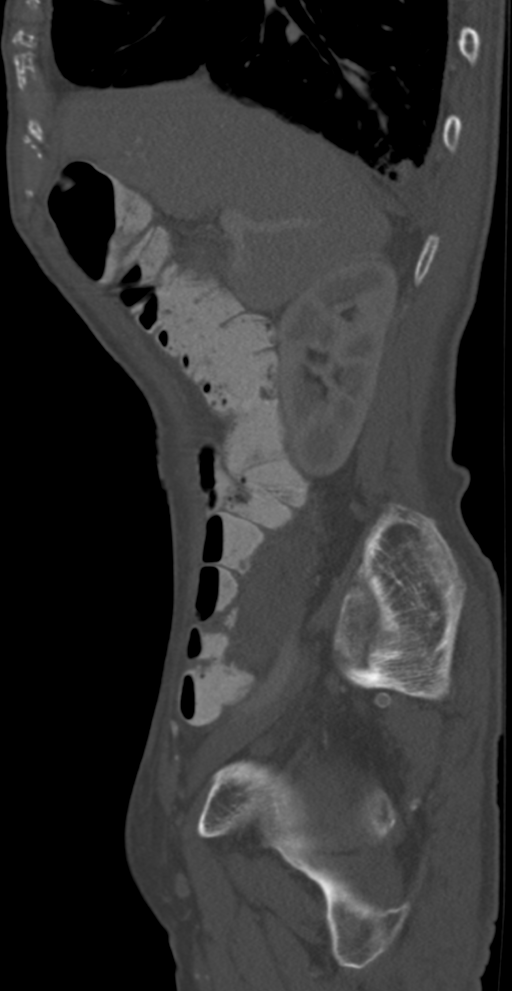

[12 of 36 positions shown; findings below may reference images not displayed]

FINDINGS: Lower chest: There are small bilateral pleural effusions with
associated atelectasis.

Hepatobiliary: No focal liver abnormality is seen. No gallstones,
gallbladder wall thickening, or biliary dilatation.

Pancreas: Unremarkable. No pancreatic ductal dilatation or
surrounding inflammatory changes.

Spleen: Normal in size without focal abnormality.

Adrenals/Urinary Tract: Adrenal glands are unremarkable. A 5 mm
nonobstructive left renal calculus is noted. No right renal calculi.
No focal renal lesion or hydronephrosis on either side. Bladder is
unremarkable.

Stomach/Bowel: Enteric contrast reaches the descending colon.
Stomach is within normal limits. Appendix appears normal. No
evidence of bowel wall thickening, distention, or inflammatory
changes.

Vascular/Lymphatic: Aortic atherosclerosis. No enlarged abdominal or
pelvic lymph nodes.

Reproductive: Prostate is unremarkable.

Other: No abdominal wall hernia or abnormality. No abdominopelvic
ascites.

Musculoskeletal: There is osseous demineralization. Degenerative
changes are seen in the spine.
IMPRESSION: 1. Small bilateral pleural effusions with associated atelectasis.
Otherwise, no findings to explain abdominal pain.
